# Patient Record
Sex: Female | Born: 1965 | Race: White | Hispanic: No | Marital: Married | Smoking: Never smoker
Health system: Southern US, Community
[De-identification: ages and names within clinical notes are randomized; demographics above are authoritative.]

## PROBLEM LIST (undated history)

## (undated) DIAGNOSIS — E039 Hypothyroidism, unspecified: Secondary | ICD-10-CM

## (undated) DIAGNOSIS — R112 Nausea with vomiting, unspecified: Secondary | ICD-10-CM

## (undated) DIAGNOSIS — G4733 Obstructive sleep apnea (adult) (pediatric): Secondary | ICD-10-CM

## (undated) DIAGNOSIS — E063 Autoimmune thyroiditis: Secondary | ICD-10-CM

## (undated) DIAGNOSIS — Z9889 Other specified postprocedural states: Secondary | ICD-10-CM

## (undated) DIAGNOSIS — R519 Headache, unspecified: Secondary | ICD-10-CM

## (undated) DIAGNOSIS — R51 Headache: Secondary | ICD-10-CM

## (undated) DIAGNOSIS — I10 Essential (primary) hypertension: Secondary | ICD-10-CM

## (undated) DIAGNOSIS — A4902 Methicillin resistant Staphylococcus aureus infection, unspecified site: Secondary | ICD-10-CM

## (undated) DIAGNOSIS — K219 Gastro-esophageal reflux disease without esophagitis: Secondary | ICD-10-CM

## (undated) HISTORY — DX: Hypothyroidism, unspecified: E03.9

## (undated) HISTORY — DX: Obstructive sleep apnea (adult) (pediatric): G47.33

## (undated) HISTORY — DX: Headache, unspecified: R51.9

## (undated) HISTORY — PX: TONSILLECTOMY: SUR1361

## (undated) HISTORY — DX: Headache: R51

## (undated) HISTORY — DX: Gastro-esophageal reflux disease without esophagitis: K21.9

## (undated) HISTORY — DX: Essential (primary) hypertension: I10

## (undated) HISTORY — DX: Methicillin resistant Staphylococcus aureus infection, unspecified site: A49.02

---

## 2003-07-08 HISTORY — PX: OTHER SURGICAL HISTORY: SHX169

## 2015-06-04 ENCOUNTER — Other Ambulatory Visit (HOSPITAL_BASED_OUTPATIENT_CLINIC_OR_DEPARTMENT_OTHER): Payer: Self-pay | Admitting: Physical Medicine and Rehabilitation

## 2015-06-04 DIAGNOSIS — M533 Sacrococcygeal disorders, not elsewhere classified: Secondary | ICD-10-CM

## 2015-06-20 ENCOUNTER — Ambulatory Visit (HOSPITAL_COMMUNITY)
Admission: RE | Admit: 2015-06-20 | Discharge: 2015-06-20 | Disposition: A | Discharge status: Home / Self Care | Payer: 59 | Source: Ambulatory Visit | Attending: Physical Medicine and Rehabilitation | Admitting: Physical Medicine and Rehabilitation

## 2015-06-20 DIAGNOSIS — M533 Sacrococcygeal disorders, not elsewhere classified: Secondary | ICD-10-CM | POA: Diagnosis not present

## 2015-06-20 DIAGNOSIS — M899 Disorder of bone, unspecified: Secondary | ICD-10-CM | POA: Diagnosis not present

## 2015-07-26 DIAGNOSIS — S8002XA Contusion of left knee, initial encounter: Secondary | ICD-10-CM | POA: Diagnosis not present

## 2015-08-01 ENCOUNTER — Other Ambulatory Visit (HOSPITAL_COMMUNITY): Payer: Self-pay | Admitting: Sports Medicine

## 2015-08-01 DIAGNOSIS — M25562 Pain in left knee: Secondary | ICD-10-CM

## 2015-08-02 ENCOUNTER — Ambulatory Visit (HOSPITAL_COMMUNITY)
Admission: RE | Admit: 2015-08-02 | Discharge: 2015-08-02 | Disposition: A | Discharge status: Home / Self Care | Payer: 59 | Source: Ambulatory Visit | Attending: Sports Medicine | Admitting: Sports Medicine

## 2015-08-03 DIAGNOSIS — W108XXA Fall (on) (from) other stairs and steps, initial encounter: Secondary | ICD-10-CM | POA: Diagnosis not present

## 2015-08-03 DIAGNOSIS — M79605 Pain in left leg: Secondary | ICD-10-CM | POA: Diagnosis not present

## 2015-08-03 DIAGNOSIS — M1612 Unilateral primary osteoarthritis, left hip: Secondary | ICD-10-CM | POA: Diagnosis not present

## 2015-08-03 DIAGNOSIS — M25562 Pain in left knee: Secondary | ICD-10-CM | POA: Diagnosis not present

## 2015-08-03 DIAGNOSIS — Z9889 Other specified postprocedural states: Secondary | ICD-10-CM | POA: Diagnosis not present

## 2015-08-03 DIAGNOSIS — W108XXD Fall (on) (from) other stairs and steps, subsequent encounter: Secondary | ICD-10-CM | POA: Diagnosis not present

## 2015-08-03 DIAGNOSIS — E079 Disorder of thyroid, unspecified: Secondary | ICD-10-CM | POA: Diagnosis not present

## 2015-08-03 DIAGNOSIS — M25561 Pain in right knee: Secondary | ICD-10-CM | POA: Diagnosis not present

## 2015-08-03 DIAGNOSIS — S8002XD Contusion of left knee, subsequent encounter: Secondary | ICD-10-CM | POA: Diagnosis not present

## 2015-08-03 DIAGNOSIS — S8002XA Contusion of left knee, initial encounter: Secondary | ICD-10-CM | POA: Diagnosis not present

## 2015-08-06 DIAGNOSIS — S83412A Sprain of medial collateral ligament of left knee, initial encounter: Secondary | ICD-10-CM | POA: Diagnosis not present

## 2015-08-06 DIAGNOSIS — M25561 Pain in right knee: Secondary | ICD-10-CM | POA: Diagnosis not present

## 2015-08-06 DIAGNOSIS — G8929 Other chronic pain: Secondary | ICD-10-CM | POA: Diagnosis not present

## 2015-08-06 DIAGNOSIS — M2392 Unspecified internal derangement of left knee: Secondary | ICD-10-CM | POA: Diagnosis not present

## 2015-08-06 DIAGNOSIS — M25562 Pain in left knee: Secondary | ICD-10-CM | POA: Diagnosis not present

## 2015-08-14 ENCOUNTER — Ambulatory Visit (HOSPITAL_COMMUNITY): Payer: 59

## 2015-09-07 DIAGNOSIS — M533 Sacrococcygeal disorders, not elsewhere classified: Secondary | ICD-10-CM | POA: Diagnosis not present

## 2015-10-04 MED FILL — SYNTHROID 175 MCG TABLET: 175 | 90 days supply | Qty: 45 | Fill #2

## 2015-10-04 MED FILL — SYNTHROID 200 MCG TABLET: 200 | 90 days supply | Qty: 45 | Fill #2

## 2015-10-08 ENCOUNTER — Other Ambulatory Visit (HOSPITAL_COMMUNITY): Payer: Self-pay | Admitting: Physical Medicine and Rehabilitation

## 2015-10-08 DIAGNOSIS — M533 Sacrococcygeal disorders, not elsewhere classified: Secondary | ICD-10-CM

## 2015-10-19 ENCOUNTER — Ambulatory Visit (HOSPITAL_COMMUNITY): Payer: 59

## 2015-10-26 ENCOUNTER — Ambulatory Visit (HOSPITAL_COMMUNITY)
Admission: RE | Admit: 2015-10-26 | Discharge: 2015-10-26 | Disposition: A | Discharge status: Home / Self Care | Payer: 59 | Source: Ambulatory Visit | Attending: Physical Medicine and Rehabilitation | Admitting: Physical Medicine and Rehabilitation

## 2015-10-26 DIAGNOSIS — M533 Sacrococcygeal disorders, not elsewhere classified: Secondary | ICD-10-CM | POA: Insufficient documentation

## 2015-10-26 MED ORDER — GADOBENATE DIMEGLUMINE 529 MG/ML IV SOLN
20.0000 mL | Freq: Once | INTRAVENOUS | Status: AC | PRN
  Administered 2015-10-26: 20 mL via INTRAVENOUS

## 2015-12-31 MED FILL — SYNTHROID 200 MCG TABLET: 200 | 90 days supply | Qty: 45 | Fill #3

## 2015-12-31 MED FILL — SYNTHROID 175 MCG TABLET: 175 | 90 days supply | Qty: 45 | Fill #3

## 2016-01-29 DIAGNOSIS — L089 Local infection of the skin and subcutaneous tissue, unspecified: Secondary | ICD-10-CM | POA: Diagnosis not present

## 2016-05-26 DIAGNOSIS — E039 Hypothyroidism, unspecified: Secondary | ICD-10-CM | POA: Diagnosis not present

## 2016-05-26 DIAGNOSIS — Z131 Encounter for screening for diabetes mellitus: Secondary | ICD-10-CM | POA: Diagnosis not present

## 2016-05-26 DIAGNOSIS — F458 Other somatoform disorders: Secondary | ICD-10-CM | POA: Diagnosis not present

## 2016-05-26 MED FILL — SYNTHROID 175 MCG TABLET: 175 | 30 days supply | Qty: 15 | Fill #0

## 2016-05-26 MED FILL — SYNTHROID 200 MCG TABLET: 200 | 30 days supply | Qty: 15 | Fill #0

## 2016-06-19 MED FILL — SYNTHROID 150 MCG TABLET: 150 | 30 days supply | Qty: 15 | Fill #0

## 2016-07-30 MED FILL — SYNTHROID 150 MCG TABLET: 150 | 30 days supply | Qty: 15 | Fill #1

## 2016-08-01 ENCOUNTER — Other Ambulatory Visit: Payer: Self-pay | Admitting: Otolaryngology

## 2016-08-01 DIAGNOSIS — R0683 Snoring: Secondary | ICD-10-CM | POA: Diagnosis not present

## 2016-08-01 DIAGNOSIS — R490 Dysphonia: Secondary | ICD-10-CM | POA: Diagnosis not present

## 2016-08-01 DIAGNOSIS — G4733 Obstructive sleep apnea (adult) (pediatric): Secondary | ICD-10-CM | POA: Diagnosis not present

## 2016-08-01 DIAGNOSIS — K210 Gastro-esophageal reflux disease with esophagitis: Secondary | ICD-10-CM | POA: Diagnosis not present

## 2016-08-01 DIAGNOSIS — E0789 Other specified disorders of thyroid: Secondary | ICD-10-CM

## 2016-08-01 DIAGNOSIS — J370 Chronic laryngitis: Secondary | ICD-10-CM | POA: Diagnosis not present

## 2016-08-11 ENCOUNTER — Ambulatory Visit
Admission: RE | Admit: 2016-08-11 | Discharge: 2016-08-11 | Disposition: A | Discharge status: Home / Self Care | Payer: 59 | Source: Ambulatory Visit | Attending: Otolaryngology | Admitting: Otolaryngology

## 2016-08-11 DIAGNOSIS — E0789 Other specified disorders of thyroid: Secondary | ICD-10-CM | POA: Diagnosis not present

## 2016-09-02 DIAGNOSIS — K210 Gastro-esophageal reflux disease with esophagitis: Secondary | ICD-10-CM | POA: Diagnosis not present

## 2016-09-02 DIAGNOSIS — J040 Acute laryngitis: Secondary | ICD-10-CM | POA: Diagnosis not present

## 2016-09-02 DIAGNOSIS — R490 Dysphonia: Secondary | ICD-10-CM | POA: Diagnosis not present

## 2016-09-12 MED FILL — SYNTHROID 150 MCG TABLET: 150 | 30 days supply | Qty: 15 | Fill #2

## 2016-09-26 ENCOUNTER — Encounter: Payer: Self-pay | Admitting: Endocrinology

## 2016-09-26 ENCOUNTER — Ambulatory Visit (INDEPENDENT_AMBULATORY_CARE_PROVIDER_SITE_OTHER): Payer: 59 | Admitting: Endocrinology

## 2016-09-26 DIAGNOSIS — E063 Autoimmune thyroiditis: Secondary | ICD-10-CM | POA: Diagnosis not present

## 2016-09-26 DIAGNOSIS — E038 Other specified hypothyroidism: Secondary | ICD-10-CM

## 2016-09-26 DIAGNOSIS — E039 Hypothyroidism, unspecified: Secondary | ICD-10-CM | POA: Insufficient documentation

## 2016-09-26 MED ORDER — LEVOTHYROXINE SODIUM 150 MCG PO TABS: 150.0000 ug | ORAL_TABLET | Freq: Every day | ORAL | 2 refills | Status: DC

## 2016-09-26 NOTE — Patient Instructions (Signed)
I have sent a prescription to your pharmacy, for the thyroid pill. In 1 month, please come in for the thyroid blood test.  I would be happy to see you back here as needed.

## 2016-09-26 NOTE — Progress Notes (Signed)
   Subjective:    Patient ID: Lindsay Wade, female    DOB: Dec 03, 1965, 51 y.o.   MRN: [id number]  HPI Pt is referred by Dr Ernesto Rutherford, for nodular thyroid.  Pt was first noted to have a goiter and hypothyroidism, both in 1999, when she lived in [place].  she has no h/o XRT or surgery to the neck.  She has slight fullness at the anterior neck, but no assoc pain.  She saw ENT 2 mos ago, who advised pt to come here to eval thyroid function.  She says she has been running low on synthroid, so she takes intermittently.   Past Medical History:  Diagnosis Date  . Hypothyroid     No past surgical history on file.  Social History   Social History  . Marital status: Married    Spouse name: N/A  . Number of children: N/A  . Years of education: N/A   Occupational History  . Not on file.   Social History Main Topics  . Smoking status: Not on file  . Smokeless tobacco: Not on file  . Alcohol use Not on file  . Drug use: Unknown  . Sexual activity: Not on file   Other Topics Concern  . Not on file   Social History Narrative  . No narrative on file    No current outpatient prescriptions on file prior to visit.   No current facility-administered medications on file prior to visit.     No Known Allergies  Family History  Problem Relation Age of Onset  . Thyroid disease Neg Hx     BP 136/84   Pulse 81   Ht 5' 6.5" (1.689 m)   Wt 234 lb (106.1 kg)   SpO2 98%   BMI 37.20 kg/m    Review of Systems Denies weight change, hoarseness, diplopia, chest pain, sob, cough, dysphagia, diarrhea, itching, easy bruising, depression, cold intolerance, headache, numbness, and rhinorrhea. She reports heartburn and flushing.    Objective:   Physical Exam VS: see vs page GEN: no distress HEAD: head: no deformity eyes: no periorbital swelling, no proptosis external nose and ears are normal mouth: no lesion seen NECK: supple, thyroid is not enlarged CHEST WALL: no deformity LUNGS: clear to  auscultation CV: reg rate and rhythm, no murmur ABD: abdomen is soft, nontender.  no hepatosplenomegaly.  not distended.  no hernia MUSCULOSKELETAL: muscle bulk and strength are grossly normal.  no obvious joint swelling.  gait is normal and steady EXTEMITIES: no deformity.  no edema.   PULSES: no carotid bruit NEURO:  cn 2-12 grossly intact.   readily moves all 4's.  sensation is intact to touch on all 4's SKIN:  Normal texture and temperature.  No rash or suspicious lesion is visible.   NODES:  None palpable at the neck PSYCH: alert, well-oriented.  Does not appear anxious nor depressed.    [place]: Thyroid tissue is diffusely heterogeneous.  No discrete nodules     Assessment & Plan:  Chronic thyroiditis, new to me Hypothyroidism, due to thyroiditis.  She needs a med refill, then f/u [facility].  Slight goiter, also due to thyroiditis.  Patient is advised the following: Patient Instructions  I have sent a prescription to your pharmacy, for the thyroid pill. In 1 month, please come in for the thyroid blood test.  I would be happy to see you back here as needed.

## 2016-10-01 MED FILL — SYNTHROID 150 MCG TABLET: 150 | 30 days supply | Qty: 30 | Fill #0

## 2016-10-29 ENCOUNTER — Other Ambulatory Visit: Payer: 59

## 2016-11-06 MED FILL — SYNTHROID 150 MCG TABLET: 150 | 30 days supply | Qty: 30 | Fill #1

## 2016-12-12 DIAGNOSIS — H524 Presbyopia: Secondary | ICD-10-CM | POA: Diagnosis not present

## 2016-12-16 MED FILL — SYNTHROID 150 MCG TABLET: 150 | 30 days supply | Qty: 30 | Fill #2

## 2017-01-14 ENCOUNTER — Telehealth: Payer: Self-pay | Admitting: Endocrinology

## 2017-01-14 DIAGNOSIS — E038 Other specified hypothyroidism: Secondary | ICD-10-CM

## 2017-01-14 DIAGNOSIS — E063 Autoimmune thyroiditis: Principal | ICD-10-CM

## 2017-01-14 MED ORDER — LEVOTHYROXINE SODIUM 150 MCG PO TABS: 150.0000 ug | ORAL_TABLET | Freq: Every day | ORAL | 0 refills | Status: DC

## 2017-01-14 MED FILL — SYNTHROID 150 MCG TABLET: 150 | 30 days supply | Qty: 30 | Fill #0

## 2017-01-14 NOTE — Telephone Encounter (Signed)
I contacted the patient and advised orders have been placed and refill has been submitted via voicemail.

## 2017-01-14 NOTE — Telephone Encounter (Signed)
See message and please advise, Thanks!  

## 2017-01-14 NOTE — Telephone Encounter (Signed)
Patient called to advise that she is two pills away from being out of levothyroxine (SYNTHROID, LEVOTHROID) 150 MCG tablet [[id number]] . She is asking if we can call a temporary script in to [facility].   She asks if we can give her a TSH order to get at [facility] due to her distance from the clinic.  Verified home # and sent a text for mychart. She stated that she would get mychart as well.

## 2017-01-14 NOTE — Telephone Encounter (Signed)
Ok, I ordered to be done at [facility]

## 2017-02-12 ENCOUNTER — Telehealth: Payer: Self-pay | Admitting: Endocrinology

## 2017-02-12 NOTE — Telephone Encounter (Signed)
Patient called in reference to levothyroxine (SYNTHROID, LEVOTHROID) 150 MCG tablet. Patient needs labs done before getting Rx filled again. Patient stated she is almost out of the Rx. Patient has orders in to have labs done at this location but stated it is too far for her to drive and she would like someone to contact her in reference to going elsewhere for labs. Please call patient and advise. OK to leave message.

## 2017-02-13 NOTE — Telephone Encounter (Signed)
Routing to you °

## 2017-02-13 NOTE — Telephone Encounter (Signed)
Called and left patient a VM stating that she could go to any [place] location that's more convenient for her to have her labs drawn.

## 2017-02-18 ENCOUNTER — Other Ambulatory Visit (INDEPENDENT_AMBULATORY_CARE_PROVIDER_SITE_OTHER): Payer: 59

## 2017-02-18 ENCOUNTER — Other Ambulatory Visit: Payer: Self-pay | Admitting: Endocrinology

## 2017-02-18 DIAGNOSIS — E063 Autoimmune thyroiditis: Secondary | ICD-10-CM | POA: Diagnosis not present

## 2017-02-18 DIAGNOSIS — E038 Other specified hypothyroidism: Secondary | ICD-10-CM

## 2017-02-18 LAB — TSH: TSH: 0.47 u[IU]/mL (ref 0.35–4.50)

## 2017-02-18 LAB — T3, FREE: T3, Free: 3 pg/mL (ref 2.3–4.2)

## 2017-02-18 LAB — T4, FREE: Free T4: 1.22 ng/dL (ref 0.60–1.60)

## 2017-02-20 ENCOUNTER — Telehealth: Payer: Self-pay | Admitting: Endocrinology

## 2017-02-20 ENCOUNTER — Other Ambulatory Visit: Payer: Self-pay

## 2017-02-20 MED ORDER — LEVOTHYROXINE SODIUM 150 MCG PO TABS: 150.0000 ug | ORAL_TABLET | Freq: Every day | ORAL | 4 refills | Status: DC

## 2017-02-20 MED FILL — SYNTHROID 150 MCG TABLET: 150 | 90 days supply | Qty: 90 | Fill #0

## 2017-02-20 NOTE — Telephone Encounter (Signed)
Called patient and sent in her prescription for 150 mcg daily of levothyroxine in 90 day supply to pharmacy.

## 2017-02-20 NOTE — Telephone Encounter (Signed)
Patient has questions about her levothyroxine (SYNTHROID, LEVOTHROID) 150 MCG tablet and the level that she is on. Patient used to take 175 MCG. Call patient to advise, she is completely out. Patient would like to use the pharmacy below:  [place], [place] - [address] [phone] (Phone) [phone] (Fax)

## 2017-02-20 NOTE — Telephone Encounter (Signed)
Routing to you °

## 2017-02-20 NOTE — Telephone Encounter (Signed)
We have pt's current dosage at 150 mcg/d.  Please verify with patient.  Then please refill prn

## 2017-03-13 ENCOUNTER — Encounter (HOSPITAL_COMMUNITY): Payer: Self-pay | Admitting: *Deleted

## 2017-03-13 ENCOUNTER — Emergency Department (HOSPITAL_COMMUNITY)
Admission: EM | Admit: 2017-03-13 | Discharge: 2017-03-13 | Disposition: A | Discharge status: Home / Self Care | Payer: 59 | Attending: Emergency Medicine | Admitting: Emergency Medicine

## 2017-03-13 ENCOUNTER — Emergency Department (HOSPITAL_COMMUNITY): Payer: 59

## 2017-03-13 DIAGNOSIS — R1310 Dysphagia, unspecified: Secondary | ICD-10-CM | POA: Diagnosis not present

## 2017-03-13 DIAGNOSIS — E039 Hypothyroidism, unspecified: Secondary | ICD-10-CM | POA: Insufficient documentation

## 2017-03-13 DIAGNOSIS — Z79899 Other long term (current) drug therapy: Secondary | ICD-10-CM | POA: Diagnosis not present

## 2017-03-13 DIAGNOSIS — R221 Localized swelling, mass and lump, neck: Secondary | ICD-10-CM | POA: Diagnosis not present

## 2017-03-13 MED ORDER — ESOMEPRAZOLE MAGNESIUM 40 MG PO CPDR: 40.0000 mg | DELAYED_RELEASE_CAPSULE | Freq: Every day | ORAL | 0 refills | Status: DC

## 2017-03-13 NOTE — ED Provider Notes (Signed)
4:53 PM Patient seen in conjunction with [facility]. Patient complains of fullness sensation in her neck that was worse last night and this morning causing her to leave work. It is worse with swallowing, however she denies any regurgitation. No lip or tongue swelling, skin rashes or hives, vomiting or diarrhea. She has had previous thyroid ultrasound and laryngoscopy which were largely unrevealing. She has tried PPIs without help, but is currently not on one. She does not report any facial swelling.   Reviewed with patient her negative x-ray findings today and what we would be looking for on them. Patient has not had any sort of swallow eval or esophageal evaluation. She may benefit from follow-up with the gastroenterologist. Encouraged patient to trial PPI until she is able to follow-up.  Do not feel further workup is indicated given the patient is handling her secretions. She has no fever or risk factors for retropharyngeal abscess. No signs of epiglottitis on exam or by x-ray nd she does not really report any pain. Good range of motion of neck without deficit. Patient does seem very anxious about this.  Encouraged return if she has worsening symptoms, regurgitation or inability to swallow.   BP (!) 145/106 (BP Location: Right Arm)   Pulse 80   Temp 98.5 F (36.9 C)   Resp 16   Ht 5\' 6"  (1.676 m)   Wt 108.9 kg (240 lb)   SpO2 99%   BMI 38.74 kg/m     Carlisle Cater, PA-C 03/13/17 1657    Sherwood Gambler, MD 03/19/17 [phone]

## 2017-03-13 NOTE — ED Provider Notes (Signed)
[facility] DEPT Provider Note   CSN: [id number] Arrival date & time: 03/13/17  1056     History   Chief Complaint Chief Complaint  Lindsay Wade presents with  . Dysphagia    HPI Lindsay Wade is a 51 y.o. female.  HPI  Lindsay Wade is a 51 year old female with a history of hypothyroidism and GERD presenting for progressive feeling of globus and fullness in the neck since January 2018. Lindsay Wade reports that she reached a point today where she was concerned about her breathing because of the progressive sensation of globus. Lindsay Wade reports she feels a tightness and a smothering feeling particularly if she were to lie down with the sensation in her throat. Lindsay Wade has tried multiple remedies for GERD to relieve her symptoms, including PPI, dietary changes, lifestyle changes and continues to have this feeling. Lindsay Wade denies pain with swallowing, choking, airway obstruction, regurgitation, abdominal pain, nausea, or vomiting. Lindsay Wade reports that she has been evaluated throughout January to March 2018 by ear nose and throat. Lindsay Wade has undergone thyroid ultrasound which showed no nodules or abnormalities of the thyroid. Lindsay Wade reports that the nose and throat physician perform direct laryngoscopy, which showed no abnormalities. Lindsay Wade is particular concerned about possible tumors in her head and neck that could be causing her symptoms.   Past Medical History:  Diagnosis Date  . Hypothyroid     Lindsay Wade Active Problem List   Diagnosis Date Noted  . Hypothyroid     Past Surgical History:  Procedure Laterality Date  . CESAREAN SECTION    . TONSILLECTOMY      OB History    No data available       Home Medications    Prior to Admission medications   Medication Sig Start Date End Date Taking? Authorizing Provider  levothyroxine (SYNTHROID, LEVOTHROID) 150 MCG tablet Take 1 tablet (150 mcg total) by mouth daily before breakfast. 02/20/17  Yes Renato Shin, MD  esomeprazole (NEXIUM)  40 MG capsule Take 1 capsule (40 mg total) by mouth daily. 03/13/17   Albesa Seen, PA-C    Family History Family History  Problem Relation Age of Onset  . Thyroid disease Neg Hx     Social History Social History  Substance Use Topics  . Smoking status: Never Smoker  . Smokeless tobacco: Never Used  . Alcohol use No     Allergies   Lindsay Wade has no known allergies.   Review of Systems Review of Systems  Constitutional: Negative for chills and fever.  HENT: Positive for trouble swallowing. Negative for congestion, rhinorrhea, sinus pain, sore throat and voice change.   Eyes: Negative for visual disturbance.  Respiratory: Negative for cough, chest tightness and shortness of breath.   Cardiovascular: Negative for chest pain, palpitations and leg swelling.  Gastrointestinal: Negative for abdominal pain, diarrhea, nausea and vomiting.  Genitourinary: Negative for dysuria and flank pain.  Musculoskeletal: Negative for back pain and myalgias.  Skin: Negative for rash.  Neurological: Positive for headaches. Negative for light-headedness.     Physical Exam Updated Vital Signs BP (!) 145/106 (BP Location: Right Arm)   Pulse 80   Temp 98.5 F (36.9 C)   Resp 16   Ht 5\' 6"  (1.676 m)   Wt 108.9 kg (240 lb)   SpO2 99%   BMI 38.74 kg/m   Physical Exam  Constitutional: She appears well-developed and well-nourished. No distress.  HENT:  Head: Normocephalic and atraumatic.  Mouth/Throat: Oropharynx is clear and moist.  No lesions, masses, or erythema noted  in the posterior oropharynx.  Eyes: Pupils are equal, round, and reactive to light. Conjunctivae and EOM are normal.  Neck: Normal range of motion. Neck supple.  No neck masses appreciated.  Cardiovascular: Normal rate, regular rhythm, S1 normal and S2 normal.   No murmur heard. Pulmonary/Chest: Effort normal and breath sounds normal. She has no wheezes. She has no rales.  Abdominal: Soft. She exhibits no distension.  There is no tenderness. There is no guarding.  Musculoskeletal: Normal range of motion. She exhibits no edema or deformity.  Lymphadenopathy:    She has no cervical adenopathy.  Neurological: She is alert.  Cranial nerves grossly intact. Coordination of limbs and movement without difficulty.  Skin: Skin is warm and dry. No rash noted. No erythema.  Psychiatric: She has a normal mood and affect. Her behavior is normal. Judgment and thought content normal.  Nursing note and vitals reviewed.    ED Treatments / Results  Labs (all labs ordered are listed, but only abnormal results are displayed) Labs Reviewed - No data to display  EKG  EKG Interpretation None       Radiology Dg Neck Soft Tissue  Result Date: 03/13/2017 CLINICAL DATA:  Sensation of a lump in the throat for the past 2 years, worsened today after eating a banana. She also has pain in her throat which worsens when she is lying back or lying down on her side. EXAM: NECK SOFT TISSUES - 1+ VIEW COMPARISON:  None. FINDINGS: There is no evidence of retropharyngeal soft tissue swelling or epiglottic enlargement. The cervical airway is unremarkable and no radio-opaque foreign body identified. IMPRESSION: Normal examination. Electronically Signed   By: Claudie Revering M.D.   On: 03/13/2017 15:57   Dg Chest 2 View  Result Date: 03/13/2017 CLINICAL DATA:  Dysphagia.  Lump in the throat for the past 2 years. EXAM: CHEST  2 VIEW COMPARISON:  None. FINDINGS: The heart size and mediastinal contours are within normal limits. Both lungs are clear. The visualized skeletal structures are unremarkable. IMPRESSION: Normal examination. Electronically Signed   By: Claudie Revering M.D.   On: 03/13/2017 15:56    Procedures Procedures (including critical care time)  Medications Ordered in ED Medications - No data to display   Initial Impression / Assessment and Plan / ED Course  I have reviewed the triage vital signs and the nursing  notes.  Pertinent labs & imaging results that were available during my care of the Lindsay Wade were reviewed by me and considered in my medical decision making (see chart for details).     Final Clinical Impressions(s) / ED Diagnoses   Final diagnoses:  Dysphagia  Dysphagia, unspecified type   MDM  Lindsay Wade is a 51 year old female with a history of hypothyroidism and GERD presenting for progressive feeling of globus and fullness in the neck since January 2018. Lateral neck x-ray and chest x-ray performed in emergency department today and showed no soft tissue abnormalities or compromise of the airway. Vital signs stable and continuous pulse oximetry demonstrates no saturations. Lindsay Wade is well appearing, breathing comfortably, handling secretions, and demonstrates no signs of respiratory distress such as stridor or tachypnea. Lindsay Wade has been repeatedly evaluated by otolaryngology for complaints. Lindsay Wade reassured and referred to gastroenterology out of the department today for potential GERD management and endoscopy if indicated. Lindsay Wade given prescription for Nexium 40 mg daily to take in the meantime prior to her gastroenterology appointment.  Lindsay Wade encouraged to follow up again with otolaryngology. Lindsay Wade family are understanding and  agree with plan of care.  Nursing notes reviewed. Vital signs reviewed. All questions answered by Lindsay Wade and family.  New Prescriptions Discharge Medication List as of 03/13/2017  5:04 PM       Tamala Julian 03/13/17 Monte Fantasia, MD 03/19/17 [phone]

## 2017-03-13 NOTE — ED Notes (Signed)
Pt verbalized understanding discharge instructions and denies any further needs or questions at this time. VS stable, ambulatory and steady gait.   

## 2017-03-13 NOTE — Discharge Instructions (Signed)
Please see the information and instructions below regarding your visit.  Your diagnoses today include:  1. Dysphagia, unspecified type   2. Dysphagia    Your exam and x-rays performed today were reassuring that there is no soft tissue swelling compromising your airway. We would like this to continue to be followed by ENT and a gastroenterologist. He may use the information you already have to contact an ENT of you choosing and we provided a referral for gastroenterology at this time.  Tests performed today include: 1. Soft tissue neck x-ray 2. Chest x-ray  Please see the paperwork in this packet for reads of your imaging.  See side panel of your discharge paperwork for testing performed today. Vital signs are listed at the bottom of these instructions.   Medications prescribed:    Please try Nexium 40 mg daily for at least 2 weeks prior to visiting a gastroenterologist. Take any prescribed medications only as prescribed, and any over the counter medications only as directed on the packaging.  Home care instructions:  Please follow any educational materials contained in this packet.   Follow-up instructions: Please follow up with ENT and gastroenterology as soon as possible.  Return instructions:  Please return to the Emergency Department if you experience worsening symptoms.  Please return for any difficulty breathing, or inability to keep down any food or drink.  Please return if you have any other emergent concerns.   Your vital signs today were: BP (!) 116/100    Pulse 81    Temp 98.5 F (36.9 C)    Resp 16    Ht 5\' 6"  (1.676 m)    Wt 108.9 kg (240 lb)    SpO2 96%    BMI 38.74 kg/m  If your blood pressure (BP) was elevated on multiple readings during this visit above 130 for the top number or above 80 for the bottom number, please have this repeated by your primary care provider within one month. --------------  Thank you for allowing [place] to participate in your care today.

## 2017-03-13 NOTE — ED Triage Notes (Signed)
Pt c/o sensation of fullness in neck ongoing x 3 yrs., pt had [place] & endoscopy this March 2018, pt reports feeling unable to swallow, pt has control of secretions, airway intact, pt reports hoarse voice, pt states, "I had trouble swallowing my food this morning." A&O x4, airway intact

## 2017-03-16 MED FILL — ESOMEPRAZOLE MAG DR 40 MG C: 40 | 30 days supply | Qty: 30 | Fill #0

## 2017-03-17 ENCOUNTER — Other Ambulatory Visit: Payer: Self-pay | Admitting: Gastroenterology

## 2017-03-17 DIAGNOSIS — K219 Gastro-esophageal reflux disease without esophagitis: Secondary | ICD-10-CM | POA: Diagnosis not present

## 2017-03-17 DIAGNOSIS — R1310 Dysphagia, unspecified: Secondary | ICD-10-CM

## 2017-03-17 NOTE — Progress Notes (Signed)
Garvey Westcott MD 

## 2017-03-23 ENCOUNTER — Ambulatory Visit
Admission: RE | Admit: 2017-03-23 | Discharge: 2017-03-23 | Disposition: A | Discharge status: Home / Self Care | Payer: 59 | Source: Ambulatory Visit | Attending: Gastroenterology | Admitting: Gastroenterology

## 2017-03-23 DIAGNOSIS — R1310 Dysphagia, unspecified: Secondary | ICD-10-CM | POA: Diagnosis not present

## 2017-04-06 ENCOUNTER — Ambulatory Visit (INDEPENDENT_AMBULATORY_CARE_PROVIDER_SITE_OTHER): Payer: 59 | Admitting: Family Medicine

## 2017-04-06 ENCOUNTER — Ambulatory Visit: Payer: 59 | Admitting: Family Medicine

## 2017-04-06 ENCOUNTER — Encounter: Payer: Self-pay | Admitting: Family Medicine

## 2017-04-06 VITALS — BP 172/112 | HR 76 | Temp 98.3°F | Ht 66.0 in | Wt 243.4 lb

## 2017-04-06 DIAGNOSIS — I10 Essential (primary) hypertension: Secondary | ICD-10-CM | POA: Diagnosis not present

## 2017-04-06 DIAGNOSIS — Z23 Encounter for immunization: Secondary | ICD-10-CM

## 2017-04-06 MED ORDER — AMLODIPINE BESYLATE 5 MG PO TABS: 5.0000 mg | ORAL_TABLET | Freq: Every day | ORAL | 2 refills | Status: DC

## 2017-04-06 MED FILL — AMLODIPINE BESYLATE 5 MG TA: 5 | 90 days supply | Qty: 90 | Fill #0

## 2017-04-06 NOTE — Progress Notes (Signed)
Chief Complaint  Patient presents with  . Establish Care       New Patient Visit SUBJECTIVE: HPI: Lindsay Wade is an 51 y.o.female who is being seen for establishing care.  HTN Has checked her BP away from office and it was elevated. It was high when she was at ED visit and GI's office. Sleep study 2016 and was normal. Wt [employer]. Physically active at home and walks. Both of her parents have high blood pressure. She denies any chest pain or shortness of breath. She has never been on high blood pressure medicine before.  No Known Allergies  Past Medical History:  Diagnosis Date  . Hypothyroid   . MRSA infection    Past Surgical History:  Procedure Laterality Date  . CESAREAN SECTION    . TONSILLECTOMY     Social History   Social History  . Marital status: Married   Social History Main Topics  . Smoking status: Never Smoker  . Smokeless tobacco: Never Used  . Alcohol use No  . Drug use: No   Family History  Problem Relation Age of Onset  . Hypertension Mother   . Hypertension Father   . Hyperlipidemia Sister   . Thyroid disease Neg Hx     Current Outpatient Prescriptions:  .  levothyroxine (SYNTHROID, LEVOTHROID) 150 MCG tablet, Take 1 tablet (150 mcg total) by mouth daily before breakfast., Disp: 90 tablet, Rfl: 4 .  amLODipine (NORVASC) 5 MG tablet, Take 1 tablet (5 mg total) by mouth daily., Disp: 30 tablet, Rfl: 2 .  esomeprazole (NEXIUM) 40 MG capsule, Take 1 capsule (40 mg total) by mouth daily. (Patient not taking: Reported on 04/06/2017), Disp: 30 capsule, Rfl: 0  No LMP recorded. Patient has had an ablation.  ROS Cardiovascular: Denies chest pain  Respiratory: Denies dyspnea   OBJECTIVE: BP (!) 172/112 (BP Location: Right Arm, Patient Position: Sitting, Cuff Size: Large)   Pulse 76   Temp 98.3 F (36.8 C) (Oral)   Ht 5\' 6"  (1.676 m)   Wt 243 lb 6 oz (110.4 kg)   SpO2 98%   BMI 39.28 kg/m   Constitutional: -  VS reviewed -  Well developed,  well nourished, appears stated age -  No apparent distress  Psychiatric: -  Oriented to person, place, and time -  Memory intact -  Affect and mood normal -  Fluent conversation, good eye contact -  Judgment and insight age appropriate  Eye: -  Conjunctivae clear, no discharge -  Pupils symmetric, round, reactive to light  ENMT: -  MMM    Pharynx moist, no exudate, no erythema  Neck: -  No gross swelling, no palpable masses -  Thyroid midline, not enlarged, mobile, no palpable masses  Cardiovascular: -  RRR -  No bruits -  No LE edema  Respiratory: -  Normal respiratory effort, no accessory muscle use, no retraction -  Breath sounds equal, no wheezes, no ronchi, no crackles  Musculoskeletal: -  No clubbing, no cyanosis -  Gait normal  Skin: -  No significant lesion on inspection -  Warm and dry to palpation   ASSESSMENT/PLAN: Essential hypertension - Plan: amLODipine (NORVASC) 5 MG tablet  Need for influenza vaccination - Plan: Flu Vaccine QUAD 6+ mos PF IM (Fluarix Quad PF)  Patient instructed to sign release of records form from her previous PCP. Counseled on diet and exercise. DASH handout given. She is likely fighting against genetics as well. Start amlodipine. If no improvement, will  consider going up on dose vs adding ACEi/ARB. She has a significant specialty co-pay, I offered to manage her hypothyroidism. Patient should return in 2 weeks to recheck BP. The patient voiced understanding and agreement to the plan.   [place], DO 04/06/17  2:53 PM

## 2017-04-06 NOTE — Patient Instructions (Addendum)
Around 3 times per week, check your blood pressure 4 times per day. Twice in the morning and twice in the evening. The readings should be at least one minute apart. Write down these values and bring them to your next nurse visit/appointment.  When you check your BP, make sure you have been doing something calm/relaxing 5 minutes prior to checking. Both feet should be flat on the floor and you should be sitting. Use your left arm and make sure it is in a relaxed position (on a table), and that the cuff is at the approximate level/height of your heart.  Try to get an upper arm cuff if possible. Omron is a brand that has a good idea.   Chest pain or shortness of breath are things you should seek immediate care for.   DASH Eating Plan DASH stands for "Dietary Approaches to Stop Hypertension." The DASH eating plan is a healthy eating plan that has been shown to reduce high blood pressure (hypertension). It may also reduce your risk for type 2 diabetes, heart disease, and stroke. The DASH eating plan may also help with weight loss. What are tips for following this plan? General guidelines  Avoid eating more than 2,300 mg (milligrams) of salt (sodium) a day. If you have hypertension, you may need to reduce your sodium intake to 1,500 mg a day.  Limit alcohol intake to no more than 1 drink a day for nonpregnant women and 2 drinks a day for men. One drink equals 12 oz of beer, 5 oz of wine, or 1 oz of hard liquor.  Work with your health care provider to maintain a healthy body weight or to lose weight. Ask what an ideal weight is for you.  Get at least 30 minutes of exercise that causes your heart to beat faster (aerobic exercise) most days of the week. Activities may include walking, swimming, or biking.  Work with your health care provider or diet and nutrition specialist (dietitian) to adjust your eating plan to your individual calorie needs. Reading food labels  Check food labels for the amount  of sodium per serving. Choose foods with less than 5 percent of the Daily Value of sodium. Generally, foods with less than 300 mg of sodium per serving fit into this eating plan.  To find whole grains, look for the word "whole" as the first word in the ingredient list. Shopping  Buy products labeled as "low-sodium" or "no salt added."  Buy fresh foods. Avoid canned foods and premade or frozen meals. Cooking  Avoid adding salt when cooking. Use salt-free seasonings or herbs instead of table salt or sea salt. Check with your health care provider or pharmacist before using salt substitutes.  Do not fry foods. Cook foods using healthy methods such as baking, boiling, grilling, and broiling instead.  Cook with heart-healthy oils, such as olive, canola, soybean, or sunflower oil. Meal planning   Eat a balanced diet that includes: ? 5 or more servings of fruits and vegetables each day. At each meal, try to fill half of your plate with fruits and vegetables. ? Up to 6-8 servings of whole grains each day. ? Less than 6 oz of lean meat, poultry, or fish each day. A 3-oz serving of meat is about the same size as a deck of cards. One egg equals 1 oz. ? 2 servings of low-fat dairy each day. ? A serving of nuts, seeds, or beans 5 times each week. ? Heart-healthy fats. Healthy fats called  Omega-3 fatty acids are found in foods such as flaxseeds and coldwater fish, like sardines, salmon, and mackerel.  Limit how much you eat of the following: ? Canned or prepackaged foods. ? Food that is high in trans fat, such as fried foods. ? Food that is high in saturated fat, such as fatty meat. ? Sweets, desserts, sugary drinks, and other foods with added sugar. ? Full-fat dairy products.  Do not salt foods before eating.  Try to eat at least 2 vegetarian meals each week.  Eat more home-cooked food and less restaurant, buffet, and fast food.  When eating at a restaurant, ask that your food be prepared  with less salt or no salt, if possible. What foods are recommended? The items listed may not be a complete list. Talk with your dietitian about what dietary choices are best for you. Grains Whole-grain or whole-wheat bread. Whole-grain or whole-wheat pasta. Brown rice. Modena Morrow. Bulgur. Whole-grain and low-sodium cereals. Pita bread. Low-fat, low-sodium crackers. Whole-wheat flour tortillas. Vegetables Fresh or frozen vegetables (raw, steamed, roasted, or grilled). Low-sodium or reduced-sodium tomato and vegetable juice. Low-sodium or reduced-sodium tomato sauce and tomato paste. Low-sodium or reduced-sodium canned vegetables. Fruits All fresh, dried, or frozen fruit. Canned fruit in natural juice (without added sugar). Meat and other protein foods Skinless chicken or [place]. Ground chicken or [place]. Pork with fat trimmed off. Fish and seafood. Egg whites. Dried beans, peas, or lentils. Unsalted nuts, nut butters, and seeds. Unsalted canned beans. Lean cuts of beef with fat trimmed off. Low-sodium, lean deli meat. Dairy Low-fat (1%) or fat-free (skim) milk. Fat-free, low-fat, or reduced-fat cheeses. Nonfat, low-sodium ricotta or cottage cheese. Low-fat or nonfat yogurt. Low-fat, low-sodium cheese. Fats and oils Soft margarine without trans fats. Vegetable oil. Low-fat, reduced-fat, or light mayonnaise and salad dressings (reduced-sodium). Canola, safflower, olive, soybean, and sunflower oils. Avocado. Seasoning and other foods Herbs. Spices. Seasoning mixes without salt. Unsalted popcorn and pretzels. Fat-free sweets. What foods are not recommended? The items listed may not be a complete list. Talk with your dietitian about what dietary choices are best for you. Grains Baked goods made with fat, such as croissants, muffins, or some breads. Dry pasta or rice meal packs. Vegetables Creamed or fried vegetables. Vegetables in a cheese sauce. Regular canned vegetables (not low-sodium or  reduced-sodium). Regular canned tomato sauce and paste (not low-sodium or reduced-sodium). Regular tomato and vegetable juice (not low-sodium or reduced-sodium). Angie Fava. Olives. Fruits Canned fruit in a light or heavy syrup. Fried fruit. Fruit in cream or butter sauce. Meat and other protein foods Fatty cuts of meat. Ribs. Fried meat. Berniece Salines. Sausage. Bologna and other processed lunch meats. Salami. Fatback. Hotdogs. Bratwurst. Salted nuts and seeds. Canned beans with added salt. Canned or smoked fish. Whole eggs or egg yolks. Chicken or [place] with skin. Dairy Whole or 2% milk, cream, and half-and-half. Whole or full-fat cream cheese. Whole-fat or sweetened yogurt. Full-fat cheese. Nondairy creamers. Whipped toppings. Processed cheese and cheese spreads. Fats and oils Butter. Stick margarine. Lard. Shortening. Ghee. Bacon fat. Tropical oils, such as coconut, palm kernel, or palm oil. Seasoning and other foods Salted popcorn and pretzels. Onion salt, garlic salt, seasoned salt, table salt, and sea salt. Worcestershire sauce. Tartar sauce. Barbecue sauce. Teriyaki sauce. Soy sauce, including reduced-sodium. Steak sauce. Canned and packaged gravies. Fish sauce. Oyster sauce. Cocktail sauce. Horseradish that you find on the shelf. Ketchup. Mustard. Meat flavorings and tenderizers. Bouillon cubes. Hot sauce and Tabasco sauce. Premade or packaged marinades. Premade  or packaged taco seasonings. Relishes. Regular salad dressings. Where to find more information:  National Heart, Lung, and [place]: https://wilson-eaton.com/  American Heart Association: www.heart.org Summary  The DASH eating plan is a healthy eating plan that has been shown to reduce high blood pressure (hypertension). It may also reduce your risk for type 2 diabetes, heart disease, and stroke.  With the DASH eating plan, you should limit salt (sodium) intake to 2,300 mg a day. If you have hypertension, you may need to reduce your sodium  intake to 1,500 mg a day.  When on the DASH eating plan, aim to eat more fresh fruits and vegetables, whole grains, lean proteins, low-fat dairy, and heart-healthy fats.  Work with your health care provider or diet and nutrition specialist (dietitian) to adjust your eating plan to your individual calorie needs. This information is not intended to replace advice given to you by your health care provider. Make sure you discuss any questions you have with your health care provider. Document Released: 06/12/2011 Document Revised: 06/16/2016 Document Reviewed: 06/16/2016 Elsevier Interactive Patient Education  2017 [employer].

## 2017-04-06 NOTE — Progress Notes (Signed)
Pre visit review using our clinic review tool, if applicable. No additional management support is needed unless otherwise documented below in the visit note. 

## 2017-04-07 ENCOUNTER — Telehealth: Payer: Self-pay | Admitting: Family Medicine

## 2017-04-07 NOTE — Telephone Encounter (Signed)
Follow up call made to patient. States she received a call from triage nurse earlier. States she was told to give medication a few more days. Advised to call or go to ED if snot feeling any better. Patient agreed.

## 2017-04-07 NOTE — Telephone Encounter (Signed)
Patient Name: Lindsay Wade  DOB: Mar 08, 1966    Initial Comment Caller states she was DX with high blood pressure yesterday. Headache- 155/112   Nurse Assessment  Nurse: Raphael Gibney, RN, Vera Date/Time (Eastern Time): 04/07/2017 1:35:10 PM  Confirm and document reason for call. If symptomatic, describe symptoms. ---Caller states she saw Dr. [name] yesterday. BP 172/112 in the office yesterday. She was prescribed Norvasc. BP 147/100 when she woke up. then it was 172/116 and 160/86. Went to work. Has a severe headache. BP 150/109 now.  Does the patient have any new or worsening symptoms? ---Yes  Will a triage be completed? ---Yes  Related visit to physician within the last 2 weeks? ---Yes  Does the PT have any chronic conditions? (i.e. diabetes, asthma, etc.) ---Yes  List chronic conditions. ---HTN  Is this a behavioral health or substance abuse call? ---No     Guidelines    Guideline Title Affirmed Question Affirmed Notes  Headache [1] SEVERE headache (e.g., excruciating) AND [2] not improved after 2 hours of pain medicine    Final Disposition User   See Physician within 4 Hours (or PCP triage) Raphael Gibney, RN, Vera    Comments  pt states she has to go back to work and she has had a severe headache since last week.   Referrals  GO TO FACILITY REFUSED   Caller Disagree/Comply Disagree  Caller Understands Yes  PreDisposition Call Doctor

## 2017-04-09 NOTE — Telephone Encounter (Signed)
Heat. Tylenol. Ibuprofen. Try to stretch neck muscles. We can see her tomorrow (10.5) if still having issues. TY.

## 2017-04-09 NOTE — Telephone Encounter (Addendum)
Patient is still having a headache, is there anything she can do? Call back on home [phone]

## 2017-04-10 ENCOUNTER — Telehealth: Payer: Self-pay | Admitting: *Deleted

## 2017-04-10 DIAGNOSIS — R1310 Dysphagia, unspecified: Secondary | ICD-10-CM | POA: Diagnosis not present

## 2017-04-10 DIAGNOSIS — K219 Gastro-esophageal reflux disease without esophagitis: Secondary | ICD-10-CM | POA: Diagnosis not present

## 2017-04-10 NOTE — Telephone Encounter (Signed)
Received Medical records from [facility], Dr. Ernesto Rutherford; forwarded to provider/SLS 10/05

## 2017-04-10 NOTE — Telephone Encounter (Signed)
Informed the patient of PCP instructions. She verbalized agreement/understanding. She states she would come in today but is on her way to get an endoscopy. But will call early next week if still having problems

## 2017-04-15 ENCOUNTER — Encounter: Payer: Self-pay | Admitting: Family Medicine

## 2017-04-15 ENCOUNTER — Ambulatory Visit (INDEPENDENT_AMBULATORY_CARE_PROVIDER_SITE_OTHER): Payer: 59 | Admitting: Family Medicine

## 2017-04-15 VITALS — BP 142/98 | HR 71 | Temp 98.5°F | Ht 66.0 in | Wt 241.1 lb

## 2017-04-15 DIAGNOSIS — R51 Headache: Secondary | ICD-10-CM

## 2017-04-15 DIAGNOSIS — I10 Essential (primary) hypertension: Secondary | ICD-10-CM | POA: Diagnosis not present

## 2017-04-15 DIAGNOSIS — R519 Headache, unspecified: Secondary | ICD-10-CM

## 2017-04-15 HISTORY — DX: Essential (primary) hypertension: I10

## 2017-04-15 LAB — BASIC METABOLIC PANEL
BUN: 13 mg/dL (ref 6–23)
CO2: 28 mEq/L (ref 19–32)
Calcium: 9.3 mg/dL (ref 8.4–10.5)
Chloride: 103 mEq/L (ref 96–112)
Creatinine, Ser: 0.69 mg/dL (ref 0.40–1.20)
GFR: 96.14 mL/min (ref >60.00)
Glucose, Bld: 97 mg/dL (ref 70–99)
Potassium: 3.9 mEq/L (ref 3.5–5.1)
Sodium: 139 mEq/L (ref 135–145)

## 2017-04-15 MED ORDER — LISINOPRIL 20 MG PO TABS: 20.0000 mg | ORAL_TABLET | Freq: Every day | ORAL | 2 refills | Status: DC

## 2017-04-15 MED FILL — LISINOPRIL 20 MG TABS: 20 | 30 days supply | Qty: 30 | Fill #0

## 2017-04-15 NOTE — Progress Notes (Addendum)
Chief Complaint  Patient presents with  . Headache  . Hypertension    Lindsay Wade is a 51 y.o. female here for evaluation of b/l frontal headache.  Duration: 3 weeks Palliation: none Provocation: movement Severity: 10 Quality: dull, achy, throbbing Associated symptoms: none Denies: nausea, vomiting, sonophobia, photophobia, vertigo, neck stiffness, jaw pain, gum chewing, numbness, tingling, weakness Currently with headache? Yes Failed therapies: Advil She quit her job because she doesn't feel like working and did not feel fellow workers were supportive.  Requesting referral to neurology.   Hypertension Patient presents for hypertension follow up. She does monitor home blood pressures. Blood pressures ranging on average from 130-150's/80-91's. She is compliant with medication- Norvasc 5 mg daily. Patient has these side effects of medication: none She is better adhering to a healthy diet overall. Exercise: none   Past Medical History:  Diagnosis Date  . Essential hypertension 04/15/2017  . Hypothyroid   . MRSA infection    Family History  Problem Relation Age of Onset  . Hypertension Mother   . Hypertension Father   . Hyperlipidemia Sister   . Thyroid disease Neg Hx    Current Meds  Medication Sig  . amLODipine (NORVASC) 5 MG tablet Take 1 tablet (5 mg total) by mouth daily.  Marland Kitchen esomeprazole (NEXIUM) 40 MG capsule Take 1 capsule (40 mg total) by mouth daily.  Marland Kitchen levothyroxine (SYNTHROID, LEVOTHROID) 150 MCG tablet Take 1 tablet (150 mcg total) by mouth daily before breakfast.    BP (!) 142/98 (BP Location: Left Arm, Patient Position: Sitting, Cuff Size: Large)   Pulse 71   Temp 98.5 F (36.9 C) (Oral)   Ht 5\' 6"  (1.676 m)   Wt 241 lb 2 oz (109.4 kg)   SpO2 98%   BMI 38.92 kg/m  General: awake, alert, appearing stated age Eyes: PERRLA, EOMi Heart: RRR, no murmurs, no bruits, no LE edema Lungs: CTAB, no accessory muscle use Neuro: CN 2-12 intact, no  cerebellar signs, DTR's equal and symmetry, no clonus MSK: 5/5 strength throughout, normal gait, mild TTP over posterior cervical triangle and paraspinal cervical musculature, no TTP with light hair pulling, TTP over TMJ or temporalis b/l Psych: Age appropriate judgment and insight, anxious- became tearful during exam  Acute nonintractable headache, unspecified headache type - Plan: Ambulatory referral to Neurology  Essential hypertension - Plan: Basic metabolic panel  Referral per request, offered toradol injection and/or triptan, but she declined both. Gave stretches/exercises for neck. Heat. No red flag signs/symptoms on today's exam.   Too bad that she quit job, but she stated she did it more for something to do, not for financial reasons.  Follow up for BP check in 7-10 days, will recheck BMP at that visit also. May combine to Exforge and add low dose chlorthalidone if no well controlled. The patient voiced understanding and agreement to the plan.  Crosby Oyster Tayten Heber, DO 12:00 PM 04/15/17

## 2017-04-15 NOTE — Patient Instructions (Signed)
If you do not hear anything about your referral in the next 1-2 weeks, call our office and ask for an update.  EXERCISES RANGE OF MOTION (ROM) AND STRETCHING EXERCISES  These exercises may help you when beginning to rehabilitate your issue. In order to successfully resolve your symptoms, you must improve your posture. These exercises are designed to help reduce the forward-head and rounded-shoulder posture which contributes to this condition. Your symptoms may resolve with or without further involvement from your physician, physical therapist or athletic trainer. While completing these exercises, remember:   Restoring tissue flexibility helps normal motion to return to the joints. This allows healthier, less painful movement and activity.  An effective stretch should be held for at least 20 seconds, although you may need to begin with shorter hold times for comfort.  A stretch should never be painful. You should only feel a gentle lengthening or release in the stretched tissue.  Do not do any stretch or exercise that you cannot tolerate.  STRETCH- Axial Extensors  Lie on your back on the floor. You may bend your knees for comfort. Place a rolled-up hand towel or dish towel, about 2 inches in diameter, under the part of your head that makes contact with the floor.  Gently tuck your chin, as if trying to make a "double chin," until you feel a gentle stretch at the base of your head.  Hold 15-20 seconds. Repeat 2-3 times. Complete this exercise 1 time per day.   STRETCH - Axial Extension   Stand or sit on a firm surface. Assume a good posture: chest up, shoulders drawn back, abdominal muscles slightly tense, knees unlocked (if standing) and feet hip width apart.  Slowly retract your chin so your head slides back and your chin slightly lowers. Continue to look straight ahead.  You should feel a gentle stretch in the back of your head. Be certain not to feel an aggressive stretch since this  can cause headaches later.  Hold for 15-20 seconds. Repeat 2-3 times. Complete this exercise 1 time per day.  STRETCH - Cervical Side Bend   Stand or sit on a firm surface. Assume a good posture: chest up, shoulders drawn back, abdominal muscles slightly tense, knees unlocked (if standing) and feet hip width apart.  Without letting your nose or shoulders move, slowly tip your right / left ear to your shoulder until your feel a gentle stretch in the muscles on the opposite side of your neck.  Hold 15-20 seconds. Repeat 2-3 times. Complete this exercise 1-2 times per day.  STRETCH - Cervical Rotators   Stand or sit on a firm surface. Assume a good posture: chest up, shoulders drawn back, abdominal muscles slightly tense, knees unlocked (if standing) and feet hip width apart.  Keeping your eyes level with the ground, slowly turn your head until you feel a gentle stretch along the back and opposite side of your neck.  Hold 15-20 seconds. Repeat 2-3 times. Complete this exercise 1-2 times per day.  RANGE OF MOTION - Neck Circles   Stand or sit on a firm surface. Assume a good posture: chest up, shoulders drawn back, abdominal muscles slightly tense, knees unlocked (if standing) and feet hip width apart.  Gently roll your head down and around from the back of one shoulder to the back of the other. The motion should never be forced or painful.  Repeat the motion 10-20 times, or until you feel the neck muscles relax and loosen. Repeat 2-3  times. Complete the exercise 1-2 times per day. STRENGTHENING EXERCISES - Cervical Strain and Sprain These exercises may help you when beginning to rehabilitate your injury. They may resolve your symptoms with or without further involvement from your physician, physical therapist, or athletic trainer. While completing these exercises, remember:   Muscles can gain both the endurance and the strength needed for everyday activities through controlled  exercises.  Complete these exercises as instructed by your physician, physical therapist, or athletic trainer. Progress the resistance and repetitions only as guided.  You may experience muscle soreness or fatigue, but the pain or discomfort you are trying to eliminate should never worsen during these exercises. If this pain does worsen, stop and make certain you are following the directions exactly. If the pain is still present after adjustments, discontinue the exercise until you can discuss the trouble with your clinician.  STRENGTH - Cervical Flexors, Isometric  Face a wall, standing about 6 inches away. Place a small pillow, a ball about 6-8 inches in diameter, or a folded towel between your forehead and the wall.  Slightly tuck your chin and gently push your forehead into the soft object. Push only with mild to moderate intensity, building up tension gradually. Keep your jaw and forehead relaxed.  Hold 10 to 20 seconds. Keep your breathing relaxed.  Release the tension slowly. Relax your neck muscles completely before you start the next repetition. Repeat 2-3 times. Complete this exercise 1 time per day.  STRENGTH- Cervical Lateral Flexors, Isometric   Stand about 6 inches away from a wall. Place a small pillow, a ball about 6-8 inches in diameter, or a folded towel between the side of your head and the wall.  Slightly tuck your chin and gently tilt your head into the soft object. Push only with mild to moderate intensity, building up tension gradually. Keep your jaw and forehead relaxed.  Hold 10 to 20 seconds. Keep your breathing relaxed.  Release the tension slowly. Relax your neck muscles completely before you start the next repetition. Repeat 2-3 times. Complete this exercise 1 time per day.  STRENGTH - Cervical Extensors, Isometric   Stand about 6 inches away from a wall. Place a small pillow, a ball about 6-8 inches in diameter, or a folded towel between the back of your  head and the wall.  Slightly tuck your chin and gently tilt your head back into the soft object. Push only with mild to moderate intensity, building up tension gradually. Keep your jaw and forehead relaxed.  Hold 10 to 20 seconds. Keep your breathing relaxed.  Release the tension slowly. Relax your neck muscles completely before you start the next repetition. Repeat 2-3 times. Complete this exercise 1 time per day.  POSTURE AND BODY MECHANICS CONSIDERATIONS Keeping correct posture when sitting, standing or completing your activities will reduce the stress put on different body tissues, allowing injured tissues a chance to heal and limiting painful experiences. The following are general guidelines for improved posture. Your physician or physical therapist will provide you with any instructions specific to your needs. While reading these guidelines, remember:  The exercises prescribed by your provider will help you have the flexibility and strength to maintain correct postures.  The correct posture provides the optimal environment for your joints to work. All of your joints have less wear and tear when properly supported by a spine with good posture. This means you will experience a healthier, less painful body.  Correct posture must be practiced with all  of your activities, especially prolonged sitting and standing. Correct posture is as important when doing repetitive low-stress activities (typing) as it is when doing a single heavy-load activity (lifting).  PROLONGED STANDING WHILE SLIGHTLY LEANING FORWARD When completing a task that requires you to lean forward while standing in one place for a long time, place either foot up on a stationary 2- to 4-inch high object to help maintain the best posture. When both feet are on the ground, the low back tends to lose its slight inward curve. If this curve flattens (or becomes too large), then the back and your other joints will experience too much  stress, fatigue more quickly, and can cause pain.   RESTING POSITIONS Consider which positions are most painful for you when choosing a resting position. If you have pain with flexion-based activities (sitting, bending, stooping, squatting), choose a position that allows you to rest in a less flexed posture. You would want to avoid curling into a fetal position on your side. If your pain worsens with extension-based activities (prolonged standing, working overhead), avoid resting in an extended position such as sleeping on your stomach. Most people will find more comfort when they rest with their spine in a more neutral position, neither too rounded nor too arched. Lying on a non-sagging bed on your side with a pillow between your knees, or on your back with a pillow under your knees will often provide some relief. Keep in mind, being in any one position for a prolonged period of time, no matter how correct your posture, can still lead to stiffness.  WALKING Walk with an upright posture. Your ears, shoulders, and hips should all line up. OFFICE WORK When working at a desk, create an environment that supports good, upright posture. Without extra support, muscles fatigue and lead to excessive strain on joints and other tissues.  CHAIR:  A chair should be able to slide under your desk when your back makes contact with the back of the chair. This allows you to work closely.  The chair's height should allow your eyes to be level with the upper part of your monitor and your hands to be slightly lower than your elbows.  Body position: ? Your feet should make contact with the floor. If this is not possible, use a foot rest. ? Keep your ears over your shoulders. This will reduce stress on your neck and low back.

## 2017-04-15 NOTE — Progress Notes (Signed)
Pre visit review using our clinic review tool, if applicable. No additional management support is needed unless otherwise documented below in the visit note. 

## 2017-04-20 ENCOUNTER — Ambulatory Visit: Payer: 59 | Admitting: Family Medicine

## 2017-04-23 ENCOUNTER — Encounter: Payer: Self-pay | Admitting: Neurology

## 2017-04-23 ENCOUNTER — Ambulatory Visit (INDEPENDENT_AMBULATORY_CARE_PROVIDER_SITE_OTHER): Payer: 59 | Admitting: Neurology

## 2017-04-23 ENCOUNTER — Telehealth: Payer: Self-pay | Admitting: Endocrinology

## 2017-04-23 ENCOUNTER — Other Ambulatory Visit: Payer: Self-pay | Admitting: *Deleted

## 2017-04-23 VITALS — BP 140/88 | HR 57 | Ht 67.0 in | Wt 241.8 lb

## 2017-04-23 DIAGNOSIS — R51 Headache with orthostatic component, not elsewhere classified: Secondary | ICD-10-CM

## 2017-04-23 DIAGNOSIS — R519 Headache, unspecified: Secondary | ICD-10-CM

## 2017-04-23 NOTE — Telephone Encounter (Signed)
I called Sheena & stated that anything we get from other offices regarding patients records is reviewed then sent to scan. I advised to get patient to sign another release of info. & fax it for them to get a copy. If it wasn't scanned into [facility] we didn't have it here in the office. She stated that patient had already signed release.

## 2017-04-23 NOTE — Patient Instructions (Signed)
Remember to drink plenty of fluid, eat healthy meals and do not skip any meals. Try to eat protein with a every meal and eat a healthy snack such as fruit or nuts in between meals. Try to keep a regular sleep-wake schedule and try to exercise daily, particularly in the form of walking, 20-30 minutes a day, if you can.   As far as diagnostic testing: MRI of the brain w/wo contrast  I would like to see you back in 4 weeks, sooner if we need to. Please call us with any interim questions, concerns, problems, updates or refill requests.   Our phone number is [phone]. We also have an after hours call service for urgent matters and there is a physician on-call for urgent questions. For any emergencies you know to call 911 or go to the nearest emergency room  Occipital Neuralgia Occipital neuralgia is a type of headache that causes episodes of very bad pain in the back of your head. Pain from occipital neuralgia may spread (radiate) to other parts of your head. The pain is usually brief and often goes away after you rest and relax. These headaches may be caused by irritation of the nerves that leave your spinal cord high up in your neck, just below the base of your skull (occipital nerves). Your occipital nerves transmit sensations from the back of your head, the top of your head, and the areas behind your ears. What are the causes? Occipital neuralgia can occur without any known cause (primary headache syndrome). In other cases, occipital neuralgia is caused by pressure on or irritation of one of the two occipital nerves. Causes of occipital nerve compression or irritation include:  Wear and tear of the vertebrae in the neck (osteoarthritis).  Neck injury.  Disease of the disks that separate the vertebrae.  Tumors.  Gout.  Infections.  Diabetes.  Swollen blood vessels that put pressure on the occipital nerves.  Muscle spasm in the neck.  What are the signs or symptoms? Pain is the main  symptom of occipital neuralgia. It usually starts in the back of the head but may also be felt in other areas supplied by the occipital nerves. Pain is usually on one side but may be on both sides. You may have:  Brief episodes of very bad pain that is burning, stabbing, shocking, or shooting.  Pain behind the eye.  Pain triggered by neck movement or hair brushing.  Scalp tenderness.  Aching in the back of the head between episodes of very bad pain.  How is this diagnosed? Your health care provider may diagnose occipital neuralgia based on your symptoms and a physical exam. During the exam, the health care provider may push on areas supplied by the occipital nerves to see if they are painful. Some tests may also be done to help in making the diagnosis. These may include:  Imaging studies of the upper spinal cord, such as an MRI or CT scan. These may show compression or spinal cord abnormalities.  Nerve block. You will get an injection of numbing medicine (local anesthetic) near the occipital nerve to see if this relieves pain.  How is this treated? Treatment may begin with simple measures, such as:  Rest.  Massage.  Heat.  Over-the-counter pain relievers.  If these measures do not work, you may need other treatments, including:  Medicines such as: ? Prescription-strength anti-inflammatory medicines. ? Muscle relaxants. ? Antiseizure medicines. ? Antidepressants.  Steroid injection. This involves injections of local anesthetic and  strong anti-inflammatory drugs (steroids).  Pulsed radiofrequency. Wires are implanted to deliver electrical impulses that block pain signals from the occipital nerve.  Physical therapy.  Surgery to relieve nerve pressure.  Follow these instructions at home:  Take all medicines as directed by your health care provider.  Avoid activities that cause pain.  Rest when you have an attack of pain.  Try gentle massage or a heating pad to  relieve pain.  Work with a physical therapist to learn stretching exercises you can do at home.  Try a different pillow or sleeping position.  Practice good posture.  Try to stay active. Get regular exercise that does not cause pain. Ask your health care provider to suggest safe exercises for you.  Keep all follow-up visits as directed by your health care provider. This is important. Contact a health care provider if:  Your medicine is not working.  You have new or worsening symptoms. Get help right away if:  You have very bad head pain that is not going away.  You have a sudden change in vision, balance, or speech. This information is not intended to replace advice given to you by your health care provider. Make sure you discuss any questions you have with your health care provider. Document Released: 06/17/2001 Document Revised: 11/29/2015 Document Reviewed: 06/15/2013 Elsevier Interactive Patient Education  2017 [employer].

## 2017-04-23 NOTE — Telephone Encounter (Signed)
Lindsay Wade from [facility] called stating that the pt had a Sleep Study done at the [facility] in [place]. She said that Dr Loanne Drilling had requested them but they could not see them in Epic. They would like a copy of these if possible. Please advise.

## 2017-04-23 NOTE — Progress Notes (Signed)
[name]    Provider:  Dr Jaynee Eagles Referring Provider: [name]* Primary Care Physician:  [name], DO  CC:  headaches  HPI:  Lindsay Wade is a 51 y.o. female here as a referral from Dr. [name] for headaches. PMHx HTN, No history headaches. She had acute onset headache and has been continuous since then. The pain is in the occipital area, It can be sharp, severe, painful in between. She feels it is controlling her life. She wakes up with headaches. She has pounding in the front of the head. She resigned from her job. She went to the emergnecy room. She has a constant headache that is sometimes pounding but just sore with repeated shooting sharp pain that are severe. But she always feels liek she has a fever. Advil doesn;t help. MOvement doesn;t make it worse, no light or sound sensitivity, no nausea or vomiting she has been lightheaded. She has gained weight. No other focal neurologic deficits, associated symptoms, inciting events or modifiable factors.  Reviewed notes, labs and imaging from outside physicians, which showed:  Reviewed primary care notes. Patient presented with 3 weeks of headache, provoked by movement, severity 10, dull aching and throbbing,associated symptoms without nausea, vomiting, photophobia, phonophobia, vertigo, neck stiffness weakness, failed therapies Advil, she quit her job because she didn't feel like working and did not feel workers were supported. She declined any intervention in her primary care office.  BMP normal  Review of Systems: Patient complains of symptoms per HPI as well as the following symptoms: no CP, no SOB. Pertinent negatives and positives per HPI. All others negative.   Social History   Social History  . Marital status: Married    Spouse name: N/A  . Number of children: N/A  . Years of education: N/A   Occupational History  . Not on file.   Social History Main Topics  . Smoking  status: Never Smoker  . Smokeless tobacco: Never Used  . Alcohol use Yes     Comment: social drinker  . Drug use: No  . Sexual activity: Not on file   Other Topics Concern  . Not on file   Social History Narrative  . No narrative on file    Family History  Problem Relation Age of Onset  . Hypertension Mother   . Hypertension Father   . Parkinsonism Father   . Hyperlipidemia Sister   . Thyroid disease Neg Hx     Past Medical History:  Diagnosis Date  . Essential hypertension 04/15/2017  . Headache   . Hypothyroid   . Hypothyroidism   . MRSA infection     Past Surgical History:  Procedure Laterality Date  . CESAREAN SECTION    . TONSILLECTOMY      Current Outpatient Prescriptions  Medication Sig Dispense Refill  . amLODipine (NORVASC) 5 MG tablet Take 1 tablet (5 mg total) by mouth daily. 30 tablet 2  . esomeprazole (NEXIUM) 40 MG capsule Take 1 capsule (40 mg total) by mouth daily. 30 capsule 0  . levothyroxine (SYNTHROID, LEVOTHROID) 150 MCG tablet Take 1 tablet (150 mcg total) by mouth daily before breakfast. 90 tablet 4  . lisinopril (PRINIVIL,ZESTRIL) 20 MG tablet Take 1 tablet (20 mg total) by mouth daily. 30 tablet 2   No current facility-administered medications for this visit.     Allergies as of 04/23/2017  . (No Known Allergies)    Vitals: BP 140/88 (BP Location: Right Arm, Patient Position: Sitting, Cuff Size: Normal)  Pulse (!) 57   Ht 5\' 7"  (1.702 m)   Wt 241 lb 12.8 oz (109.7 kg)   BMI 37.87 kg/m  Last Weight:  Wt Readings from Last 1 Encounters:  04/23/17 241 lb 12.8 oz (109.7 kg)   Last Height:   Ht Readings from Last 1 Encounters:  04/23/17 5\' 7"  (1.702 m)   Physical exam: Exam: Gen: NAD, conversant, well nourised, obese, well groomed                     CV: RRR, no MRG. No Carotid Bruits. No peripheral edema, warm, nontender Eyes: Conjunctivae clear without exudates or hemorrhage  Neuro: Detailed Neurologic Exam  Speech:     Speech is normal; fluent and spontaneous with normal comprehension.  Cognition:    The patient is oriented to person, place, and time;     recent and remote memory intact;     language fluent;     normal attention, concentration,     fund of knowledge Cranial Nerves:    The pupils are equal, round, and reactive to light. The fundi are normal and spontaneous venous pulsations are present. Visual fields are full to finger confrontation. Extraocular movements are intact. Trigeminal sensation is intact and the muscles of mastication are normal. The face is symmetric. The palate elevates in the midline. Hearing intact. Voice is normal. Shoulder shrug is normal. The tongue has normal motion without fasciculations.   Coordination:    Normal finger to nose and heel to shin. Normal rapid alternating movements.   Gait:    Heel-toe and tandem gait are normal.   Motor Observation:    No asymmetry, no atrophy, and no involuntary movements noted. Tone:    Normal muscle tone.    Posture:    Posture is normal. normal erect    Strength:    Strength is V/V in the upper and lower limbs.      Sensation: intact to LT     Reflex Exam:  DTR's:    Deep tendon reflexes in the upper and lower extremities are normal bilaterally.   Toes:    The toes are downgoing bilaterally.   Clonus:    Clonus is absent.       Assessment/Plan:  51 year old female with new onset occipital neuralgia and neck pain with movement. Neuro exam in non focal. Pain is on right and is located in the distribution of the greater, lesser and/or third occipital nerves, paroxysmal and brief, painful, sharp, with tenderness and trigger points at the emergence of the greater occipital nerve with continuous pain between intervals. Differential includes pain in the upper cervical joints or disks, suboccipital or upper posterior neck muscles including the traps/scm, spinal and posterior cranial fossa dura mater, vertebral arteries,  structural and infiltrative lesions such as meningioma, schwannoma, myelitis, compressive disk disease and others. Will need MRI of the brain and cervical spine. Will order MRI brain and consider MRI cervical spine if unremarkable. Will refer for obesity to healthy weight and wellness and sleep evaluation for OSA  Orders Placed This Encounter  Procedures  . MR BRAIN W WO CONTRAST  . Ambulatory referral to [facility]  . Ambulatory referral to Sleep Studies    Discussed: To prevent or relieve headaches, try the following: Cool Compress. Lie down and place a cool compress on your head.  Avoid headache triggers. If certain foods or odors seem to have triggered your migraines in the past, avoid them. A headache diary  might help you identify triggers.  Include physical activity in your daily routine. Try a daily walk or other moderate aerobic exercise.  Manage stress. Find healthy ways to cope with the stressors, such as delegating tasks on your to-do list.  Practice relaxation techniques. Try deep breathing, yoga, massage and visualization.  Eat regularly. Eating regularly scheduled meals and maintaining a healthy diet might help prevent headaches. Also, drink plenty of fluids.  Follow a regular sleep schedule. Sleep deprivation might contribute to headaches Consider biofeedback. With this mind-body technique, you learn to control certain bodily functions - such as muscle tension, heart rate and blood pressure - to prevent headaches or reduce headache pain.    Proceed to emergency room if you experience new or worsening symptoms or symptoms do not resolve, if you have new neurologic symptoms or if headache is severe, or for any concerning symptom.   Provided education and documentation from American headache Society toolbox including articles on: chronic migraine medication overuse headache, chronic migraines, prevention of migraines, behavioral and other nonpharmacologic treatments for  headache.  Performed by Dr. Jaynee Eagles M.D. 66ml Lidocaine 1%,9ml Marcaine 0.5% in the 30-gauge needle was used. All procedures a documented blood were medically necessary, reasonable and appropriate based on the patient's history, medical diagnosis and physician opinion. Verbal informed consent was obtained from the patient, patient was informed of potential risk of procedure, including bruising, bleeding, hematoma formation, infection, muscle weakness, muscle pain, numbness, transient hypertension, transient hyperglycemia and transient insomnia among others. All areas injected were topically clean with isopropyl rubbing alcohol. Nonsterile nonlatex gloves were worn during the procedure.  1. Greater occipital nerve block [phone]). The greater occipital nerve site was identified at the nuchal line medial to the occipital artery. Medication was injected into the right occipital nerve areas and suboccipital areas. Patient's condition is associated with inflammation of the greater occipital nerve and associated multiple groups. Injection was deemed medically necessary, reasonable and appropriate. Injection represents a separate and unique surgical service.  2. Lesser occipital nerve block [phone]). The lesser occipital nerve site was identified approximately 2 cm lateral to the greater occipital nerve. Occasion was injected into the right occipital nerve areas. Patient's condition is associated with inflammation of the lesser occipital nerve and associated muscle groups. Injection was deemed medically necessary, reasonable and appropriate. Injection represents a separate and unique surgical service.  Cc:  [name], DO   Sarina Ill, MD  [facility] [address] [place], [place] [address]  Phone [phone] Fax [phone]

## 2017-04-24 ENCOUNTER — Encounter: Payer: Self-pay | Admitting: Neurology

## 2017-04-24 DIAGNOSIS — R519 Headache, unspecified: Secondary | ICD-10-CM | POA: Insufficient documentation

## 2017-04-24 DIAGNOSIS — R51 Headache: Secondary | ICD-10-CM

## 2017-04-27 ENCOUNTER — Ambulatory Visit (INDEPENDENT_AMBULATORY_CARE_PROVIDER_SITE_OTHER): Payer: 59 | Admitting: Family Medicine

## 2017-04-27 ENCOUNTER — Encounter: Payer: Self-pay | Admitting: Family Medicine

## 2017-04-27 VITALS — BP 112/88 | HR 65 | Temp 98.3°F | Ht 67.0 in | Wt 243.1 lb

## 2017-04-27 DIAGNOSIS — I10 Essential (primary) hypertension: Secondary | ICD-10-CM | POA: Diagnosis not present

## 2017-04-27 LAB — BASIC METABOLIC PANEL
BUN: 15 mg/dL (ref 6–23)
CO2: 26 mEq/L (ref 19–32)
Calcium: 9.3 mg/dL (ref 8.4–10.5)
Chloride: 104 mEq/L (ref 96–112)
Creatinine, Ser: 0.7 mg/dL (ref 0.40–1.20)
GFR: 94.54 mL/min (ref >60.00)
Glucose, Bld: 90 mg/dL (ref 70–99)
Potassium: 4.2 mEq/L (ref 3.5–5.1)
Sodium: 138 mEq/L (ref 135–145)

## 2017-04-27 NOTE — Patient Instructions (Signed)
Take the lisinopril in the morning and the amlodipine in the evening.  Aim to do some physical exertion for 150 minutes per week. This is typically divided into 5 days per week, 30 minutes per day. The activity should be enough to get your heart rate up. Anything is better than nothing if you have time constraints.  Good luck with weight watchers!  Check BP once per week, do not wait 1 min between readings anymore.   Let us know if you need anything.

## 2017-04-27 NOTE — Progress Notes (Signed)
Chief Complaint  Patient presents with  . Hypertension    Subjective Lindsay Wade is a 51 y.o. female who presents for hypertension follow up. She does monitor home blood pressures. Blood pressures ranging from 120-130's/70-80's on average. She is having some elevated readings in the morning before she takes her blood pressure medication intermittently. She is compliant with medications. Patient has these side effects of medication: none She is not adhering to a healthy diet overall. Current exercise: none   Past Medical History:  Diagnosis Date  . Essential hypertension 04/15/2017  . Headache   . Hypothyroid   . Hypothyroidism   . MRSA infection    Family History  Problem Relation Age of Onset  . Hypertension Mother   . Hypertension Father   . Parkinsonism Father   . Hyperlipidemia Sister   . Thyroid disease Neg Hx      Medications Current Outpatient Prescriptions on File Prior to Visit  Medication Sig Dispense Refill  . amLODipine (NORVASC) 5 MG tablet Take 1 tablet (5 mg total) by mouth daily. 30 tablet 2  . esomeprazole (NEXIUM) 40 MG capsule Take 1 capsule (40 mg total) by mouth daily. 30 capsule 0  . levothyroxine (SYNTHROID, LEVOTHROID) 150 MCG tablet Take 1 tablet (150 mcg total) by mouth daily before breakfast. 90 tablet 4  . lisinopril (PRINIVIL,ZESTRIL) 20 MG tablet Take 1 tablet (20 mg total) by mouth daily. 30 tablet 2   Allergies No Known Allergies  Review of Systems Cardiovascular: no chest pain Respiratory:  no shortness of breath  Exam BP 112/88 (BP Location: Left Arm, Patient Position: Sitting, Cuff Size: Large)   Pulse 65   Temp 98.3 F (36.8 C) (Oral)   Ht 5\' 7"  (1.702 m)   Wt 243 lb 2 oz (110.3 kg)   SpO2 98%   BMI 38.08 kg/m  General:  well developed, well nourished, in no apparent distress Skin:  warm, no pallor or diaphoresis Eyes:  pupils equal and round, sclera anicteric without injection Heart :RRR, no murmurs, no bruits, no  LE edema Lungs:  clear to auscultation, no accessory muscle use Psych: well oriented with normal range of affect and appropriate judgment/insight  Essential hypertension - Plan: Basic Metabolic Panel (BMET)  Orders as above. Counseled on diet and exercise. Starting [employer]. Take lisinopril in AM, amlodipine in PM. F/u in 3 mo. The patient voiced understanding and agreement to the plan.  Greater than 15 minutes were spent face to face with the patient with greater than 50% of this time spent counseling on diet, exercise, treatment plan, prognosis/coming off of medication.    [place], DO 04/27/17  10:27 AM

## 2017-04-27 NOTE — Progress Notes (Signed)
Pre visit review using our clinic review tool, if applicable. No additional management support is needed unless otherwise documented below in the visit note. 

## 2017-05-05 ENCOUNTER — Ambulatory Visit (HOSPITAL_COMMUNITY)
Admission: RE | Admit: 2017-05-05 | Discharge: 2017-05-05 | Disposition: A | Discharge status: Home / Self Care | Payer: 59 | Source: Ambulatory Visit | Attending: Neurology | Admitting: Neurology

## 2017-05-05 DIAGNOSIS — R519 Headache, unspecified: Secondary | ICD-10-CM

## 2017-05-05 DIAGNOSIS — R51 Headache: Secondary | ICD-10-CM | POA: Diagnosis not present

## 2017-05-05 MED ORDER — GADOBENATE DIMEGLUMINE 529 MG/ML IV SOLN
20.0000 mL | Freq: Once | INTRAVENOUS | Status: AC
  Administered 2017-05-05: 20 mL via INTRAVENOUS

## 2017-05-06 ENCOUNTER — Telehealth: Payer: Self-pay | Admitting: Neurology

## 2017-05-06 ENCOUNTER — Other Ambulatory Visit: Payer: Self-pay | Admitting: Neurology

## 2017-05-06 DIAGNOSIS — M542 Cervicalgia: Secondary | ICD-10-CM

## 2017-05-06 DIAGNOSIS — G8929 Other chronic pain: Secondary | ICD-10-CM

## 2017-05-06 DIAGNOSIS — R51 Headache: Secondary | ICD-10-CM

## 2017-05-06 DIAGNOSIS — R519 Headache, unspecified: Secondary | ICD-10-CM

## 2017-05-06 DIAGNOSIS — D492 Neoplasm of unspecified behavior of bone, soft tissue, and skin: Secondary | ICD-10-CM

## 2017-05-06 DIAGNOSIS — M5412 Radiculopathy, cervical region: Secondary | ICD-10-CM

## 2017-05-06 DIAGNOSIS — N888 Other specified noninflammatory disorders of cervix uteri: Secondary | ICD-10-CM

## 2017-05-06 NOTE — Telephone Encounter (Signed)
I left a message for patient this evening, left a voicemail with the details of her imaging below on the phone number stated in her DPR, I have ordered MRI of the cervical spine with and without contrast as suggested by her imaging (see below)   1. Normal MRI appearance of the brain. No explanation for headache. 2. Partially visible clustered cystic appearing changes along the left aspect of the C2 and C3 vertebral junction. This is incompletely evaluated but might be degenerative. Recommend a dedicated Cervical spine MRI (without and with contrast preferred) to further characterize.   Caroline Sauger

## 2017-05-07 ENCOUNTER — Ambulatory Visit (HOSPITAL_COMMUNITY): Payer: 59

## 2017-05-07 NOTE — Telephone Encounter (Signed)
She did not listen to her voicemail I left which was detailed I left 2 voicemails yesterday), as per her DPR. She is aware and willing to go into the MRI. Thanks

## 2017-05-07 NOTE — Telephone Encounter (Addendum)
Pt called she is wanting to discuss the MRI results and why she needs to have cervical. Please call, she can be reached at [phone]. Pt is teary-eyed, she is concerned mass on lumbar could have something to do with HA's. She is panicking.

## 2017-05-07 NOTE — Telephone Encounter (Signed)
Noted, thank you. I left a voicemail for patient to be aware of this MRI cervical spine order and I know she likes to have it at [facility] and left their phone number of [phone] to schedule.

## 2017-05-11 NOTE — Telephone Encounter (Signed)
Left patient a voicemail for her to call me back about scheduling her MRI.

## 2017-05-12 MED FILL — LISINOPRIL 20 MG TABS: 20 | 30 days supply | Qty: 30 | Fill #1

## 2017-05-16 ENCOUNTER — Ambulatory Visit (HOSPITAL_COMMUNITY)
Admission: RE | Admit: 2017-05-16 | Discharge: 2017-05-16 | Disposition: A | Discharge status: Home / Self Care | Payer: 59 | Source: Ambulatory Visit | Attending: Neurology | Admitting: Neurology

## 2017-05-16 DIAGNOSIS — R51 Headache: Secondary | ICD-10-CM | POA: Insufficient documentation

## 2017-05-16 DIAGNOSIS — M542 Cervicalgia: Secondary | ICD-10-CM

## 2017-05-16 DIAGNOSIS — R519 Headache, unspecified: Secondary | ICD-10-CM

## 2017-05-16 DIAGNOSIS — G8929 Other chronic pain: Secondary | ICD-10-CM | POA: Diagnosis not present

## 2017-05-16 DIAGNOSIS — M50222 Other cervical disc displacement at C5-C6 level: Secondary | ICD-10-CM | POA: Insufficient documentation

## 2017-05-16 DIAGNOSIS — D492 Neoplasm of unspecified behavior of bone, soft tissue, and skin: Secondary | ICD-10-CM | POA: Diagnosis not present

## 2017-05-16 DIAGNOSIS — M5412 Radiculopathy, cervical region: Secondary | ICD-10-CM | POA: Diagnosis not present

## 2017-05-16 DIAGNOSIS — N888 Other specified noninflammatory disorders of cervix uteri: Secondary | ICD-10-CM | POA: Diagnosis not present

## 2017-05-16 MED ORDER — GADOBENATE DIMEGLUMINE 529 MG/ML IV SOLN
20.0000 mL | Freq: Once | INTRAVENOUS | Status: AC | PRN
  Administered 2017-05-16: 20 mL via INTRAVENOUS

## 2017-05-20 ENCOUNTER — Telehealth: Payer: Self-pay | Admitting: *Deleted

## 2017-05-20 NOTE — Telephone Encounter (Signed)
-----   Message from Melvenia Beam, MD sent at 05/17/2017  6:26 PM EST ----- Essentially normal, there was nothing seen that correlated with the previous finding on MRI brain sometimes this happens as MRI of the brain are not good to look at necks. Her mri c-spine is essentially normal, great news.

## 2017-05-20 NOTE — Telephone Encounter (Signed)
Called and spoke with pt. She verbalized understanding of essential normal MRI c-spine, nothing seen that correlated with the previous finding on the MRI brain and that sometimes this happens as MRI of the brain is not good to look at necks. She asked if she could be seen sooner for a f/u as she still having daily h/a and is not taking medications for this. I advised her of other good habits such as exercise, limit caffeine intake, stay hydrated, good sleep habits. She verbalized understanding and is aware of her upcoming appt for sleep consult. I advised her that she needs to be evaluated and treated if necessary. I was able to schedule her for November 27th @ 3:30 arrival time 3:00. She verbalized understanding and appreciation.

## 2017-05-26 ENCOUNTER — Encounter: Payer: Self-pay | Admitting: Neurology

## 2017-05-26 ENCOUNTER — Ambulatory Visit (INDEPENDENT_AMBULATORY_CARE_PROVIDER_SITE_OTHER): Payer: 59 | Admitting: Neurology

## 2017-05-26 VITALS — BP 115/80 | HR 74 | Ht 66.0 in | Wt 244.0 lb

## 2017-05-26 DIAGNOSIS — I10 Essential (primary) hypertension: Secondary | ICD-10-CM | POA: Diagnosis not present

## 2017-05-26 DIAGNOSIS — R0683 Snoring: Secondary | ICD-10-CM | POA: Diagnosis not present

## 2017-05-26 DIAGNOSIS — R51 Headache: Secondary | ICD-10-CM

## 2017-05-26 DIAGNOSIS — R519 Headache, unspecified: Secondary | ICD-10-CM | POA: Insufficient documentation

## 2017-05-26 NOTE — Progress Notes (Addendum)
SLEEP MEDICINE CLINIC   Provider:  Larey Wade, M D  Primary Care Physician:  Lindsay Pal, DO   Lindsay Wade M.D.   Chief Complaint  Patient presents with  . New Patient (Initial Visit)    pt alone, rm 11. pt is waking up with horrible headaches, pt has been told she snores loud., recently dg with High BP. prior sleep study 02/04/2013.    HPI:  Lindsay Wade is a married, right handed 51 y.o. female of [place] descent , seen here as  a referral  from Dr. Jaynee Wade for a sleep consultation.  Chief complaint according to patient : " I have bad, bad headaches in the morning and high blood pressure. "  Lindsay Wade presents today as a new patient to the sleep clinic but an established headache patient of Dr. Jaynee Wade.  She reports that over the last 3 months she had escalating headaches which bothered her mostly in the morning.  For a while it was thought that if her blood pressure is better controlled her headaches would go away but this was not the case.  Dr. Jaynee Wade obtained an MRI of the brain and also cervical spine images which returned normal.  The patient had mentioned that her mother suffered from meningioma.  The Patient also describes that she feels hot as if having a fever or viral infection, she feels weak and tired and would like to rest with her headaches.  The headaches are present in the morning but they do not wake her from sleep.  Sometimes a Wade more tension-like other times pounding and pulsatile, she is not bothered by movement light or loud sounds.  She is not squeezy.  There has been no dizziness, no shortness of breath and no chest pain. Her husband has told her that she is a family with snoring and 1 of our avenues to evaluate her headache will be to rule out sleep apnea.  Lindsay Wade informed me that she had previously been diagnosed with sleep apnea in August 2014 in [place], [place].  [facility] for sleep studies.   I have only a copy for a CPAP titration which does not "the results of the baseline sleep study.  "It was a baseline RDI and not AHI.  13.2/h with an Epworth sleepiness score at the time endorsed at 13 points.  CPAP titration explored pressures beginning at 7 cm and changed finally to BiPAP at 10/6 centimeter water.  The patient was deemed intolerant despite use of various mask styles, types and sizes.  The patient requested to end the study prematurely and left the lab.  Sleep habits are as follows: Her husband has moved on to another bedroom as he is bothered by her snoring.  The usual bedtime for the patient is 10:30 PM and she does not have trouble initiating sleep.  She had a fullness feeling in her throat, felt that her airway was partially blocked and began sleeping more reclined rather than flat.  This has helped.  She sleeps on her bed- but now in supine on one pillow.  She describes her bedroom is cool, quiet and dark - conducive to sleep. She gets up twice to go to the bathroom, around 4 and 6 AM. She rises at 6.30 AM spontaneously, on weekends until 8 AM. Daytime naps are unscheduled- and she falls asleep after lunch.  Sleep medical history and family sleep history: To the patient's knowledge there is no family member that  was diagnosed with apnea, but there may be family members that have it.  Her mother snores very loudly.  The patient had no childhood history of sleepwalking, night terrors or enuresis. She had a tonsillectomy at age 30.  Social history:  Married, mother of 2 sons, 49 and 82 years old. Never been a Smoker, ETOH rarely. Caffeine: 3 cups of 8 ounces of coffee in AM, no iced tea, no energy drinks, once a week a coke. No history of night shifts. 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 She moved here from [facility] December 2016 .   Provider:  Dr Lindsay Wade   Primary Care Physician:  Lindsay Pal, DO  CC:  headaches Dr.  Cathren Laine note : "   [name] is a 51 y.o. female here as a referral from Dr. [name] for headaches. PMHx HTN, No history headaches. She had acute onset headache and has been continuous since then. The pain is in the occipital area, It can be sharp, severe, painful in between. She feels it is controlling her life. She wakes up with headaches. She has pounding in the front of the head. She resigned from her job. She went to the emergnecy room. She has a constant headache that is sometimes pounding but just sore with repeated shooting sharp pain that are severe. But she always feels liek she has a fever. Advil doesn;t help. MOvement doesn;t make it worse, no light or sound sensitivity, no nausea or vomiting she has been lightheaded. She has gained weight. No other focal neurologic deficits, associated symptoms, inciting events or modifiable factors.Reviewed notes, labs and imaging from outside physicians, which showed: Reviewed primary care notes. Patient presented with 3 weeks of headache, provoked by movement, severity 10, dull aching and throbbing,associated symptoms without nausea, vomiting, photophobia, phonophobia, vertigo, neck stiffness weakness, failed therapies Advil, she quit her job because she didn't feel like working and did not feel workers were supported. She declined any intervention in her primary care office." Dr. Jaynee Eagles, MD     Review of Systems: Out of a complete 14 system review, the patient complains of only the following symptoms, and all other reviewed systems are negative. Snoring,apnea, weight gain to BMI of 38, hoarseness,acid reflux, bolus feeling,   Epworth score 10 , Fatigue severity score 17, depression score 1/ 15    Social History   Socioeconomic History  . Marital status: Married    Spouse name: Not on file  . Number of children: Not on file  . Years of education: Not on file  . Highest education level: Not on file  Social Needs  . Financial resource  strain: Not on file  . Food insecurity - worry: Not on file  . Food insecurity - inability: Not on file  . Transportation needs - medical: Not on file  . Transportation needs - non-medical: Not on file  Occupational History  . Not on file  Tobacco Use  . Smoking status: Never Smoker  . Smokeless tobacco: Never Used  Substance and Sexual Activity  . Alcohol use: Yes    Comment: social drinker  . Drug use: No  . Sexual activity: Not on file  Other Topics Concern  . Not on file  Social History Narrative  . Not on file    Family History  Problem Relation Age of Onset  . Hypertension Mother   . Hypertension Father   . Parkinsonism Father   .  Hyperlipidemia Sister   . Thyroid disease Neg Hx     Past Medical History:  Diagnosis Date  . Essential hypertension 04/15/2017  . Headache   . Hypothyroid   . Hypothyroidism   . MRSA infection     Past Surgical History:  Procedure Laterality Date  . CESAREAN SECTION    . TONSILLECTOMY      Current Outpatient Medications  Medication Sig Dispense Refill  . amLODipine (NORVASC) 5 MG tablet Take 1 tablet (5 mg total) by mouth daily. 30 tablet 2  . levothyroxine (SYNTHROID, LEVOTHROID) 150 MCG tablet Take 1 tablet (150 mcg total) by mouth daily before breakfast. 90 tablet 4  . lisinopril (PRINIVIL,ZESTRIL) 20 MG tablet Take 1 tablet (20 mg total) by mouth daily. 30 tablet 2  . esomeprazole (NEXIUM) 40 MG capsule Take 1 capsule (40 mg total) by mouth daily. (Patient not taking: Reported on 05/26/2017) 30 capsule 0   No current facility-administered medications for this visit.     Allergies as of 05/26/2017  . (No Known Allergies)    Vitals: BP 115/80   Pulse 74   Ht 5\' 6"  (1.676 m)   Wt 244 lb (110.7 kg)   BMI 39.38 kg/m  Last Weight:  Wt Readings from Last 1 Encounters:  05/26/17 244 lb (110.7 kg)   RKY:HCWC mass index is 39.38 kg/m.     Last Height:   Ht Readings from Last 1 Encounters:  05/26/17 5\' 6"  (1.676 m)     Physical exam:  General: The patient is awake, alert and appears not in acute distress. The patient is well groomed. Head: Normocephalic, atraumatic. Neck is supple. Mallampati 3-4 - swollen uvula .,  neck circumference:17. 25 - with goiter - Nasal airflow patent , TMJ is evident . Retrognathia is not seen.  Cardiovascular:  Regular rate and rhythm, without  murmurs or carotid bruit, and without distended neck veins. Respiratory: Lungs are clear to auscultation. Skin:  Without evidence of edema, or rash Trunk: BMI is  38. The patient's posture is erect  Neurologic exam : The patient is awake and alert, oriented to place and time.   Memory subjective described as intact. Attention span & concentration ability appears normal.  Speech is fluent,  without dysarthria, dysphonia or aphasia.  Mood and affect are appropriate.  Cranial nerves: Pupils are equal and briskly reactive to light. Funduscopic exam without evidence of pallor or edema.  Extraocular movements  in vertical and horizontal planes intact and without nystagmus. Visual fields by finger perimetry are intact. Hearing to finger rub intact. Facial sensation intact to fine touch.Facial motor strength is symmetric and tongue and uvula move midline. Shoulder shrug was symmetrical.  Motor exam:  Normal tone, muscle bulk and symmetric strength in all extremities. Sensory:  Fine touch, pinprick and vibration. Proprioception tested in the upper extremities was normal. Coordination: Finger-to-nose maneuver  normal without evidence of ataxia, dysmetria or tremor. Gait and station: Patient walks without assistive device .Tandem gait is unfragmented. Turns with 3 Steps.  Deep tendon reflexes: in the  upper and lower extremities are symmetric and intact.   Assessment:  After physical and neurologic examination, review of laboratory studies,  Personal review of imaging studies, reports of other /same  Imaging studies, results of polysomnography  and / or neurophysiology testing and pre-existing records as far as provided in visit., my assessment is   1) Lindsay Wade does have several risk factors for the presence of obstructive sleep apnea aside from her husband's  observation.  Her neck circumference is large and there seems to be a small goiter, her upper airway is restricted by a swollen soft palate and uvula, Mallampati grade 4.  She also exceeds by body mass index on the risk factor scale.  I would like very much for her to undergo a sleep study, this could be an attended in- lab study.  The patient just informed me that she does have glucose intolerance-borderline diabetes and Hypertension with 585 mmHg systolic.    The patient was advised of the nature of the diagnosed disorder , the treatment options and the  risks for general health and wellness arising from not treating the condition.   I spent more than 45 minutes of face to face time with the patient.  Greater than 50% of time was spent in counseling and coordination of care. We have discussed the diagnosis and differential and I answered the patient's questions.    Plan:  Treatment plan and additional workup :  SPLIT night PSG , with Co2 and oximetry.    Lindsay Seat, MD 27/78/2423, 5:36 AM  Certified in Neurology by ABPN Certified in [place] by [facility] [address], [place], [place] [address]

## 2017-05-26 NOTE — Patient Instructions (Signed)

## 2017-06-02 ENCOUNTER — Encounter: Payer: Self-pay | Admitting: Neurology

## 2017-06-02 ENCOUNTER — Ambulatory Visit (INDEPENDENT_AMBULATORY_CARE_PROVIDER_SITE_OTHER): Payer: 59 | Admitting: Neurology

## 2017-06-02 VITALS — BP 130/91 | HR 79 | Ht 66.0 in | Wt 244.6 lb

## 2017-06-02 DIAGNOSIS — R519 Headache, unspecified: Secondary | ICD-10-CM

## 2017-06-02 DIAGNOSIS — M5481 Occipital neuralgia: Secondary | ICD-10-CM

## 2017-06-02 DIAGNOSIS — R51 Headache: Secondary | ICD-10-CM

## 2017-06-02 MED ORDER — GABAPENTIN 300 MG PO CAPS: 300.0000 mg | ORAL_CAPSULE | Freq: Three times a day (TID) | ORAL | 11 refills | Status: DC | PRN

## 2017-06-02 MED FILL — GABAPENTIN 300 MG CAPSULE: 300 | 30 days supply | Qty: 90 | Fill #0

## 2017-06-02 NOTE — Progress Notes (Signed)
[name]    Provider:  Dr Jaynee Eagles Referring Provider: [name]* Primary Care Physician:  [name], DO   CC:  headaches  Interval history 06/02/2017: Patient returns today for follow-up.  She still having pain on the right side of her head in the occipital area.  MRI of the brain and MRI of the cervical spine were unremarkable.  She has seen her sleep doctor and is pending sleep evaluation for obstructive sleep apnea as headaches are mostly in the morning.  She has a history of shingles in the occipital area in the 12th grade, the back right side of her head has always been sensitive since then, but these sharp pains, sensitivity, very different.  Discussed unlikely reactivation in a same nerve however the shingles may have left the nerve impaired making it easier for an occipital neuralgia.  Discussed possible treatments, will try gabapentin as needed.  HPI:  Lindsay Wade is a 51 y.o. female here as a referral from Dr. [name] for headaches. PMHx HTN, No history headaches. She had acute onset headache and has been continuous since then. The pain is in the occipital area, It can be sharp, severe, painful in between. She feels it is controlling her life. She wakes up with headaches. She has pounding in the front of the head. She resigned from her job. She went to the emergnecy room. She has a constant headache that is sometimes pounding but just sore with repeated shooting sharp pain that are severe. But she always feels liek she has a fever. Advil doesn;t help. MOvement doesn;t make it worse, no light or sound sensitivity, no nausea or vomiting she has been lightheaded. She has gained weight. No other focal neurologic deficits, associated symptoms, inciting events or modifiable factors.   Review of Systems: Patient complains of symptoms per HPI as well as the following symptoms: headache, snoring. Pertinent negatives and positives per HPI. All  others negative.   Social History   Socioeconomic History  . Marital status: Married    Spouse name: Not on file  . Number of children: 2  . Years of education: 39  . Highest education level: High school graduate  Social Needs  . Financial resource strain: Not on file  . Food insecurity - worry: Not on file  . Food insecurity - inability: Not on file  . Transportation needs - medical: Not on file  . Transportation needs - non-medical: Not on file  Occupational History  . Occupation: stay at home mom   Tobacco Use  . Smoking status: Never Smoker  . Smokeless tobacco: Never Used  Substance and Sexual Activity  . Alcohol use: Yes    Comment: social drinker; very rare   . Drug use: No  . Sexual activity: Not on file  Other Topics Concern  . Not on file  Social History Narrative   Lives at home with her husband and 2 sons   Right handed   3-4 cups of caffeine daily    Family History  Problem Relation Age of Onset  . Hypertension Mother   . Hypertension Father   . Parkinsonism Father   . Hyperlipidemia Sister   . Thyroid disease Neg Hx     Past Medical History:  Diagnosis Date  . Essential hypertension 04/15/2017  . Headache   . Hypothyroid   . Hypothyroidism   . MRSA infection     Past Surgical History:  Procedure Laterality Date  . CESAREAN SECTION    .  TONSILLECTOMY      Current Outpatient Medications  Medication Sig Dispense Refill  . amLODipine (NORVASC) 5 MG tablet Take 1 tablet (5 mg total) by mouth daily. 30 tablet 2  . levothyroxine (SYNTHROID, LEVOTHROID) 150 MCG tablet Take 1 tablet (150 mcg total) by mouth daily before breakfast. 90 tablet 4  . lisinopril (PRINIVIL,ZESTRIL) 20 MG tablet Take 1 tablet (20 mg total) by mouth daily. 30 tablet 2  . gabapentin (NEURONTIN) 300 MG capsule Take 1 capsule (300 mg total) by mouth 3 (three) times daily as needed. 90 capsule 11   No current facility-administered medications for this visit.     Allergies  as of 06/02/2017  . (No Known Allergies)    Vitals: BP (!) 130/91 (BP Location: Right Arm, Patient Position: Sitting)   Pulse 79   Ht 5\' 6"  (1.676 m)   Wt 244 lb 9.6 oz (110.9 kg)   BMI 39.48 kg/m  Last Weight:  Wt Readings from Last 1 Encounters:  06/02/17 244 lb 9.6 oz (110.9 kg)   Last Height:   Ht Readings from Last 1 Encounters:  06/02/17 5\' 6"  (1.676 m)   Physical exam: Exam: Gen: NAD, conversant, well nourised, obese, well groomed                     CV: RRR, no MRG. No Carotid Bruits. No peripheral edema, warm, nontender Eyes: Conjunctivae clear without exudates or hemorrhage  Neuro: Detailed Neurologic Exam  Speech:    Speech is normal; fluent and spontaneous with normal comprehension.  Cognition:    The patient is oriented to person, place, and time;     recent and remote memory intact;     language fluent;     normal attention, concentration,     fund of knowledge Cranial Nerves:    The pupils are equal, round, and reactive to light. The fundi are normal and spontaneous venous pulsations are present. Visual fields are full to finger confrontation. Extraocular movements are intact. Trigeminal sensation is intact and the muscles of mastication are normal. The face is symmetric. The palate elevates in the midline. Hearing intact. Voice is normal. Shoulder shrug is normal. The tongue has normal motion without fasciculations.   Coordination:    Normal finger to nose and heel to shin. Normal rapid alternating movements.   Gait:    Heel-toe and tandem gait are normal.   Motor Observation:    No asymmetry, no atrophy, and no involuntary movements noted. Tone:    Normal muscle tone.    Posture:    Posture is normal. normal erect    Strength:    Strength is V/V in the upper and lower limbs.      Sensation: intact to LT     Reflex Exam:  DTR's:    Deep tendon reflexes in the upper and lower extremities are normal bilaterally.   Toes:    The toes are  downgoing bilaterally.   Clonus:    Clonus is absent.     Assessment/Plan:   51 year old female with new onset occipital headache on right and neck pain with movement. Neuro exam in non focal.    - Differential includes migraine, pain in the upper cervical joints or disks, suboccipital or upper posterior neck muscles including the traps/scm, spinal and posterior cranial fossa dura mater, vertebral arteries,  - MRI of the brain and MRI of the cervical spine were both unremarkable for etiologies.   - She snores heavily,  morning headaches, this could also be a component of obstructive sleep apnea and she has had an evaluation with her sleep doctor and is pending overnight sleep test.   - Will refer for obesity to healthy weight and wellness and sleep evaluation for OSA - Occipital nerve blocks did not help - Gabapentin prn.  - physical therapy  To prevent or relieve headaches, try the following: Cool Compress. Lie down and place a cool compress on your head.  Avoid headache triggers. If certain foods or odors seem to have triggered your migraines in the past, avoid them. A headache diary might help you identify triggers.  Include physical activity in your daily routine. Try a daily walk or other moderate aerobic exercise.  Manage stress. Find healthy ways to cope with the stressors, such as delegating tasks on your to-do list.  Practice relaxation techniques. Try deep breathing, yoga, massage and visualization.  Eat regularly. Eating regularly scheduled meals and maintaining a healthy diet might help prevent headaches. Also, drink plenty of fluids.  Follow a regular sleep schedule. Sleep deprivation might contribute to headaches Consider biofeedback. With this mind-body technique, you learn to control certain bodily functions - such as muscle tension, heart rate and blood pressure - to prevent headaches or reduce headache pain.    Proceed to emergency room if you experience new or worsening  symptoms or symptoms do not resolve, if you have new neurologic symptoms or if headache is severe, or for any concerning symptom.   Orders Placed This Encounter  Procedures  . Ambulatory referral to Physical Therapy    Sarina Ill, MD  [facility] [address] [place], [place] [address]  Phone [phone] Fax [phone]  A total of 25 minutes was spent face-to-face with this patient. Over half this time was spent on counseling patient on the occipital headache diagnosis and different diagnostic and therapeutic options available.

## 2017-06-02 NOTE — Patient Instructions (Signed)
Gabapentin capsules or tablets What is this medicine?  GABAPENTIN (GA ba pen tin) is used to control partial seizures in adults with epilepsy. It is also used to treat certain types of nerve pain and headaches.  This medicine may be used for other purposes; ask your health care provider or pharmacist if you have questions. COMMON BRAND NAME(S): Active-PAC with Gabapentin, Gabarone, Neurontin What should I tell my health care provider before I take this medicine? They need to know if you have any of these conditions: -kidney disease -suicidal thoughts, plans, or attempt; a previous suicide attempt by you or a family member -an unusual or allergic reaction to gabapentin, other medicines, foods, dyes, or preservatives -pregnant or trying to get pregnant -breast-feeding How should I use this medicine? Take this medicine by mouth with a glass of water. Follow the directions on the prescription label. You can take it with or without food. If it upsets your stomach, take it with food.Take your medicine at regular intervals. Do not take it more often than directed. Do not stop taking except on your doctor's advice. If you are directed to break the 600 or 800 mg tablets in half as part of your dose, the extra half tablet should be used for the next dose. If you have not used the extra half tablet within 28 days, it should be thrown away. A special MedGuide will be given to you by the pharmacist with each prescription and refill. Be sure to read this information carefully each time. Talk to your pediatrician regarding the use of this medicine in children. Special care may be needed. Overdosage: If you think you have taken too much of this medicine contact a poison control center or emergency room at once. NOTE: This medicine is only for you. Do not share this medicine with others. What if I miss a dose? If you miss a dose, take it as soon as you can. If it is almost time for your next dose, take only  that dose. Do not take double or extra doses. What may interact with this medicine? Do not take this medicine with any of the following medications: -other gabapentin products This medicine may also interact with the following medications: -alcohol -antacids -antihistamines for allergy, cough and cold -certain medicines for anxiety or sleep -certain medicines for depression or psychotic disturbances -homatropine; hydrocodone -naproxen -narcotic medicines (opiates) for pain -phenothiazines like chlorpromazine, mesoridazine, prochlorperazine, thioridazine This list may not describe all possible interactions. Give your health care provider a list of all the medicines, herbs, non-prescription drugs, or dietary supplements you use. Also tell them if you smoke, drink alcohol, or use illegal drugs. Some items may interact with your medicine. What should I watch for while using this medicine? Visit your doctor or health care professional for regular checks on your progress. You may want to keep a record at home of how you feel your condition is responding to treatment. You may want to share this information with your doctor or health care professional at each visit. You should contact your doctor or health care professional if your seizures get worse or if you have any new types of seizures. Do not stop taking this medicine or any of your seizure medicines unless instructed by your doctor or health care professional. Stopping your medicine suddenly can increase your seizures or their severity. Wear a medical identification bracelet or chain if you are taking this medicine for seizures, and carry a card that lists all your medications. You  may get drowsy, dizzy, or have blurred vision. Do not drive, use machinery, or do anything that needs mental alertness until you know how this medicine affects you. To reduce dizzy or fainting spells, do not sit or stand up quickly, especially if you are an older patient.  Alcohol can increase drowsiness and dizziness. Avoid alcoholic drinks. Your mouth may get dry. Chewing sugarless gum or sucking hard candy, and drinking plenty of water will help. The use of this medicine may increase the chance of suicidal thoughts or actions. Pay special attention to how you are responding while on this medicine. Any worsening of mood, or thoughts of suicide or dying should be reported to your health care professional right away. Women who become pregnant while using this medicine may enroll in the [place] Pregnancy Registry by calling [phone]. This registry collects information about the safety of antiepileptic drug use during pregnancy. What side effects may I notice from receiving this medicine? Side effects that you should report to your doctor or health care professional as soon as possible: -allergic reactions like skin rash, itching or hives, swelling of the face, lips, or tongue -worsening of mood, thoughts or actions of suicide or dying Side effects that usually do not require medical attention (report to your doctor or health care professional if they continue or are bothersome): -constipation -difficulty walking or controlling muscle movements -dizziness -nausea -slurred speech -tiredness -tremors -weight gain This list may not describe all possible side effects. Call your doctor for medical advice about side effects. You may report side effects to FDA at 1-800-FDA-1088. Where should I keep my medicine? Keep out of reach of children. This medicine may cause accidental overdose and death if it taken by other adults, children, or pets. Mix any unused medicine with a substance like cat litter or coffee grounds. Then throw the medicine away in a sealed container like a sealed bag or a coffee can with a lid. Do not use the medicine after the expiration date. Store at room temperature between 15 and 30 degrees C (59 and 86 degrees  F). NOTE: This sheet is a summary. It may not cover all possible information. If you have questions about this medicine, talk to your doctor, pharmacist, or health care provider.  2018 Elsevier/Gold Standard (2013-08-19 15:26:50)

## 2017-06-03 DIAGNOSIS — M5481 Occipital neuralgia: Secondary | ICD-10-CM | POA: Insufficient documentation

## 2017-06-08 ENCOUNTER — Telehealth: Payer: Self-pay | Admitting: Family Medicine

## 2017-06-08 NOTE — Telephone Encounter (Signed)
Copied from [place] [id number]. 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 Pt wants to know that if she need continuous follow up, what does she do if she can not get an appt with pcp? Does she just continue seeing other providers? Does she transfer care to someone else? Please call pt back at [phone]  >> Jun 08, 2017  4:38 PM Lindsay Domino, LPN wrote: Provider does not have a late night that he works anmd is off on Tuesdays at this time. Patient will have to see other providers until she is off or gets off early on Tuesday.    Provider does work at [employer] on some Saturdays. Called patient to inform. States she wors on Saturdays as well. States she will change providers since she cannot see Dr. Nani Wade because of their schedules.

## 2017-06-09 NOTE — Telephone Encounter (Signed)
If my current available hours are not feasible with her job, it is reasonable to change.

## 2017-06-17 ENCOUNTER — Encounter: Payer: Self-pay | Admitting: Family Medicine

## 2017-06-17 ENCOUNTER — Ambulatory Visit (INDEPENDENT_AMBULATORY_CARE_PROVIDER_SITE_OTHER): Payer: 59 | Admitting: Family Medicine

## 2017-06-17 VITALS — BP 122/80 | HR 69 | Temp 98.4°F | Ht 66.0 in | Wt 244.0 lb

## 2017-06-17 DIAGNOSIS — Z23 Encounter for immunization: Secondary | ICD-10-CM | POA: Diagnosis not present

## 2017-06-17 DIAGNOSIS — Z0184 Encounter for antibody response examination: Secondary | ICD-10-CM

## 2017-06-17 MED FILL — SYNTHROID 150 MCG TABLET: 150 | 90 days supply | Qty: 90 | Fill #1

## 2017-06-17 MED FILL — LISINOPRIL 20 MG TABLET: 20 | 30 days supply | Qty: 30 | Fill #2

## 2017-06-17 NOTE — Progress Notes (Signed)
Pre visit review using our clinic review tool, if applicable. No additional management support is needed unless otherwise documented below in the visit note. 

## 2017-06-17 NOTE — Addendum Note (Signed)
Addended by: Harl Bowie on: 06/17/2017 11:47 AM   Modules accepted: Orders

## 2017-06-17 NOTE — Progress Notes (Signed)
Chief Complaint  Patient presents with  . Follow-up    discuss immunizations    Subjective:  Patient is a 51 y.o. female here for discussion of immunizations.  She is starting a new job soon and needs records of immunizations. Not sure which ones, was just told "childhood ones". She believes she needs a tetanus. She tried to ask her mother about it, but mom has no idea where it may be. 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 Pt has no issue with getting re-immunized or having labs drawn.      Objective: BP 122/80 (BP Location: Left Arm, Patient Position: Sitting, Cuff Size: Large)   Pulse 69   Temp 98.4 F (36.9 C) (Oral)   Ht 5\' 6"  (1.676 m)   Wt 244 lb (110.7 kg)   SpO2 98%   BMI 39.38 kg/m  General: Awake, appears stated age Lungs: No accessory muscle use Psych: Age appropriate judgment and insight, normal affect and mood  Assessment and Plan: Immunity status testing - Plan: Varicella zoster antibody, IgG, Measles/Mumps/Rubella Immunity, Hepatitis B Surface AntiBODY, Poliovirus antibodies, types 1, 2, and 3, Pneumococcal Antibodies, IgG  Orders as above. It does not sound like she is able to get a hold of the original records, will test for immunity to show work as alternative. She will let us know if she needs more and we can schedule lab visit with the orders.  F/u prn.  The patient voiced understanding and agreement to the plan.  Greater than 15 minutes were spent face to face with the patient with greater than 50% of this time spent counseling on testing for immunity, coordinating care for follow up if she needs more testing.    Regina, DO 06/17/17  11:13 AM

## 2017-06-17 NOTE — Addendum Note (Signed)
Addended by: Sharon Seller B on: 06/17/2017 11:21 AM   Modules accepted: Orders

## 2017-06-17 NOTE — Addendum Note (Signed)
Addended by: Harl Bowie on: 06/17/2017 11:31 AM   Modules accepted: Orders

## 2017-06-17 NOTE — Patient Instructions (Addendum)
Let me know if you need any other proof of immunity and we can order other testing if need be.   Let us know if you need anything.

## 2017-06-18 ENCOUNTER — Other Ambulatory Visit: Payer: Self-pay | Admitting: Family Medicine

## 2017-06-18 DIAGNOSIS — Z0184 Encounter for antibody response examination: Secondary | ICD-10-CM

## 2017-06-18 LAB — MEASLES/MUMPS/RUBELLA IMMUNITY
Mumps IgG: >300 AU/mL
Rubella: 1.56 index
Rubeola IgG: 56.5 AU/mL

## 2017-06-18 LAB — HEPATITIS B SURFACE ANTIBODY,QUALITATIVE: Hep B S Ab: NONREACTIVE

## 2017-06-18 LAB — VARICELLA ZOSTER ANTIBODY, IGG: Varicella IgG: 717.9 index

## 2017-06-19 ENCOUNTER — Encounter: Payer: Self-pay | Admitting: Obstetrics & Gynecology

## 2017-06-19 ENCOUNTER — Ambulatory Visit (INDEPENDENT_AMBULATORY_CARE_PROVIDER_SITE_OTHER): Payer: 59

## 2017-06-19 ENCOUNTER — Ambulatory Visit (INDEPENDENT_AMBULATORY_CARE_PROVIDER_SITE_OTHER): Payer: 59 | Admitting: Obstetrics & Gynecology

## 2017-06-19 VITALS — Resp 16 | Ht 67.0 in

## 2017-06-19 DIAGNOSIS — Z01419 Encounter for gynecological examination (general) (routine) without abnormal findings: Secondary | ICD-10-CM

## 2017-06-19 DIAGNOSIS — Z1151 Encounter for screening for human papillomavirus (HPV): Secondary | ICD-10-CM | POA: Diagnosis not present

## 2017-06-19 DIAGNOSIS — Z1231 Encounter for screening mammogram for malignant neoplasm of breast: Secondary | ICD-10-CM | POA: Diagnosis not present

## 2017-06-19 DIAGNOSIS — Z124 Encounter for screening for malignant neoplasm of cervix: Secondary | ICD-10-CM

## 2017-06-19 NOTE — Progress Notes (Signed)
Subjective:     Lindsay Wade is a 51 y.o. female here for a routine exam.  Current complaints: none.  Husband works in Engineer, technical sales.  [place].  Has 2 teenage sons.  Has some hot flashes but does not want treatment.  History of ablation.  No bleeding.     Gynecologic History No LMP recorded. Patient has had an ablation. Contraception: tubal ligation Last Pap: nml in [place] several years ago.  Last mammogram: Nml per patient  Obstetric History OB History  Gravida Para Term Preterm AB Living  2 2 2         SAB TAB Ectopic Multiple Live Births          2    # Outcome Date GA Lbr Len/2nd Weight Sex Delivery Anes PTL Lv  2 Term      CS-LTranv     1 Term      CS-LTranv        The following portions of the patient's history were reviewed and updated as appropriate: allergies, current medications, past family history, past medical history, past social history, past surgical history and problem list.  Review of Systems Pertinent items noted in HPI and remainder of comprehensive ROS otherwise negative.    Objective:      Vitals:   06/19/17 0815  Resp: 16  Height: 5\' 7"  (1.702 m)   Vitals:  WNL General appearance: alert, cooperative and no distress  HEENT: Normocephalic, without obvious abnormality, atraumatic Eyes: negative Throat: lips, mucosa, and tongue normal; teeth and gums normal  Respiratory: Clear to auscultation bilaterally  CV: Regular rate and rhythm  Breasts:  Normal appearance, no masses or tenderness, nipples are inverted nad have been since she was a child.  GI: Soft, non-tender; bowel sounds normal; no masses,  no organomegaly  GU: External Genitalia:  Tanner V, no lesion Urethra:  No prolapse   Vagina: Pale pink, normal rugae, no blood or discharge  Cervix: No CMT, no lesion, pin point os  Uterus:  Normal size and contour, non tender, limited exam due to habitus  Adnexa: Normal, no masses, non tender.  Exam limited by habitus  Musculoskeletal: No edema,  redness or tenderness in the calves or thighs  Skin: No lesions or rash  Lymphatic: Axillary adenopathy: none     Psychiatric: Normal mood and behavior   Assessment:    Healthy female exam.    Plan:   Pap smear with cotesting Mammogram Follow up with PCP for routine care Colonoscopy at 76

## 2017-06-22 LAB — CYTOLOGY - PAP
Diagnosis: NEGATIVE
HPV: NOT DETECTED

## 2017-06-26 ENCOUNTER — Telehealth: Payer: Self-pay | Admitting: *Deleted

## 2017-06-26 LAB — POLIOVIRUS ANTIBODIES, TYPES 1, 2, AND 3
Poliovirus Ab Type 1: >=1:8 {titer}
Poliovirus Ab Type 3: >=1:8 {titer}

## 2017-06-26 NOTE — Telephone Encounter (Signed)
Received results from [facility] for Poliovirus titers; forwarded to provider/SLS 12/21

## 2017-06-28 ENCOUNTER — Encounter: Payer: Self-pay | Admitting: Obstetrics & Gynecology

## 2017-06-28 NOTE — Progress Notes (Signed)
Sent note to patient about neding mammogram.

## 2017-07-02 ENCOUNTER — Ambulatory Visit (INDEPENDENT_AMBULATORY_CARE_PROVIDER_SITE_OTHER): Payer: 59 | Admitting: Neurology

## 2017-07-02 DIAGNOSIS — R51 Headache: Secondary | ICD-10-CM

## 2017-07-02 DIAGNOSIS — R519 Headache, unspecified: Secondary | ICD-10-CM

## 2017-07-02 DIAGNOSIS — G4733 Obstructive sleep apnea (adult) (pediatric): Secondary | ICD-10-CM

## 2017-07-02 DIAGNOSIS — I10 Essential (primary) hypertension: Secondary | ICD-10-CM

## 2017-07-02 DIAGNOSIS — R0683 Snoring: Secondary | ICD-10-CM

## 2017-07-03 ENCOUNTER — Encounter: Payer: Self-pay | Admitting: Obstetrics & Gynecology

## 2017-07-03 ENCOUNTER — Other Ambulatory Visit: Payer: Self-pay | Admitting: Neurology

## 2017-07-03 DIAGNOSIS — Z789 Other specified health status: Secondary | ICD-10-CM

## 2017-07-03 DIAGNOSIS — G4733 Obstructive sleep apnea (adult) (pediatric): Secondary | ICD-10-CM

## 2017-07-03 DIAGNOSIS — R51 Headache: Secondary | ICD-10-CM

## 2017-07-03 DIAGNOSIS — R519 Headache, unspecified: Secondary | ICD-10-CM

## 2017-07-03 NOTE — Procedures (Signed)
PATIENT'S NAME:  Lindsay Wade, Lindsay Wade DOB:      10-26-1965      MR#:    [id number]     DATE OF RECORDING: 07/02/2017 REFERRING M.D.:  [name], M.D. Study Performed:  Split-Night Titration Study HISTORY:  Mrs. Mayor presents as a new patient to the sleep clinic but an established headache patient of Dr. Jaynee Eagles.  She reports that over the last 3 months she had escalating headaches which bothered her mostly in the morning.  For a while it was thought that if her blood pressure is better controlled her headaches would go away but this was not the case.  Dr. Jaynee Eagles would like to investigate if sleep apnea could be a cause.  The patient endorsed the Epworth Sleepiness Scale at 10 points   The patient's weight 245 pounds with a height of 66 (inches), resulting in a BMI of 39.3 kg/m2. The patient's neck circumference measured 17.25 inches.  CURRENT MEDICATIONS: Norvasc, Synthroid, Lisinopril, Nexium  PROCEDURE:  This is a multichannel digital polysomnogram utilizing the Somnostar 11.2 system.  Electrodes and sensors were applied and monitored per AASM Specifications.   EEG, EOG, Chin and Limb EMG, were sampled at 200 Hz.  ECG, Snore and Nasal Pressure, Thermal Airflow, Respiratory Effort, CPAP Flow and Pressure, Oximetry was sampled at 50 Hz. Digital video and audio were recorded.      BASELINE STUDY WITHOUT CPAP RESULTS: Lights Out was at 22:40 and Lights On at 04:39.  Total recording time (TRT) was 136.5, with a total sleep time (TST) of 113.5 minutes.   The patient's sleep latency was 9 minutes.  REM latency was 0 minutes.  The sleep efficiency was 83.2 %.    SLEEP ARCHITECTURE: WASO (Wake after sleep onset) was 7 minutes, Stage N1 was 39 minutes, Stage N2 was 74.5 minutes, Stage N3 was 0 minutes and Stage R (REM sleep) was 0 minutes. The percentages were Stage N1 34.4%, Stage N2 65.6%, Stage N3 0% and Stage R (REM sleep) 0%.   RESPIRATORY ANALYSIS:  There were a total of 117 respiratory  events:  1 obstructive apnea, 3 central apneas and 0 mixed apneas with 113 hypopneas. The patient also had 0 respiratory event related arousals (RERAs).     The total APNEA/HYPOPNEA INDEX (AHI) was 61.9 /hour and the total RESPIRATORY DISTURBANCE INDEX was 61.9 /hour.  0 events occurred in REM sleep and 229 events in NREM. The non-REM AHI of 61.9 /hour. The patient spent 253.5 minutes sleep time in the supine position 19 minutes in non-supine. The supine AHI was 56.8 /hour versus a non-supine AHI of 87.5 /hour.  OXYGEN SATURATION & C02:  The wake baseline 02 saturation was 98%, with the lowest being 83%. Time spent below 89% saturation equaled 24 minutes. Overall, the average End Tidal CO2 during sleep 0.  Prior to CPAP, the average End Tidal CO2 during sleep 0 torr. During the therapeutic portion of the study, the average End Tidal CO2 during sleep 0 torr.  PERIODIC LIMB MOVEMENTS:    The patient had a total of 33 Periodic Limb Movements.  The Periodic Limb Movement (PLM) index was 17.4 /hour and the PLM Arousal index was 3.7 /hour.   The arousals were noted as: 23 were spontaneous, 7 were associated with PLMs, and 53 were associated with respiratory events. Audio and video analysis did not show any abnormal or unusual movements, behaviors, phonations or vocalizations, no nocturia noted. EKG was in keeping with normal sinus rhythm (NSR)  TITRATION STUDY WITH CPAP RESULTS:   CPAP was initiated at 5 cmH20 with heated humidity per AASM split night standards and pressure was advanced to 11 cm water- patient was switched form nasal interface to FFM as nasal airflow patency was poor. AHI varied between 34 and 4.4 on CPAP- and PAP was only poorly tolerated. The technologist switched to BiPAP at 13/9 and advanced pressure to 16/12 cm water, with best result at the last setting, 16/12 cm , Spo2 nadir 91% , sleep efficiency was 91.7%.    Total recording time (TRT) was 223.5 minutes, with a total sleep time  (TST) of 158.5 minutes. The patient's sleep latency was 49.5 minutes. REM latency was 58.5 minutes.  The sleep efficiency was 70.9 %.    SLEEP ARCHITECTURE: Wake after sleep was 35 minutes, Stage N1 39.5 minutes, Stage N2 47.5 minutes, Stage N3 44.5 minutes and Stage R (REM sleep) 27 minutes. The percentages were: Stage N1 24.9%, Stage N2 30.%, Stage N3 28.1% and Stage R (REM sleep) 17.0%. The sleep architecture was notable for REM rebound. The arousals were noted as: 21 were spontaneous, 0 were associated with PLMs, and 21 were still associated with respiratory events.  RESPIRATORY ANALYSIS:  There were a total of 20 respiratory events: 0 obstructive apneas, 0 central apneas and 0 mixed apneas with a total of 0 apneas and an apnea index (AI) of 0. There were 20 hypopneas with a hypopnea index of 7.6 /hour. The patient also had 6 respiratory event related arousals (RERAs).     The total APNEA/HYPOPNEA INDEX (AHI) was 7.6 /hour and the total RESPIRATORY DISTURBANCE INDEX was 9.8 /hour.  0 events occurred in REM sleep and 20 events in NREM. The REM AHI was 0 /hour versus a non-REM AHI of 9.1 /hour. The patient spent 100% of total sleep time in the supine position. The supine AHI was 7.6 /hour, versus a non-supine AHI of 0.0/hour.  OXYGEN SATURATION & C02:  The wake baseline 02 saturation was 98%, with the lowest being 87%. Time spent below 89% saturation equaled 0 minutes. CO2 could not be measured while on PAP therapy.  PERIODIC LIMB MOVEMENTS:    The patient had a total of 0 Periodic Limb Movements.  The patient took no further bathroom breaks Snoring was no longer noted. EKG was in keeping with normal sinus rhythm (NSR) Post-study, the patient indicated that sleep was less sound and more fragmented.       POLYSOMNOGRAPHY IMPRESSION :   1. Severe Obstructive Sleep Apnea (OSA) at AHI of 61.9 /hr. and loud snoring were found to cause multiple sleep arousals.   2. CPAP was poorly tolerated as  pressures were raised, but BiPAP did resolve OSA and snoring. 3. BiPAP was effective at a pressure of 16/12 cm water, heated humidity and with the use of a FFM, AirFit F 20, in small size.     RECOMMENDATIONS:  4. An AirFit F 20 FFM in medium size was used with heated humidity during this study. BiPAP was effective at a pressure of 16/12 cm water, heated humidity and adjust interface and heated humidity as needed.     1. Compliance to BiPAP therapy should be emphasized as 4 hours of nightly use.  Compliance, AHI and air leak information to be downloaded for objective assessment at 30 days, 180 days and annually thereafter.   2. Further information regarding OSA may be obtained from [employer] (www.sleepfoundation.org) or American Sleep Apnea Association (www.sleepapnea.org). 3. A follow  up appointment will be scheduled in the Sleep Clinic at [facility].      I certify that I have reviewed the entire raw data recording prior to the issuance of this report in accordance with the Standards of Accreditation of the American Academy of Sleep Medicine (AASM)      Larey Seat, M.D.  07-03-2017  Diplomat, American Board of Psychiatry and Neurology  Diplomat, [place] of Sleep Medicine Medical Director, [place] [facility]

## 2017-07-08 ENCOUNTER — Telehealth: Payer: Self-pay | Admitting: Neurology

## 2017-07-08 NOTE — Telephone Encounter (Signed)
-----   Message from Larey Seat, MD sent at 07/03/2017 12:27 PM EST -----       POLYSOMNOGRAPHY IMPRESSION :   1. Severe Obstructive Sleep Apnea (OSA) at AHI of 61.9 /hr. and loud snoring were found to cause multiple sleep arousals.   2. CPAP was poorly tolerated as pressures were raised, but BiPAP did resolve OSA and snoring. 3. BiPAP was effective at a pressure of 16/12 cm water, heated humidity and with the use of a FFM, AirFit F 20, in small size.     RECOMMENDATIONS:  4. An AirFit F 20 FFM in medium size was used with heated humidity during this study. BiPAP was effective at a pressure of 16/12 cm water, heated humidity and adjust interface and heated humidity as needed.     1. Compliance to BiPAP therapy should be emphasized as 4 hours of nightly use.  Compliance, AHI and air leak information to be downloaded for objective assessment at 30 days, 180 days and annually thereafter.

## 2017-07-08 NOTE — Telephone Encounter (Signed)
Called patient to discuss sleep study results. No answer at this time. LVM for the patient to call back.   

## 2017-07-09 NOTE — Telephone Encounter (Signed)
I called pt. I advised pt that Dr. Brett Fairy reviewed their sleep study results and found that pt has severe sleep apnea. Dr. Brett Fairy recommends that pt starts Bipap. I reviewed PAP compliance expectations with the pt. Pt is agreeable to starting a BiPAP. I advised pt that an order will be sent to a DME, [facility], and [facility] will call the pt within about one week after they file with the pt's insurance. [facility] will show the pt how to use the machine, fit for masks, and troubleshoot the BiPAP if needed. A follow up appt was made for insurance purposes with Cecille Rubin, NP on October 06, 2017 at 3:15 pm. Pt verbalized understanding to arrive 15 minutes early and bring their BiPAP. A letter with all of this information in it will be mailed to the pt as a reminder. I verified with the pt that the address we have on file is correct. Pt verbalized understanding of results. Pt had no questions at this time but was encouraged to call back if questions arise.

## 2017-07-10 NOTE — Telephone Encounter (Signed)
Pt has called back and has located her new insurance card and confirmed there are changes to the insurance. It is [employer],  Rx [employer]#[id number]  RxPCN#[id number] Rx [id number] Pt states everything else is the same as what was on previous years insurance card.  Pt states she will be very busy on Monday but she is asking that [facility] calls on mobile and leaves message re: what if anything else is needed

## 2017-07-13 NOTE — Telephone Encounter (Signed)
I have forwarded this new information to [facility] for the patient. At this time the patient will wait to hear from [place].

## 2017-07-27 ENCOUNTER — Ambulatory Visit: Payer: 59 | Admitting: Family Medicine

## 2017-07-27 DIAGNOSIS — G4733 Obstructive sleep apnea (adult) (pediatric): Secondary | ICD-10-CM | POA: Diagnosis not present

## 2017-07-29 ENCOUNTER — Ambulatory Visit: Payer: 59 | Admitting: Family Medicine

## 2017-07-31 ENCOUNTER — Other Ambulatory Visit: Payer: Self-pay | Admitting: Family Medicine

## 2017-07-31 DIAGNOSIS — I10 Essential (primary) hypertension: Secondary | ICD-10-CM

## 2017-07-31 MED ORDER — LISINOPRIL 20 MG PO TABS: 20.0000 mg | ORAL_TABLET | Freq: Every day | ORAL | 2 refills | Status: DC

## 2017-07-31 MED ORDER — AMLODIPINE BESYLATE 5 MG PO TABS: 5.0000 mg | ORAL_TABLET | Freq: Every day | ORAL | 2 refills | Status: DC

## 2017-07-31 MED FILL — LISINOPRIL 20 MG TABLET: 20 | 30 days supply | Qty: 30 | Fill #0

## 2017-07-31 MED FILL — AMLODIPINE BESYLATE 5 MG TA: 5 | 30 days supply | Qty: 30 | Fill #0

## 2017-07-31 NOTE — Telephone Encounter (Signed)
Copied from [place] [id number]. Topic: General - Other >> Jul 31, 2017  9:40 AM Carolyn Stare wrote:  Pt is out of he BP med and has an appt scheduled for Tuesday and is asking for few pills until she see the doctor   Pharmacy  [facility]   PT PHONE NUMBER [phone]   CELL [phone]

## 2017-08-04 ENCOUNTER — Encounter: Payer: Self-pay | Admitting: *Deleted

## 2017-08-04 ENCOUNTER — Ambulatory Visit: Payer: 59 | Admitting: Family Medicine

## 2017-08-04 ENCOUNTER — Encounter: Payer: Self-pay | Admitting: Family Medicine

## 2017-08-04 VITALS — BP 124/88 | HR 69 | Temp 98.0°F | Resp 16 | Ht 67.0 in | Wt 246.6 lb

## 2017-08-04 DIAGNOSIS — Z23 Encounter for immunization: Secondary | ICD-10-CM

## 2017-08-04 DIAGNOSIS — I10 Essential (primary) hypertension: Secondary | ICD-10-CM | POA: Diagnosis not present

## 2017-08-04 MED ORDER — TUBERCULIN PPD 5 UNIT/0.1ML ID SOLN
5.0000 [IU] | Freq: Once | INTRADERMAL | Status: AC
  Administered 2017-08-04: 5 [IU] via INTRADERMAL

## 2017-08-04 NOTE — Assessment & Plan Note (Signed)
Well controlled, no changes to meds. Encouraged heart healthy diet such as the DASH diet and exercise as tolerated.  °

## 2017-08-04 NOTE — Progress Notes (Signed)
Patient ID: Lindsay Wade, female   DOB: 02/09/1966, 52 y.o.   MRN: [id number]    Subjective:  I acted as a Education administrator for Dr. Carollee Herter.  Lindsay Wade, [place]   Patient ID: Lindsay Wade, female    DOB: 1966-01-10, 52 y.o.   MRN: [id number]  Chief Complaint  Patient presents with  . Hypertension    HPI  Patient is in today for follow up blood pressure.-- pt needed paper work filled out for work before Monday -- she needs hep b booster and she need TB  Patient Care Team: [name], Crosby Oyster, DO as PCP - General (Family Medicine)   Past Medical History:  Diagnosis Date  . Essential hypertension 04/15/2017  . Headache   . Hypothyroid   . Hypothyroidism   . MRSA infection     Past Surgical History:  Procedure Laterality Date  . BTS    . CESAREAN SECTION    . TONSILLECTOMY    . uterine ablation      Family History  Problem Relation Age of Onset  . Hypertension Mother   . Hypertension Father   . Parkinsonism Father   . Hyperlipidemia Sister   . Thyroid disease Neg Hx     Social History   Socioeconomic History  . Marital status: Married    Spouse name: Not on file  . Number of children: 2  . Years of education: 64  . Highest education level: High school graduate  Social Needs  . Financial resource strain: Not on file  . Food insecurity - worry: Not on file  . Food insecurity - inability: Not on file  . Transportation needs - medical: Not on file  . Transportation needs - non-medical: Not on file  Occupational History  . Occupation: stay at home mom   Tobacco Use  . Smoking status: Never Smoker  . Smokeless tobacco: Never Used  Substance and Sexual Activity  . Alcohol use: Yes    Comment: social drinker; very rare   . Drug use: No  . Sexual activity: Yes    Partners: Male    Birth control/protection: Surgical  Other Topics Concern  . Not on file  Social History Narrative   Lives at home with her husband and 2 sons   Right handed   3-4 cups of caffeine  daily    Outpatient Medications Prior to Visit  Medication Sig Dispense Refill  . amLODipine (NORVASC) 5 MG tablet Take 1 tablet (5 mg total) by mouth daily. 30 tablet 2  . gabapentin (NEURONTIN) 300 MG capsule Take 1 capsule (300 mg total) by mouth 3 (three) times daily as needed. 90 capsule 11  . levothyroxine (SYNTHROID, LEVOTHROID) 150 MCG tablet Take 1 tablet (150 mcg total) by mouth daily before breakfast. 90 tablet 4  . lisinopril (PRINIVIL,ZESTRIL) 20 MG tablet Take 1 tablet (20 mg total) by mouth daily. 30 tablet 2   No facility-administered medications prior to visit.     No Known Allergies  Review of Systems  Constitutional: Negative for fever and malaise/fatigue.  HENT: Negative for congestion.   Eyes: Negative for blurred vision.  Respiratory: Negative for cough and shortness of breath.   Cardiovascular: Negative for chest pain, palpitations and leg swelling.  Gastrointestinal: Negative for vomiting.  Musculoskeletal: Negative for back pain.  Skin: Negative for rash.  Neurological: Negative for loss of consciousness and headaches.       Objective:    Physical Exam  Constitutional: She is oriented to person, place, and time.  She appears well-developed and well-nourished.  HENT:  Head: Normocephalic and atraumatic.  Eyes: Conjunctivae and EOM are normal.  Neck: Normal range of motion. Neck supple. No JVD present. Carotid bruit is not present. No thyromegaly present.  Cardiovascular: Normal rate, regular rhythm and normal heart sounds.  No murmur heard. Pulmonary/Chest: Effort normal and breath sounds normal. No respiratory distress. She has no wheezes. She has no rales. She exhibits no tenderness.  Musculoskeletal: She exhibits no edema.  Neurological: She is alert and oriented to person, place, and time.  Psychiatric: She has a normal mood and affect.  Nursing note and vitals reviewed.   BP 124/88 (BP Location: Left Arm, Cuff Size: Large)   Pulse 69   Temp  98 F (36.7 C) (Oral)   Resp 16   Ht 5\' 7"  (1.702 m)   Wt 246 lb 9.6 oz (111.9 kg)   SpO2 98%   BMI 38.62 kg/m  Wt Readings from Last 3 Encounters:  08/04/17 246 lb 9.6 oz (111.9 kg)  06/17/17 244 lb (110.7 kg)  06/02/17 244 lb 9.6 oz (110.9 kg)   BP Readings from Last 3 Encounters:  08/04/17 124/88  06/17/17 122/80  06/02/17 (!) 130/91     Immunization History  Administered Date(s) Administered  . Hepatitis B, adult 08/04/2017  . Influenza,inj,Quad PF,6+ Mos 04/06/2017  . PPD Test 08/04/2017  . Tdap 06/17/2017    Health Maintenance  Topic Date Due  . HIV Screening  05/10/1983  . PAP SMEAR  06/19/2020  . TETANUS/TDAP  06/18/2027  . INFLUENZA VACCINE  Completed    Lab Results  Component Value Date   GLUCOSE 90 04/27/2017   NA 138 04/27/2017   K 4.2 04/27/2017   CL 104 04/27/2017   CREATININE 0.70 04/27/2017   BUN 15 04/27/2017   CO2 26 04/27/2017   TSH 0.47 02/18/2017    Lab Results  Component Value Date   TSH 0.47 02/18/2017   No results found for: WBC, HGB, HCT, MCV, PLT Lab Results  Component Value Date   NA 138 04/27/2017   K 4.2 04/27/2017   CO2 26 04/27/2017   GLUCOSE 90 04/27/2017   BUN 15 04/27/2017   CREATININE 0.70 04/27/2017   CALCIUM 9.3 04/27/2017   GFR 94.54 04/27/2017   No results found for: CHOL No results found for: HDL No results found for: LDLCALC No results found for: TRIG No results found for: CHOLHDL No results found for: HGBA1C       Assessment & Plan:   Problem List Items Addressed This Visit      Unprioritized   Essential hypertension - Primary    Well controlled, no changes to meds. Encouraged heart healthy diet such as the DASH diet and exercise as tolerated.        Other Visit Diagnoses    Need for hepatitis B booster vaccination       Relevant Orders   Hepatitis B vaccine adult IM (Completed)   Need for tuberculosis vaccination       Relevant Medications   tuberculin injection 5 Units    form filled  out--- rto 48 hours to read TB F/u PCP   I am having Teodora Medici maintain her levothyroxine, gabapentin, lisinopril, and amLODipine. We administered tuberculin. We will continue to administer tuberculin.  Meds ordered this encounter  Medications  . tuberculin injection 5 Units   CMA served as scribe during this visit. History, Physical and Plan performed by medical provider. Documentation and orders reviewed  and attested to.  Ann Held, DO

## 2017-08-04 NOTE — Patient Instructions (Signed)

## 2017-08-06 ENCOUNTER — Ambulatory Visit (INDEPENDENT_AMBULATORY_CARE_PROVIDER_SITE_OTHER): Payer: 59

## 2017-08-06 DIAGNOSIS — Z111 Encounter for screening for respiratory tuberculosis: Secondary | ICD-10-CM

## 2017-08-06 LAB — TB SKIN TEST
Induration: 0 mm
TB Skin Test: NEGATIVE

## 2017-08-06 NOTE — Progress Notes (Signed)
Pt came in on 08/06/17 at 1445 for PPD reading. Results negative. Pt's paperwork for school signed off by RN. Gary Fleet

## 2017-08-14 NOTE — Telephone Encounter (Signed)
Copied from [place]. Topic: Appointment Scheduling - Scheduling Inquiry for Clinic >> Aug 13, 2017 12:47 PM Bea Graff, NT wrote: Reason for CRM: Pt calling needing to schedule an appt for a Tdap vaccine with pertussis in the vaccine as well and requesting a her 2nd TB test. Please call pt to schedule an appt.  Patient informed tdap done already 06/2017 Scheduled second tb test

## 2017-08-21 DIAGNOSIS — G4733 Obstructive sleep apnea (adult) (pediatric): Secondary | ICD-10-CM | POA: Diagnosis not present

## 2017-08-25 ENCOUNTER — Ambulatory Visit (INDEPENDENT_AMBULATORY_CARE_PROVIDER_SITE_OTHER): Payer: 59 | Admitting: *Deleted

## 2017-08-25 DIAGNOSIS — Z111 Encounter for screening for respiratory tuberculosis: Secondary | ICD-10-CM | POA: Diagnosis not present

## 2017-08-25 NOTE — Progress Notes (Signed)
Pre visit review using our clinic review tool, if applicable. No additional management support is needed unless otherwise documented below in the visit note.  Pt here for 2nd TB skin test per authorization of 08/11/17 pt email.   PPD 0.68mL given ID to right forearm. Pt will return Thursday around 2:30pm for skin test reading.   Encounter routed to doc of the day since PCP is out of the office today.

## 2017-08-27 ENCOUNTER — Ambulatory Visit: Payer: 59

## 2017-08-27 DIAGNOSIS — G4733 Obstructive sleep apnea (adult) (pediatric): Secondary | ICD-10-CM | POA: Diagnosis not present

## 2017-08-27 DIAGNOSIS — Z111 Encounter for screening for respiratory tuberculosis: Secondary | ICD-10-CM

## 2017-08-27 LAB — TB SKIN TEST
Induration: 0 mm
TB Skin Test: NEGATIVE

## 2017-08-27 NOTE — Progress Notes (Signed)
Pt returned to office today to have TB skin test read.   PPD: 34mm induration on R forearm. No chest x-ray needed. Documentation provided to Pt.

## 2017-09-22 MED FILL — SYNTHROID 150 MCG TABLET: 150 | 90 days supply | Qty: 90 | Fill #2

## 2017-09-22 MED FILL — LISINOPRIL 20 MG TABLET: 20 | 30 days supply | Qty: 30 | Fill #1

## 2017-09-22 MED FILL — AMLODIPINE BESYLATE 5 MG TA: 5 | 30 days supply | Qty: 30 | Fill #1

## 2017-09-24 DIAGNOSIS — R140 Abdominal distension (gaseous): Secondary | ICD-10-CM | POA: Diagnosis not present

## 2017-09-24 DIAGNOSIS — G4733 Obstructive sleep apnea (adult) (pediatric): Secondary | ICD-10-CM | POA: Diagnosis not present

## 2017-09-24 DIAGNOSIS — K219 Gastro-esophageal reflux disease without esophagitis: Secondary | ICD-10-CM | POA: Diagnosis not present

## 2017-10-04 ENCOUNTER — Encounter: Payer: Self-pay | Admitting: Nurse Practitioner

## 2017-10-05 NOTE — Progress Notes (Signed)
[name]  PATIENT: Lindsay Wade DOB: June 30, 1966   REASON FOR VISIT: Follow-up for severe obstructive sleep apnea with initial BiPAP compliance  HISTORY FROM: Patient    HISTORY OF PRESENT ILLNESS:UPDATE 4/2/2019CM  Ms.13, 52 year old female returns for follow-up for initial BiPAP compliance after being diagnosed with severe obstructive sleep apnea.  She says I do not like using the machine however it makes me feel much better.  CPAP compliance dated 09/05/2017 -10/04/2017 shows compliance greater than 4 hours at 100%.  Average usage 7 hours 26 minutes.  Set pressure of 12-16 cm AHI 2.4 ESS 1 fatigue severity score 15.  She returns for reevaluation 05/26/17 CDMichele Wade is a married, right handed 52 y.o. female of [place] descent , seen here as  a referral  from Dr. Jaynee Wade for a sleep consultation.  Chief complaint according to patient : " I have bad, bad headaches in the morning and high blood pressure. "  Lindsay Wade presents today as a new patient to the sleep clinic but an established headache patient of Dr. Jaynee Wade.  She reports that over the last 3 months she had escalating headaches which bothered her mostly in the morning.  For a while it was thought that if her blood pressure is better controlled her headaches would go away but this was not the case.  Dr. Jaynee Wade obtained an MRI of the brain and also cervical spine images which returned normal.  The patient had mentioned that her mother suffered from meningioma.  The Patient also describes that she feels hot as if having a fever or viral infection, she feels weak and tired and would like to rest with her headaches.  The headaches are present in the morning but they do not wake her from sleep.  Sometimes a Wade more tension-like other times pounding and pulsatile, she is not bothered by movement light or loud sounds.  She is not squeezy.  There has been no dizziness, no shortness of breath  and no chest pain. Her husband has told her that she is a family with snoring and 1 of our avenues to evaluate her headache will be to rule out sleep apnea.  Lindsay Wade informed me that she had previously been diagnosed with sleep apnea in August 2014 in [place], [place].  [facility] for sleep studies.  I have only a copy for a CPAP titration which does not "the results of the baseline sleep study.  "It was a baseline RDI and not AHI.  13.2/h with an Epworth sleepiness score at the time endorsed at 13 points.  CPAP titration explored pressures beginning at 7 cm and changed finally to BiPAP at 10/6 centimeter water.  The patient was deemed intolerant despite use of various mask styles, types and sizes.  The patient requested to end the study prematurely and left the lab.  Sleep habits are as follows: Her husband has moved on to another bedroom as he is bothered by her snoring.  The usual bedtime for the patient is 10:30 PM and she does not have trouble initiating sleep.  She had a fullness feeling in her throat, felt that her airway was partially blocked and began sleeping more reclined rather than flat.  This has helped.  She sleeps on her bed- but now in supine on one pillow.  She describes her bedroom is cool, quiet and dark - conducive to sleep. She gets up twice to go to the bathroom, around 4 and 6 AM.  She rises at 6.30 AM spontaneously, on weekends until 8 AM. Daytime naps are unscheduled- and she falls asleep after lunch.   REVIEW OF SYSTEMS: Full 14 system review of systems performed and notable only for those listed, all others are neg:  Constitutional: neg  Cardiovascular: neg Ear/Nose/Throat: neg  Skin: neg Eyes: neg Respiratory: neg Gastroitestinal: neg  Hematology/Lymphatic: neg  Endocrine: neg Musculoskeletal:neg Allergy/Immunology: neg Neurological: neg Psychiatric: neg Sleep : Severe obstructive sleep apnea with BiPAP   ALLERGIES: No Known  Allergies  HOME MEDICATIONS: Outpatient Medications Prior to Visit  Medication Sig Dispense Refill  . amLODipine (NORVASC) 5 MG tablet Take 1 tablet (5 mg total) by mouth daily. 30 tablet 2  . levothyroxine (SYNTHROID, LEVOTHROID) 150 MCG tablet Take 1 tablet (150 mcg total) by mouth daily before breakfast. 90 tablet 4  . lisinopril (PRINIVIL,ZESTRIL) 20 MG tablet Take 1 tablet (20 mg total) by mouth daily. 30 tablet 2  . SIMETHICONE EXTRA STRENGTH PO Take by mouth. Takes BID prn    . gabapentin (NEURONTIN) 300 MG capsule Take 1 capsule (300 mg total) by mouth 3 (three) times daily as needed. (Patient not taking: Reported on 10/06/2017) 90 capsule 11   No facility-administered medications prior to visit.     PAST MEDICAL HISTORY: Past Medical History:  Diagnosis Date  . Essential hypertension 04/15/2017  . Headache   . Hypothyroid   . Hypothyroidism   . MRSA infection     PAST SURGICAL HISTORY: Past Surgical History:  Procedure Laterality Date  . BTS    . CESAREAN SECTION    . TONSILLECTOMY    . uterine ablation      FAMILY HISTORY: Family History  Problem Relation Age of Onset  . Hypertension Mother   . Hypertension Father   . Parkinsonism Father   . Hyperlipidemia Sister   . Thyroid disease Neg Hx     SOCIAL HISTORY: Social History   Socioeconomic History  . Marital status: Married    Spouse name: Not on file  . Number of children: 2  . Years of education: 37  . Highest education level: High school graduate  Occupational History  . Occupation: stay at home mom   Social Needs  . Financial resource strain: Not on file  . Food insecurity:    Worry: Not on file    Inability: Not on file  . Transportation needs:    Medical: Not on file    Non-medical: Not on file  Tobacco Use  . Smoking status: Never Smoker  . Smokeless tobacco: Never Used  Substance and Sexual Activity  . Alcohol use: Yes    Comment: social drinker; very rare   . Drug use: No  . Sexual  activity: Yes    Partners: Male    Birth control/protection: Surgical  Lifestyle  . Physical activity:    Days per week: Not on file    Minutes per session: Not on file  . Stress: Not on file  Relationships  . Social connections:    Talks on phone: Not on file    Gets together: Not on file    Attends religious service: Not on file    Active member of club or organization: Not on file    Attends meetings of clubs or organizations: Not on file    Relationship status: Not on file  . Intimate partner violence:    Fear of current or ex partner: Not on file    Emotionally abused: Not on file  Physically abused: Not on file    Forced sexual activity: Not on file  Other Topics Concern  . Not on file  Social History Narrative   Lives at home with her husband and 2 sons   Right handed   3-4 cups of caffeine daily     PHYSICAL EXAM  Vitals:   10/06/17 1512  BP: 115/75  Pulse: 70  Weight: 252 lb 9.6 oz (114.6 kg)  Height: 5\' 7"  (1.702 m)   Body mass index is 39.56 kg/m.  Generalized: Well developed, morbidly obese female in no acute distress  Head: normocephalic and atraumatic,. Oropharynx benign mallampati 4 Neck: Supple, circumference 17  Musculoskeletal: No deformity   Neurological examination   Mentation: Alert oriented to time, place, history taking. Attention span and concentration appropriate. Recent and remote memory intact.  Follows all commands speech and language fluent.   Cranial nerve II-XII: .Pupils were equal round reactive to light extraocular movements were full, visual field were full on confrontational test. Facial sensation and strength were normal. hearing was intact to finger rubbing bilaterally. Uvula tongue midline. head turning and shoulder shrug were normal and symmetric.Tongue protrusion into cheek strength was normal. Motor: normal bulk and tone, full strength in the BUE, BLE, fine finger movements normal, no pronator drift. No focal  weakness Sensory: normal and symmetric to light touch,  Coordination: finger-nose-finger, heel-to-shin bilaterally, no dysmetria Gait and Station: Rising up from seated position without assistance, normal stance,  moderate stride, good arm swing, smooth turning, able to perform tiptoe, and heel walking without difficulty. Tandem gait is steady  DIAGNOSTIC DATA (LABS, IMAGING, TESTING) - I reviewed patient records, labs, notes, testing and imaging myself where available.      Component Value Date/Time   NA 138 04/27/2017 1025   K 4.2 04/27/2017 1025   CL 104 04/27/2017 1025   CO2 26 04/27/2017 1025   GLUCOSE 90 04/27/2017 1025   BUN 15 04/27/2017 1025   CREATININE 0.70 04/27/2017 1025   CALCIUM 9.3 04/27/2017 1025    Lab Results  Component Value Date   TSH 0.47 02/18/2017      ASSESSMENT AND PLAN  52 y.o. year old female  has a past medical history of Essential hypertension (04/15/2017), Headache, Hypothyroid, Hypothyroidism, and severe obstructive sleep apnea here for initial BiPAP compliance.Data dated 09/05/2017 -10/04/2017 shows compliance greater than 4 hours at 100%.  Average usage 7 hours 26 minutes.  Set pressure of 12-16 cm AHI 2.4 ESS 1 fatigue severity score 15   PLAN: BiPAP compliance greater than 4 hours 100% reviewed data with patient Continue same settings Slow steady weight loss Follow-up in 6 months for repeat compliance Dennie Bible, [facility], [facility], APRN  [facility] [address], [place], [place] [address] [phone]

## 2017-10-06 ENCOUNTER — Ambulatory Visit: Payer: 59 | Admitting: Nurse Practitioner

## 2017-10-06 ENCOUNTER — Encounter: Payer: Self-pay | Admitting: Nurse Practitioner

## 2017-10-06 DIAGNOSIS — G4733 Obstructive sleep apnea (adult) (pediatric): Secondary | ICD-10-CM | POA: Diagnosis not present

## 2017-10-06 NOTE — Patient Instructions (Addendum)
BiPAP compliance greater than 4 hours 100% Continue same settings Follow-up in 6 months

## 2017-10-07 NOTE — Progress Notes (Signed)
I agree with the assessment and plan as directed by NP .The patient is known to me .   Calia Napp, MD  

## 2017-10-14 ENCOUNTER — Encounter: Payer: Self-pay | Admitting: Family Medicine

## 2017-10-14 ENCOUNTER — Ambulatory Visit: Payer: 59 | Admitting: Family Medicine

## 2017-10-14 VITALS — BP 108/70 | HR 77 | Temp 97.9°F | Ht 67.0 in | Wt 246.2 lb

## 2017-10-14 DIAGNOSIS — J0100 Acute maxillary sinusitis, unspecified: Secondary | ICD-10-CM

## 2017-10-14 MED ORDER — AMOXICILLIN-POT CLAVULANATE 875-125 MG PO TABS
1.0000 | ORAL_TABLET | Freq: Two times a day (BID) | ORAL | 0 refills | Status: DC
Start: 2017-10-14 — End: 2017-12-17

## 2017-10-14 MED FILL — AMOX-CLAV 875-125 MG TABLET: 875-125 | 10 days supply | Qty: 20 | Fill #0

## 2017-10-14 NOTE — Patient Instructions (Addendum)
Continue to push fluids, practice good hand hygiene, and cover your mouth if you cough.  If you start having fevers, shaking or shortness of breath, seek immediate care.  OK to take Tylenol 1000 mg (2 extra strength tabs) or 975 mg (3 regular strength tabs) every 6 hours as needed.  Ibuprofen 400-600 mg (2-3 over the counter strength tabs) every 6 hours as needed for pain.  Let us know if you need anything.

## 2017-10-14 NOTE — Progress Notes (Signed)
Pre visit review using our clinic review tool, if applicable. No additional management support is needed unless otherwise documented below in the visit note. 

## 2017-10-14 NOTE — Progress Notes (Signed)
Chief Complaint  Patient presents with  . Sinusitis    Lindsay Wade here for URI complaints.  Duration: 2 weeks  Associated symptoms: Fever (100.4 F), sinus congestion, sinus pain and rhinorrhea Denies: itchy watery eyes, ear fullness, ear pain, ear drainage, sore throat, wheezing, shortness of breath, myalgia and cough Treatment to date: Pseudoephedrine Sick contacts: No  ROS:  Const: Denies fevers HEENT: As noted in HPI Lungs: No SOB  Past Medical History:  Diagnosis Date  . Essential hypertension 04/15/2017  . Headache   . Hypothyroid   . Hypothyroidism   . MRSA infection    Family History  Problem Relation Age of Onset  . Hypertension Mother   . Hypertension Father   . Parkinsonism Father   . Hyperlipidemia Sister   . Thyroid disease Neg Hx     BP 108/70 (BP Location: Left Arm, Patient Position: Sitting, Cuff Size: Large)   Pulse 77   Temp 97.9 F (36.6 C) (Oral)   Ht 5\' 7"  (1.702 m)   Wt 246 lb 4 oz (111.7 kg)   SpO2 97%   BMI 38.57 kg/m  General: Awake, alert, appears stated age HEENT: AT, [place], ears patent b/l and TM's neg, nares patent w/o discharge,+ttp over max sinuses b/l, pharynx pink and without exudates, MMM Neck: No masses or asymmetry Heart: RRR Lungs: CTAB, no accessory muscle use Psych: Age appropriate judgment and insight, normal mood and affect  Acute non-recurrent maxillary sinusitis - Plan: amoxicillin-clavulanate (AUGMENTIN) 875-125 MG tablet  Will tx given duration and continuation of s/s's.  Ibuprofen/Tylenol for pain, push fluids.  F/u prn. If starting to experience fevers, shaking, or shortness of breath, seek immediate care. Pt voiced understanding and agreement to the plan.  [place], DO 10/14/17 2:32 PM

## 2017-10-25 DIAGNOSIS — G4733 Obstructive sleep apnea (adult) (pediatric): Secondary | ICD-10-CM | POA: Diagnosis not present

## 2017-11-10 ENCOUNTER — Other Ambulatory Visit: Payer: 59

## 2017-11-10 VITALS — Temp 98.8°F

## 2017-11-10 DIAGNOSIS — R309 Painful micturition, unspecified: Secondary | ICD-10-CM

## 2017-11-10 MED ORDER — PHENAZOPYRIDINE HCL 200 MG PO TABS: 200.0000 mg | ORAL_TABLET | Freq: Three times a day (TID) | ORAL | 0 refills | Status: DC | PRN

## 2017-11-10 MED FILL — PHENAZOPYRIDINE 200 MG TAB: 200 | 3 days supply | Qty: 10 | Fill #0

## 2017-11-10 NOTE — Progress Notes (Signed)
Pt here for lab only with c/o's painful urination, urgency and feeling of pressure even after she voids.  Urine dip does show small leuks .  Pt stated that she did take 1 Bactrim last night and has helped with her symptoms.  (Med was not prescribed for her)  Urine culture sent and a RX for Pyridium sent to [facility] outpatient pharmacy.  Will notify pt with results.

## 2017-11-12 MED FILL — LISINOPRIL 20 MG TABLET: 20 | 30 days supply | Qty: 30 | Fill #2

## 2017-11-12 MED FILL — AMLODIPINE BESYLATE 5 MG TA: 5 | 30 days supply | Qty: 30 | Fill #2

## 2017-11-13 ENCOUNTER — Telehealth: Payer: Self-pay | Admitting: *Deleted

## 2017-11-13 LAB — URINE CULTURE, OB REFLEX: Organism ID, Bacteria: NO GROWTH

## 2017-11-13 LAB — CULTURE, OB URINE

## 2017-11-13 MED ORDER — SULFAMETHOXAZOLE-TRIMETHOPRIM 800-160 MG PO TABS: 1.0000 | ORAL_TABLET | Freq: Two times a day (BID) | ORAL | 0 refills | Status: DC

## 2017-11-13 MED FILL — SULFAMETHOXAZOLE-TMP DS TAB: 800-160 | 5 days supply | Qty: 10 | Fill #0

## 2017-11-13 NOTE — Telephone Encounter (Signed)
Pt called about her urine culture which is still pending.  She states that the discomfort is getting worse and does not want to go thru the weekend without something.  Per protocol she was given Bactrim DS which was sent to [facility].  Will notify her when culture results are available.

## 2017-11-18 DIAGNOSIS — G4733 Obstructive sleep apnea (adult) (pediatric): Secondary | ICD-10-CM | POA: Diagnosis not present

## 2017-12-01 MED FILL — DEXILANT DR 60 MG CAPSULE: 60 | 30 days supply | Qty: 30 | Fill #0 | Status: TO

## 2017-12-09 ENCOUNTER — Telehealth: Payer: Self-pay | Admitting: Family Medicine

## 2017-12-09 ENCOUNTER — Encounter: Payer: Self-pay | Admitting: Family Medicine

## 2017-12-09 NOTE — Telephone Encounter (Signed)
Copied from [place] [id number]. Topic: Appointment Scheduling - Scheduling Inquiry for Clinic >> Dec 08, 2017 11:35 AM Rutherford Nail, NT wrote: Reason for CRM: Patient is requesting to get her CPE by 01/04/18. She has one scheduled for 12/11/17, but is not feeling well. Wanted to reschedule but there was not an appointment available until after 01/04/18. Patient would like to be fit in if possible, if not she will keep her 12/11/17 appointment. Please advise and let patient know. CB#: [phone] >> Dec 08, 2017 12:06 PM Vernona Rieger wrote: Patient called and said she needs to move her physical for Friday 6/7 due to her son having awards at school. Called and spoke with Rod Holler at the office and she said that Dr [name] would need to approve to work her in for the physical. She needs it by July 1st due to her insurance with [facility]. Please call her at @ [phone]

## 2017-12-09 NOTE — Telephone Encounter (Signed)
That's fine. Find a spot for her please. TY.

## 2017-12-09 NOTE — Telephone Encounter (Signed)
Patient already has appointment for 12-11-2017 @ 2pm.

## 2017-12-10 NOTE — Telephone Encounter (Addendum)
Relation to pt: self  Call back number:[phone]   Reason for call:  Patient checking on the status of message below, regarding getting worked in for a physical appointment prior to July. Patient states she's unable to keep 12/11/17 appointment therefore appointment has been cancelled so patient will not receive a  no show fee.   In addition patient would like a print out of her immunization records with office/MD stamp and left at the front desk for her spouse Raby,Joseph to pick up today, please advise

## 2017-12-10 NOTE — Telephone Encounter (Signed)
Lindsay Wade can you take care of this

## 2017-12-11 ENCOUNTER — Encounter: Payer: 59 | Admitting: Family Medicine

## 2017-12-15 NOTE — Telephone Encounter (Signed)
Called patient and resech for patient to come 12-17-2017 @7am 

## 2017-12-17 ENCOUNTER — Encounter: Payer: Self-pay | Admitting: Family Medicine

## 2017-12-17 ENCOUNTER — Ambulatory Visit (INDEPENDENT_AMBULATORY_CARE_PROVIDER_SITE_OTHER): Payer: 59 | Admitting: Family Medicine

## 2017-12-17 VITALS — BP 128/84 | HR 66 | Temp 98.6°F | Ht 66.0 in | Wt 255.4 lb

## 2017-12-17 DIAGNOSIS — E038 Other specified hypothyroidism: Secondary | ICD-10-CM | POA: Diagnosis not present

## 2017-12-17 DIAGNOSIS — Z114 Encounter for screening for human immunodeficiency virus [HIV]: Secondary | ICD-10-CM | POA: Diagnosis not present

## 2017-12-17 DIAGNOSIS — E063 Autoimmune thyroiditis: Secondary | ICD-10-CM | POA: Diagnosis not present

## 2017-12-17 DIAGNOSIS — Z0000 Encounter for general adult medical examination without abnormal findings: Secondary | ICD-10-CM | POA: Diagnosis not present

## 2017-12-17 LAB — LIPID PANEL
Cholesterol: 246 mg/dL — ABNORMAL HIGH (ref 0–200)
HDL: 59.9 mg/dL (ref >39.00)
LDL Cholesterol: 149 mg/dL — ABNORMAL HIGH (ref 0–99)
NonHDL: 186.06
Total CHOL/HDL Ratio: 4
Triglycerides: 184 mg/dL — ABNORMAL HIGH (ref 0.0–149.0)
VLDL: 36.8 mg/dL (ref 0.0–40.0)

## 2017-12-17 LAB — CBC
HCT: 37.9 % (ref 36.0–46.0)
Hemoglobin: 12.6 g/dL (ref 12.0–15.0)
MCHC: 33.3 g/dL (ref 30.0–36.0)
MCV: 89.2 fl (ref 78.0–100.0)
Platelets: 265 10*3/uL (ref 150.0–400.0)
RBC: 4.24 Mil/uL (ref 3.87–5.11)
RDW: 13.9 % (ref 11.5–15.5)
WBC: 6.1 10*3/uL (ref 4.0–10.5)

## 2017-12-17 LAB — COMPREHENSIVE METABOLIC PANEL
ALT: 28 U/L (ref 0–35)
AST: 24 U/L (ref 0–37)
Albumin: 4.3 g/dL (ref 3.5–5.2)
Alkaline Phosphatase: 73 U/L (ref 39–117)
BUN: 17 mg/dL (ref 6–23)
CO2: 26 mEq/L (ref 19–32)
Calcium: 9.2 mg/dL (ref 8.4–10.5)
Chloride: 104 mEq/L (ref 96–112)
Creatinine, Ser: 0.82 mg/dL (ref 0.40–1.20)
GFR: 78.56 mL/min (ref >60.00)
Glucose, Bld: 96 mg/dL (ref 70–99)
Potassium: 4.4 mEq/L (ref 3.5–5.1)
Sodium: 140 mEq/L (ref 135–145)
Total Bilirubin: 0.4 mg/dL (ref 0.2–1.2)
Total Protein: 6.7 g/dL (ref 6.0–8.3)

## 2017-12-17 LAB — TSH: TSH: 5.35 u[IU]/mL — ABNORMAL HIGH (ref 0.35–4.50)

## 2017-12-17 LAB — T4, FREE: Free T4: 0.79 ng/dL (ref 0.60–1.60)

## 2017-12-17 NOTE — Progress Notes (Signed)
Chief Complaint  Patient presents with  . Annual Exam     Well Woman Lindsay Wade is here for a complete physical.   Her last physical was >1 year ago.   Current diet: in general, a "healthy" diet. Goes to [employer] Current exercise: walking. Weight is increasing a bit; denies daytime fatigue. On BPAP now and feels much. No LMP recorded. Patient has had an ablation. LMP 2004.  Seatbelt? Yes  Health Maintenance Pap/HPV- Yes Mammogram- Yes Tetanus- Yes HIV screening- No   Past Medical History:  Diagnosis Date  . Essential hypertension 04/15/2017  . Headache   . Hypothyroid   . Hypothyroidism   . MRSA infection      Past Surgical History:  Procedure Laterality Date  . BTS    . CESAREAN SECTION    . TONSILLECTOMY    . uterine ablation      Medications  Current Outpatient Medications on File Prior to Visit  Medication Sig Dispense Refill  . amLODipine (NORVASC) 5 MG tablet Take 1 tablet (5 mg total) by mouth daily. 30 tablet 2  . levothyroxine (SYNTHROID, LEVOTHROID) 150 MCG tablet Take 1 tablet (150 mcg total) by mouth daily before breakfast. 90 tablet 4  . lisinopril (PRINIVIL,ZESTRIL) 20 MG tablet Take 1 tablet (20 mg total) by mouth daily. 30 tablet 2  . SIMETHICONE EXTRA STRENGTH PO Take by mouth. Takes BID prn     Allergies No Known Allergies  Review of Systems: Constitutional: No fevers Eye:  no recent significant change in vision Ear/Nose/Mouth/Throat:  Ears:  no tinnitus or vertigo and no recent change in hearing Nose/Mouth/Throat:  no complaints of nasal congestion, no sore throat Cardiovascular: no chest pain Respiratory:  no cough and no shortness of breath Gastrointestinal:  no abdominal pain, no change in bowel habits GU:  Female: negative for dysuria or pelvic pain Musculoskeletal/Extremities:  no pain of the joints Integumentary (Skin/Breast):  no abnormal skin lesions reported Neurologic:  no headaches Endocrine:  denies  fatigue Hematologic/Lymphatic:  No areas of easy bleeding  Exam BP 128/84 (BP Location: Left Arm, Patient Position: Sitting, Cuff Size: Large)   Pulse 66   Temp 98.6 F (37 C) (Oral)   Ht 5\' 6"  (1.676 m)   Wt 255 lb 6 oz (115.8 kg)   SpO2 99%   BMI 41.22 kg/m  General:  well developed, well nourished, in no apparent distress Skin:  no significant moles, warts, or growths Head:  no masses, lesions, or tenderness Eyes:  pupils equal and round, sclera anicteric without injection Ears:  canals without lesions, TMs shiny without retraction, no obvious effusion, no erythema Nose:  nares patent, septum midline, mucosa normal, and no drainage or sinus tenderness Throat/Pharynx:  lips and gingiva without lesion; tongue and uvula midline; non-inflamed pharynx; no exudates or postnasal drainage Neck: neck supple without adenopathy, thyromegaly, or masses Lungs:  clear to auscultation, breath sounds equal bilaterally, no respiratory distress Cardio:  regular rate and rhythm, no bruits, no LE edema Abdomen:  abdomen soft, nontender; bowel sounds normal; no masses or organomegaly Genital: Defer to GYN Musculoskeletal:  symmetrical muscle groups noted without atrophy or deformity Extremities:  no clubbing, cyanosis, or edema, no deformities, no skin discoloration Neuro:  gait normal; deep tendon reflexes normal and symmetric Psych: well oriented with normal range of affect and appropriate judgment/insight  Assessment and Plan  Well adult exam - Plan: Comprehensive metabolic panel, CBC, Lipid panel  Hypothyroidism due to Hashimoto's thyroiditis - Plan: TSH, T4,  free  Screening for HIV (human immunodeficiency virus) - Plan: HIV antibody   Well 51 y.o. female. Counseled on diet and exercise. Other orders as above. Will consider adjusting dose/regimen for thyroid replacement depending on levels.  Follow up in 6 mo for med check. The patient voiced understanding and agreement to the  plan.  [place], DO 12/17/17 7:31 AM

## 2017-12-17 NOTE — Progress Notes (Signed)
Pre visit review using our clinic review tool, if applicable. No additional management support is needed unless otherwise documented below in the visit note. 

## 2017-12-17 NOTE — Patient Instructions (Addendum)
Keep up the good work.  Try to add something for the upper body when you exercise. Consider yoga for the upper body as well.  Keep the diet clean.  1-2 business days to get the results of your labs back.  Give the foot/toe issue a little more time. If it gets worse or fails to get better, let me know and I will place a referral to the podiatry team.  Let us know if you need anything.

## 2017-12-18 ENCOUNTER — Other Ambulatory Visit: Payer: Self-pay | Admitting: Family Medicine

## 2017-12-18 DIAGNOSIS — E038 Other specified hypothyroidism: Secondary | ICD-10-CM

## 2017-12-18 LAB — HIV ANTIBODY (ROUTINE TESTING W REFLEX): HIV 1&2 Ab, 4th Generation: NONREACTIVE

## 2017-12-18 MED ORDER — LEVOTHYROXINE SODIUM 175 MCG PO TABS: ORAL_TABLET | ORAL | 1 refills | Status: DC

## 2017-12-18 MED FILL — SYNTHROID 175 MCG TABLET: 175 | 90 days supply | Qty: 45 | Fill #0

## 2017-12-22 ENCOUNTER — Encounter: Payer: Self-pay | Admitting: Family Medicine

## 2017-12-22 DIAGNOSIS — I10 Essential (primary) hypertension: Secondary | ICD-10-CM

## 2017-12-23 MED ORDER — AMLODIPINE BESYLATE 5 MG PO TABS: 5.0000 mg | ORAL_TABLET | Freq: Every day | ORAL | 6 refills | Status: DC

## 2017-12-23 MED ORDER — LISINOPRIL 20 MG PO TABS: 20.0000 mg | ORAL_TABLET | Freq: Every day | ORAL | 6 refills | Status: DC

## 2018-01-01 MED FILL — LISINOPRIL 20 MG TABLET: 20 | 90 days supply | Qty: 90 | Fill #0

## 2018-01-01 MED FILL — AMLODIPINE BESYLATE 5 MG TA: 5 | 90 days supply | Qty: 90 | Fill #0

## 2018-01-28 ENCOUNTER — Encounter: Payer: Self-pay | Admitting: Family Medicine

## 2018-01-29 ENCOUNTER — Other Ambulatory Visit: Payer: 59

## 2018-01-29 ENCOUNTER — Other Ambulatory Visit: Payer: Self-pay | Admitting: Family Medicine

## 2018-01-29 DIAGNOSIS — R0681 Apnea, not elsewhere classified: Secondary | ICD-10-CM

## 2018-02-01 MED FILL — DEXILANT DR 60 MG CAPSULE: 60 | 30 days supply | Qty: 30 | Fill #0

## 2018-03-02 DIAGNOSIS — G4733 Obstructive sleep apnea (adult) (pediatric): Secondary | ICD-10-CM | POA: Diagnosis not present

## 2018-03-23 DIAGNOSIS — H524 Presbyopia: Secondary | ICD-10-CM | POA: Diagnosis not present

## 2018-03-31 ENCOUNTER — Other Ambulatory Visit: Payer: 59

## 2018-04-02 ENCOUNTER — Other Ambulatory Visit: Payer: Self-pay | Admitting: Endocrinology

## 2018-04-02 MED FILL — SYNTHROID 175 MCG TABLET: 175 | 90 days supply | Qty: 45 | Fill #1

## 2018-04-02 NOTE — Telephone Encounter (Signed)
NW-Plz see refill rew/thx dmf

## 2018-04-02 NOTE — Telephone Encounter (Signed)
Chart shows that her PCP is following her thyroid tests and medication, do you still want to fill this. Last filled by PCP and seen you last in 04/2017 no upcoming appointment

## 2018-04-02 NOTE — Telephone Encounter (Signed)
Please refer request to PCP 

## 2018-04-05 ENCOUNTER — Ambulatory Visit (INDEPENDENT_AMBULATORY_CARE_PROVIDER_SITE_OTHER): Payer: 59 | Admitting: Internal Medicine

## 2018-04-05 ENCOUNTER — Encounter: Payer: Self-pay | Admitting: Internal Medicine

## 2018-04-05 VITALS — BP 126/74 | HR 74 | Temp 98.2°F | Resp 16 | Ht 66.0 in | Wt 245.1 lb

## 2018-04-05 DIAGNOSIS — J069 Acute upper respiratory infection, unspecified: Secondary | ICD-10-CM | POA: Diagnosis not present

## 2018-04-05 DIAGNOSIS — J40 Bronchitis, not specified as acute or chronic: Secondary | ICD-10-CM | POA: Diagnosis not present

## 2018-04-05 MED ORDER — AZELASTINE HCL 0.1 % NA SOLN: 2.0000 | Freq: Every evening | NASAL | 3 refills | Status: DC | PRN

## 2018-04-05 MED ORDER — AZITHROMYCIN 250 MG PO TABS: ORAL_TABLET | ORAL | 0 refills | Status: DC

## 2018-04-05 MED FILL — AZITHROMYCIN 250 MG TABLET: 250 | 5 days supply | Qty: 6 | Fill #0

## 2018-04-05 MED FILL — AZELASTINE HCL 137 MCG/SPRA: 137 | 50 days supply | Qty: 30 | Fill #0

## 2018-04-05 NOTE — Progress Notes (Signed)
Subjective:    Patient ID: Lindsay Wade, female    DOB: 1965-07-16, 52 y.o.   MRN: [id number]  DOS:  04/05/2018 Type of visit - description : acute Interval history:  Symptoms started 2 weeks ago with sore throat, nasal discharge, cough with sputum production. Both the sputum and the nasal discharge are greenish in color. She is very congested in the nose, has been unable to use her BiPAP recently. Several days last week she had fever but not this week. She is taking Mucinex, Advil, Sudafed and is still symptomatic.  Review of Systems  No nausea, vomiting, diarrhea. No history of allergies Some hoarseness noted.  Past Medical History:  Diagnosis Date  . Essential hypertension 04/15/2017  . Headache   . Hypothyroid   . Hypothyroidism   . MRSA infection     Past Surgical History:  Procedure Laterality Date  . BTS    . CESAREAN SECTION    . TONSILLECTOMY    . uterine ablation      Social History   Socioeconomic History  . Marital status: Married    Spouse name: Not on file  . Number of children: 2  . Years of education: 4  . Highest education level: High school graduate  Occupational History  . Occupation: stay at home mom   Social Needs  . Financial resource strain: Not on file  . Food insecurity:    Worry: Not on file    Inability: Not on file  . Transportation needs:    Medical: Not on file    Non-medical: Not on file  Tobacco Use  . Smoking status: Never Smoker  . Smokeless tobacco: Never Used  Substance and Sexual Activity  . Alcohol use: Yes    Comment: social drinker; very rare   . Drug use: No  . Sexual activity: Yes    Partners: Male    Birth control/protection: Surgical  Lifestyle  . Physical activity:    Days per week: Not on file    Minutes per session: Not on file  . Stress: Not on file  Relationships  . Social connections:    Talks on phone: Not on file    Gets together: Not on file    Attends religious service: Not on file      Active member of club or organization: Not on file    Attends meetings of clubs or organizations: Not on file    Relationship status: Not on file  . Intimate partner violence:    Fear of current or ex partner: Not on file    Emotionally abused: Not on file    Physically abused: Not on file    Forced sexual activity: Not on file  Other Topics Concern  . Not on file  Social History Narrative   Lives at home with her husband and 2 sons   Right handed   3-4 cups of caffeine daily      Allergies as of 04/05/2018   No Known Allergies     Medication List        Accurate as of 04/05/18 11:59 PM. Always use your most recent med list.          amLODipine 5 MG tablet Commonly known as:  NORVASC Take 1 tablet (5 mg total) by mouth daily.   azelastine 0.1 % nasal spray Commonly known as:  ASTELIN Place 2 sprays into both nostrils at bedtime as needed for rhinitis. Use in each nostril as directed  azithromycin 250 MG tablet Commonly known as:  ZITHROMAX 2 tabs a day the first day, then 1 tab a day x 4 days   levothyroxine 175 MCG tablet Commonly known as:  SYNTHROID, LEVOTHROID Take 1 tab by mouth every other day.   levothyroxine 150 MCG tablet Commonly known as:  SYNTHROID, LEVOTHROID TAKE 1 TABLET BY MOUTH ONCE DAILY BEFORE BREAKFAST   lisinopril 20 MG tablet Commonly known as:  PRINIVIL,ZESTRIL Take 1 tablet (20 mg total) by mouth daily.   SIMETHICONE EXTRA STRENGTH PO Take by mouth. Takes BID prn          Objective:   Physical Exam BP 126/74 (BP Location: Left Arm, Patient Position: Sitting, Cuff Size: Normal)   Pulse 74   Temp 98.2 F (36.8 C) (Oral)   Resp 16   Ht 5\' 6"  (1.676 m)   Wt 245 lb 2 oz (111.2 kg)   SpO2 96%   BMI 39.56 kg/m  General:   Well developed, NAD, see BMI.  HEENT:  Normocephalic . Face symmetric, atraumatic. TMs normal.  Throat mildly red, no discharge, tonsils surgically absent. Sinuses: Very TTP throughout particularly the  frontal sinuses Lungs:  CTA B Normal respiratory effort, no intercostal retractions, no accessory muscle use. Heart: RRR,  no murmur.  No pretibial edema bilaterally  Skin: Not pale. Not jaundice Neurologic:  alert & oriented X3.  Speech normal, gait appropriate for age and unassisted Psych--  Cognition and judgment appear intact.  Cooperative with normal attention span and concentration.  Behavior appropriate. No anxious or depressed appearing.      Assessment & Plan:   52 y/o PMH includes HTN, thyroid, OSA presents with:  URI, mild bronchitis/sinusitis. Symptoms going on for 2 weeks, she is a still very congested at the sinuses, recommend a Z-Pak and continue supportive treatment with Mucinex DM, Flonase.  Add Astelin.  Call if not improving gradually.

## 2018-04-05 NOTE — Progress Notes (Signed)
Pre visit review using our clinic review tool, if applicable. No additional management support is needed unless otherwise documented below in the visit note. 

## 2018-04-05 NOTE — Patient Instructions (Signed)
Rest, fluids , tylenol  For cough:  Take Mucinex DM twice a day as needed until better  For nasal congestion: Use   Flonase : 2 nasal sprays on each side of the nose in the morning until you feel better Use ASTELIN a prescribed spray : 2 nasal sprays on each side of the nose at night until you feel better   Take the antibiotic as prescribed  (zithromax )  Call if not gradually better over the next  10 days  Call anytime if the symptoms are severe

## 2018-04-06 NOTE — Progress Notes (Signed)
[name]  PATIENT: Lindsay Wade DOB: 11/08/1965   REASON FOR VISIT: Follow-up for severe obstructive sleep apnea with initial BiPAP compliance  HISTORY FROM: Patient    HISTORY OF PRESENT ILLNESS:UPDATE 10/2/2019CM Ms.64, 52 year old female returns for follow-up with history of severe obstructive sleep apnea on BiPAP.  Compliance data dated 03/07/2018-04/05/2018 shows compliance greater than 4 hours at 83%.  Average usage 6 hours 25 minutes set pressure 12 to 16 cm leak 95th percentile 3.1.  AHI 5.7.  ESS 1 patient did not use her Bipap for 4 days due to bronchitis.  She is still taking the antibiotic she returns for reevaluation  UPDATE 4/2/2019CM  Ms.40, 52 year old female returns for follow-up for initial BiPAP compliance after being diagnosed with severe obstructive sleep apnea.  She says I do not like using the machine however it makes me feel much better.  BiPAP compliance dated 09/05/2017 -10/04/2017 shows compliance greater than 4 hours at 100%.  Average usage 7 hours 26 minutes.  Set pressure of 12-16 cm AHI 2.4 ESS 1 fatigue severity score 15.  She returns for reevaluation 05/26/17 CDMichele Wade is a married, [name] , seen here as  a referral  from Dr. Jaynee Wade for a sleep consultation.  Chief complaint according to patient : " I have bad, bad headaches in the morning and high blood pressure. "  Mrs. Lindsay Wade presents today as a new patient to the sleep clinic but an established headache patient of Dr. Jaynee Wade.  She reports that over the last 3 months she had escalating headaches which bothered her mostly in the morning.  For a while it was thought that if her blood pressure is better controlled her headaches would go away but this was not the case.  Dr. Jaynee Wade obtained an MRI of the brain and also cervical spine images which returned normal.  The patient had mentioned that her mother suffered  from meningioma.  The Patient also describes that she feels hot as if having a fever or viral infection, she feels weak and tired and would like to rest with her headaches.  The headaches are present in the morning but they do not wake her from sleep.  Sometimes a Wade more tension-like other times pounding and pulsatile, she is not bothered by movement light or loud sounds.  She is not squeezy.  There has been no dizziness, no shortness of breath and no chest pain. Her husband has told her that she is a family with snoring and 1 of our avenues to evaluate her headache will be to rule out sleep apnea.  Mrs. Lindsay Wade informed me that she had previously been diagnosed with sleep apnea in August 2014 in [place], [place].  [facility] for sleep studies.  I have only a copy for a CPAP titration which does not "the results of the baseline sleep study.  "It was a baseline RDI and not AHI.  13.2/h with an Epworth sleepiness score at the time endorsed at 13 points.  CPAP titration explored pressures beginning at 7 cm and changed finally to BiPAP at 10/6 centimeter water.  The patient was deemed intolerant despite use of various mask styles, types and sizes.  The patient requested to end the study prematurely and left the lab.  Sleep habits are as follows: Her husband has moved on to another bedroom as he is bothered by her snoring.  The usual bedtime for the patient is 10:30  PM and she does not have trouble initiating sleep.  She had a fullness feeling in her throat, felt that her airway was partially blocked and began sleeping more reclined rather than flat.  This has helped.  She sleeps on her bed- but now in supine on one pillow.  She describes her bedroom is cool, quiet and dark - conducive to sleep. She gets up twice to go to the bathroom, around 4 and 6 AM. She rises at 6.30 AM spontaneously, on weekends until 8 AM. Daytime naps are unscheduled- and she falls asleep after lunch.   REVIEW  OF SYSTEMS: Full 14 system review of systems performed and notable only for those listed, all others are neg:  Constitutional: neg  Cardiovascular: neg Ear/Nose/Throat: neg  Skin: neg Eyes: neg Respiratory: Upper respiratory infection Gastroitestinal: neg  Hematology/Lymphatic: neg  Endocrine: neg Musculoskeletal:neg Allergy/Immunology: neg Neurological: neg Psychiatric: neg Sleep : Severe obstructive sleep apnea with BiPAP   ALLERGIES: No Known Allergies  HOME MEDICATIONS: Outpatient Medications Prior to Visit  Medication Sig Dispense Refill  . amLODipine (NORVASC) 5 MG tablet Take 1 tablet (5 mg total) by mouth daily. 30 tablet 6  . azelastine (ASTELIN) 0.1 % nasal spray Place 2 sprays into both nostrils at bedtime as needed for rhinitis. Use in each nostril as directed 30 mL 3  . azithromycin (ZITHROMAX Z-PAK) 250 MG tablet 2 tabs a day the first day, then 1 tab a day x 4 days 6 tablet 0  . levothyroxine (SYNTHROID) 150 MCG tablet TAKE 1 TABLET BY MOUTH ONCE DAILY BEFORE BREAKFAST 90 tablet 4  . levothyroxine (SYNTHROID, LEVOTHROID) 175 MCG tablet Take 1 tab by mouth every other day. 90 tablet 1  . lisinopril (PRINIVIL,ZESTRIL) 20 MG tablet Take 1 tablet (20 mg total) by mouth daily. 30 tablet 6  . SIMETHICONE EXTRA STRENGTH PO Take by mouth. Takes BID prn     No facility-administered medications prior to visit.     PAST MEDICAL HISTORY: Past Medical History:  Diagnosis Date  . Essential hypertension 04/15/2017  . Headache   . Hypothyroid   . Hypothyroidism   . MRSA infection     PAST SURGICAL HISTORY: Past Surgical History:  Procedure Laterality Date  . BTS    . CESAREAN SECTION    . TONSILLECTOMY    . uterine ablation      FAMILY HISTORY: Family History  Problem Relation Age of Onset  . Hypertension Mother   . Hypertension Father   . Parkinsonism Father   . Hyperlipidemia Sister   . Thyroid disease Neg Hx     SOCIAL HISTORY: Social History    Socioeconomic History  . Marital status: Married    Spouse name: Not on file  . Number of children: 2  . Years of education: 37  . Highest education level: High school graduate  Occupational History  . Occupation: stay at home mom   Social Needs  . Financial resource strain: Not on file  . Food insecurity:    Worry: Not on file    Inability: Not on file  . Transportation needs:    Medical: Not on file    Non-medical: Not on file  Tobacco Use  . Smoking status: Never Smoker  . Smokeless tobacco: Never Used  Substance and Sexual Activity  . Alcohol use: Yes    Comment: social drinker; very rare   . Drug use: No  . Sexual activity: Yes    Partners: Male    Birth  control/protection: Surgical  Lifestyle  . Physical activity:    Days per week: Not on file    Minutes per session: Not on file  . Stress: Not on file  Relationships  . Social connections:    Talks on phone: Not on file    Gets together: Not on file    Attends religious service: Not on file    Active member of club or organization: Not on file    Attends meetings of clubs or organizations: Not on file    Relationship status: Not on file  . Intimate partner violence:    Fear of current or ex partner: Not on file    Emotionally abused: Not on file    Physically abused: Not on file    Forced sexual activity: Not on file  Other Topics Concern  . Not on file  Social History Narrative   Lives at home with her husband and 2 sons   Right handed   3-4 cups of caffeine daily     PHYSICAL EXAM  Vitals:   04/07/18 1515  BP: 130/87  Pulse: 67  Weight: 246 lb (111.6 kg)  Height: 5\' 6"  (1.676 m)   Body mass index is 39.71 kg/m.  Generalized: Well developed, morbidly obese female in no acute distress  Head: normocephalic and atraumatic,. Oropharynx benign mallampati 4 Neck: Supple, circumference 17 Lungs clear Skin no rash or edema Musculoskeletal: No deformity   Neurological examination   Mentation:  Alert oriented to time, place, history taking. Attention span and concentration appropriate. Recent and remote memory intact.  Follows all commands speech and language fluent. ESS 1  Cranial nerve II-XII: .Pupils were equal round reactive to light extraocular movements were full, visual field were full on confrontational test. Facial sensation and strength were normal. hearing was intact to finger rubbing bilaterally. Uvula tongue midline. head turning and shoulder shrug were normal and symmetric.Tongue protrusion into cheek strength was normal. Motor: normal bulk and tone, full strength in the BUE, BLE, fine finger movements normal, no pronator drift. No focal weakness Sensory: normal and symmetric to light touch,  Coordination: finger-nose-finger, heel-to-shin bilaterally, no dysmetria Gait and Station: Rising up from seated position without assistance, normal stance,  moderate stride, good arm swing, smooth turning, able to perform tiptoe, and heel walking without difficulty. Tandem gait is steady  DIAGNOSTIC DATA (LABS, IMAGING, TESTING) - I reviewed patient records, labs, notes, testing and imaging myself where available.      Component Value Date/Time   NA 140 12/17/2017 0737   K 4.4 12/17/2017 0737   CL 104 12/17/2017 0737   CO2 26 12/17/2017 0737   GLUCOSE 96 12/17/2017 0737   BUN 17 12/17/2017 0737   CREATININE 0.82 12/17/2017 0737   CALCIUM 9.2 12/17/2017 0737   PROT 6.7 12/17/2017 0737   ALBUMIN 4.3 12/17/2017 0737   AST 24 12/17/2017 0737   ALT 28 12/17/2017 0737   ALKPHOS 73 12/17/2017 0737   BILITOT 0.4 12/17/2017 0737    Lab Results  Component Value Date   TSH 5.35 (H) 12/17/2017      ASSESSMENT AND PLAN  52 y.o. year old female  has a past medical history of Essential hypertension (04/15/2017), Headache, Hypothyroid, Hypothyroidism, and severe obstructive sleep apnea here forBIPAP compliance.Compliance data dated 03/07/2018-04/05/2018 shows compliance greater than  4 hours at 83%.  Average usage 6 hours 25 minutes set pressure 12 to 16 cm leak 95th percentile 3.1.  AHI 5.7.  ESS 1 patient did not  use her Bipap for 4 days due to bronchitis.  She is still taking the antibiotic    PLAN: BiPAP compliance greater than 4 hours 83% had bronchitis and could not use for 4 days due to bronchitis Continue same settings Slow steady weight loss Follow-up in 6 months for repeat compliance Dennie Bible, [facility], [facility], APRN  [facility] [address] [place], [place] [address] [phone]

## 2018-04-07 ENCOUNTER — Encounter: Payer: Self-pay | Admitting: Nurse Practitioner

## 2018-04-07 ENCOUNTER — Ambulatory Visit: Payer: 59 | Admitting: Nurse Practitioner

## 2018-04-07 VITALS — BP 130/87 | HR 67 | Ht 66.0 in | Wt 246.0 lb

## 2018-04-07 DIAGNOSIS — G4733 Obstructive sleep apnea (adult) (pediatric): Secondary | ICD-10-CM

## 2018-04-07 NOTE — Patient Instructions (Signed)
BiPAP compliance greater than 4 hours 83% had bronchitis and could not use for 4 days due to bronchitis Continue same settings Slow steady weight loss Follow-up in 6 months for repeat compliance

## 2018-04-12 MED FILL — DEXILANT DR 60 MG CAPSULE: 60 | 30 days supply | Qty: 30 | Fill #1

## 2018-04-19 ENCOUNTER — Other Ambulatory Visit: Payer: 59

## 2018-04-28 MED FILL — LISINOPRIL 20 MG TABLET: 20 | 90 days supply | Qty: 90 | Fill #1

## 2018-04-30 ENCOUNTER — Other Ambulatory Visit: Payer: 59

## 2018-04-30 ENCOUNTER — Ambulatory Visit: Payer: 59

## 2018-06-09 DIAGNOSIS — G4733 Obstructive sleep apnea (adult) (pediatric): Secondary | ICD-10-CM | POA: Diagnosis not present

## 2018-06-14 MED FILL — DEXILANT DR 60 MG CAPSULE: 60 | 30 days supply | Qty: 30 | Fill #2

## 2018-06-23 ENCOUNTER — Encounter: Payer: Self-pay | Admitting: Nurse Practitioner

## 2018-07-22 MED FILL — LISINOPRIL 20 MG TABLET: 20 | 30 days supply | Qty: 30 | Fill #2

## 2018-07-22 MED FILL — SYNTHROID 150 MCG TABLET: 150 | 90 days supply | Qty: 90 | Fill #1

## 2018-08-19 ENCOUNTER — Ambulatory Visit: Payer: 59 | Admitting: Family Medicine

## 2018-08-19 ENCOUNTER — Encounter: Payer: Self-pay | Admitting: Family Medicine

## 2018-08-19 VITALS — BP 120/72 | HR 90 | Temp 98.5°F | Ht 66.0 in | Wt 237.4 lb

## 2018-08-19 DIAGNOSIS — N3001 Acute cystitis with hematuria: Secondary | ICD-10-CM

## 2018-08-19 LAB — POC URINALSYSI DIPSTICK (AUTOMATED)
Bilirubin, UA: NEGATIVE
Blood, UA: POSITIVE
Glucose, UA: NEGATIVE
Ketones, UA: NEGATIVE
Nitrite, UA: NEGATIVE
Protein, UA: POSITIVE — AB
Spec Grav, UA: >=1.03 — AB (ref 1.010–1.025)
Urobilinogen, UA: 1 E.U./dL
pH, UA: 6 (ref 5.0–8.0)

## 2018-08-19 MED ORDER — SULFAMETHOXAZOLE-TRIMETHOPRIM 800-160 MG PO TABS: 1.0000 | ORAL_TABLET | Freq: Two times a day (BID) | ORAL | 0 refills | Status: AC

## 2018-08-19 MED ORDER — CEFTRIAXONE SODIUM 1 G IJ SOLR
1.0000 g | Freq: Once | INTRAMUSCULAR | Status: AC
  Administered 2018-08-19: 1 g via INTRAMUSCULAR

## 2018-08-19 MED FILL — SULFAMETHOXAZOLE-TMP DS TAB: 800-160 | 3 days supply | Qty: 6 | Fill #0

## 2018-08-19 NOTE — Progress Notes (Signed)
Chief Complaint  Patient presents with  . Dysuria  . Back Pain    Lindsay Wade is a 53 y.o. female here for possible UTI.  Duration: 1 week. Symptoms: urinary frequency and dysuria, R sided flank pain Denies: hematuria, urinary retention, fever, nausea and vomiting, vaginal discharge Hx of recurrent UTI? No  ROS:  Constitutional: denies fever GU: As noted in HPI  Past Medical History:  Diagnosis Date  . Essential hypertension 04/15/2017  . Headache   . Hypothyroid   . Hypothyroidism   . MRSA infection     BP 120/72 (BP Location: Left Arm, Patient Position: Sitting, Cuff Size: Normal)   Pulse 90   Temp 98.5 F (36.9 C) (Oral)   Ht 5\' 6"  (1.676 m)   Wt 237 lb 6 oz (107.7 kg)   SpO2 96%   BMI 38.31 kg/m  General: Awake, alert, appears stated age Heart: RRR Lungs: CTAB, normal respiratory effort, no accessory muscle usage Abd: BS+, soft, NT, ND, no masses or organomegaly MSK: +R sided CVA tenderness, neg Lloyd's sign Psych: Age appropriate judgment and insight  Acute cystitis with hematuria - Plan: sulfamethoxazole-trimethoprim (BACTRIM DS,SEPTRA DS) 800-160 MG tablet  Orders as above. Rocephin today to cover for possible early pyelonephritis. Start Bactrim tomorrow. Cx sent . Stay hydrated. Seek immediate care if pt starts to develop fevers, new/worsening symptoms, uncontrollable N/V. F/u prn. The patient voiced understanding and agreement to the plan.  [place], DO 08/19/18 3:31 PM

## 2018-08-19 NOTE — Addendum Note (Signed)
Addended by: Sharon Seller B on: 08/19/2018 04:08 PM   Modules accepted: Orders

## 2018-08-19 NOTE — Patient Instructions (Addendum)
Stay hydrated.   Warning signs/symptoms: Uncontrollable nausea/vomiting, fevers, worsening symptoms despite treatment, confusion.  Give [place] around 2 business days to get culture back to you.  Start the oral antibiotic tomorrow.  Let us know if you need anything.

## 2018-08-20 ENCOUNTER — Ambulatory Visit: Payer: 59 | Admitting: Family Medicine

## 2018-08-20 DIAGNOSIS — N3001 Acute cystitis with hematuria: Secondary | ICD-10-CM | POA: Diagnosis not present

## 2018-08-21 LAB — URINE CULTURE
MICRO NUMBER:: 197249
Result:: NO GROWTH
SPECIMEN QUALITY:: ADEQUATE

## 2018-08-30 ENCOUNTER — Other Ambulatory Visit: Payer: Self-pay | Admitting: Family Medicine

## 2018-08-30 MED ORDER — LISINOPRIL 20 MG PO TABS: 20.0000 mg | ORAL_TABLET | Freq: Every day | ORAL | 1 refills | Status: DC

## 2018-08-30 MED FILL — LISINOPRIL 20 MG TABLET: 20 | 90 days supply | Qty: 90 | Fill #0

## 2018-08-30 NOTE — Telephone Encounter (Signed)
Copied from [place] [id number]. Topic: Quick Communication - Rx Refill/Question >> Aug 30, 2018 10:46 AM Yvette Rack wrote: Medication: lisinopril (PRINIVIL,ZESTRIL) 20 MG tablet  Has the patient contacted their pharmacy? yes   Preferred Pharmacy (with phone number or street name): [place], [address]. [phone] (Phone) [phone] (Fax)  Agent: Please be advised that RX refills may take up to 3 business days. We ask that you follow-up with your pharmacy.

## 2018-09-08 DIAGNOSIS — G4733 Obstructive sleep apnea (adult) (pediatric): Secondary | ICD-10-CM | POA: Diagnosis not present

## 2018-09-09 ENCOUNTER — Encounter: Payer: Self-pay | Admitting: Family Medicine

## 2018-09-10 ENCOUNTER — Encounter: Payer: Self-pay | Admitting: Family Medicine

## 2018-09-10 ENCOUNTER — Telehealth: Payer: Self-pay | Admitting: Family Medicine

## 2018-09-10 NOTE — Telephone Encounter (Signed)
Copied from [place] [id number]. Topic: General - Other >> Sep 10, 2018  9:19 AM Oneta Rack wrote: Patient wanted to ensure PCP review my chart message and would like to pick up Dr. Note today. Patient also requesting office visit notes reflecting 04/05/2018 please call mobile # [phone]    Letter done and patient informed///will pickup at the front desk.

## 2018-09-10 NOTE — Telephone Encounter (Signed)
Erroneous, please disregard

## 2018-09-23 ENCOUNTER — Telehealth: Payer: Self-pay

## 2018-09-23 ENCOUNTER — Ambulatory Visit: Payer: 59 | Admitting: Family Medicine

## 2018-09-23 NOTE — Telephone Encounter (Signed)
I don't think I can write someone out for that, unfortunately. TY.

## 2018-09-23 NOTE — Telephone Encounter (Signed)
Copied from [place] [id number]. 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 Asking for a call back with an answer please. Ph# [phone]

## 2018-09-23 NOTE — Telephone Encounter (Signed)
Called the patient informed of PCP instructions. She states she does not need it after all as is going up [place] to see her Mom and taking time off.

## 2018-09-29 ENCOUNTER — Ambulatory Visit: Payer: 59 | Admitting: Nurse Practitioner

## 2018-10-07 ENCOUNTER — Encounter: Payer: Self-pay | Admitting: Family Medicine

## 2018-10-07 ENCOUNTER — Ambulatory Visit (INDEPENDENT_AMBULATORY_CARE_PROVIDER_SITE_OTHER): Payer: 59 | Admitting: Family Medicine

## 2018-10-07 ENCOUNTER — Other Ambulatory Visit: Payer: Self-pay

## 2018-10-07 DIAGNOSIS — G4733 Obstructive sleep apnea (adult) (pediatric): Secondary | ICD-10-CM | POA: Diagnosis not present

## 2018-10-07 NOTE — Progress Notes (Signed)
PATIENT: Lindsay Wade DOB: 1966/04/23  REASON FOR VISIT: follow up HISTORY FROM: patient  Virtual Visit via Telephone Note  I connected with Lindsay Wade on 10/07/18 at  2:30 PM EDT by telephone and verified that I am speaking with the correct person using two identifiers.   I discussed the limitations, risks, security and privacy concerns of performing an evaluation and management service by telephone and the availability of in person appointments. I also discussed with the patient that there may be a patient responsible charge related to this service. The patient expressed understanding and agreed to proceed.   History of Present Illness:  10/07/18 Lindsay Wade is a 53 y.o. female for follow up of OSA on BiPAP.  Lindsay Wade reports that she is doing very well with BiPAP.  She is tolerating therapy well.  She can feel a significant difference in her energy levels and feels that she is sleeping better.  Download report as follows:  09/05/2018 - 10/04/2018 Usage 09/05/2018 - 10/04/2018 Usage days 30/30 days (100%) >= 4 hours 29 days (97%) < 4 hours 1 days (3%) Usage hours 237 hours 27 minutes Average usage (total days) 7 hours 55 minutes Average usage (days used) 7 hours 55 minutes Median usage (days used) 8 hours 9 minutes Total used hours (value since last reset - 10/04/2018) 3,127 hours AirCurve 10 VAuto Serial number [id number] Mode Spont IPAP 16 cmH2O EPAP 12 cmH2O Easy-Breathe On Therapy Leaks - L/min Median: 0.6 95th percentile: 11.8 Maximum: 26.7 Events per hour AI: 2.2 HI: 0.7 AHI: 2.9 Apnea Index Central: 1.6 Obstructive: 0.4 Unknown: 0.1   History (copied from [place] [facility]'s note on 04/07/2018)  UPDATE 10/2/2019CM Lindsay Wade30, 53 year old female returns for follow-up with history of severe obstructive sleep apnea on BiPAP.  Compliance data dated 03/07/2018-04/05/2018 shows compliance greater than 4 hours at 83%.  Average usage 6 hours 25 minutes set  pressure 12 to 16 cm leak 95th percentile 3.1.  AHI 5.7.  ESS 1 patient did not use her Bipap for 4 days due to bronchitis.  She is still taking the antibiotic she returns for reevaluation  UPDATE 4/2/2019CM  Lindsay Wade31, 53 year old female returns for follow-up for initial BiPAP compliance after being diagnosed with severe obstructive sleep apnea.  She says I do not like using the machine however it makes me feel much better.  BiPAP compliance dated 09/05/2017 -10/04/2017 shows compliance greater than 4 hours at 100%.  Average usage 7 hours 26 minutes.  Set pressure of 12-16 cm AHI 2.4 ESS 1 fatigue severity score 15.  She returns for reevaluation 05/26/17 CDMichele Wade a married, right handed51 y.o.female[place], seen here as a referral from Dr. Jaynee Wade for a sleep consultation. Chief complaint according to patient :" I have bad, bad headaches in the morning and high blood pressure. "  LindsayMcDevittpresents today as a new patient to the sleep clinic but an established headache patient of Dr. Jaynee Wade.She reports that over the last 3 months she had escalating headaches whichbothered her mostly in the morning. For a while it was thought that if her blood pressure is better controlled her headaches would go away but this was not the case. Dr. Wille Celeste an MRI of the brain and also cervical spine images which returned normal. The patient had mentioned that her mother suffered from meningioma.  ThePatient also describes that she feels hot as if having a fever or viral infection, she feels weak and tired and would like to rest with  her headaches. The headaches are present in the morning but they do not wake her from sleep. Sometimes a Wade more tension-like other times pounding and pulsatile, she is not bothered by movement light or loud sounds. She is not squeezy. There has been no dizziness, no shortness of breath and no chest pain. Her husband has told  her that she is a family with snoring and 1 of our avenues to evaluate her headache will be to rule out sleep apnea.  Mrs. Lindsay Wade informed me that she had previously been diagnosed with sleep apnea in August 2014 in [place], [place]. The [facility] for sleep studies. I have only a copy for a CPAP titration which does not "the results of the baseline sleep study. "It was a baseline RDI and not AHI. 13.2/h with an Epworth sleepiness score at the time endorsed at 13 points. CPAP titration explored pressures beginning at 7 cm and changed finally to BiPAP at 10/6 centimeter water. The patient was deemed intolerant despite use of various mask styles, types and sizes. The patient requested to end the study prematurely and left the lab.  Sleep habits are as follows: Her husband has moved on to another bedroom as he is bothered by her snoring. The usual bedtime for the patient is 10:30 PM and she does not have trouble initiating sleep. She had a fullness feeling in her throat, felt that her airway was partially blocked and began sleeping more reclined rather than flat. This has helped. She sleeps on her bed- but now in supine on one pillow.She describes her bedroom is cool, quiet and dark -conducive to sleep. She gets up twice to go to the bathroom, around 4 and 6 AM. She rises at 6.30 AM spontaneously, on weekends until 8 AM. Daytime naps are unscheduled- and she falls asleep after lunch.  Observations/Objective:  Generalized: Well developed, in no acute distress  Mentation: Alert oriented to time, place, history taking. Follows all commands speech and language fluent   Assessment and Plan:  53 y.o. year old female  has a past medical history of Essential hypertension (04/15/2017), Headache, Hypothyroid, Hypothyroidism, and MRSA infection. with    ICD-10-CM   1. Obstructive sleep apnea treated with BiPAP G47.33     Lindsay Wade is doing great on BiPAP therapy.  She is  compliant with daily usage.  I have encouraged daily use in greater than 4 hours each day.  She does not need supplies at this time.  She does report concerns of needing the head piece more frequently than every 6 months, however, her insurance will not pay for this replacement more frequently.  She is able to purchase the device on [employer].  She denies any other concerns at this time.  I have advised regular annual follow-up, sooner if any issues or concerns were to arise.  She verbalizes understanding and agreement with this plan.  No orders of the defined types were placed in this encounter.   No orders of the defined types were placed in this encounter.    Follow Up Instructions:  I discussed the assessment and treatment plan with the patient. The patient was provided an opportunity to ask questions and all were answered. The patient agreed with the plan and demonstrated an understanding of the instructions.   The patient was advised to call back or seek an in-person evaluation if the symptoms worsen or if the condition fails to improve as anticipated.  I provided 25 minutes of non-face-to-face time during this  encounter.  Patient is in her car during time of video visit.  Provider is at her place of residence.  Unfortunately due to service/connection issues we are unable to complete video visit.  We were able to proceed with telephone visit.   Debbora Presto, NP

## 2018-10-21 MED FILL — SYNTHROID 150 MCG TABLET: 150 | 90 days supply | Qty: 90 | Fill #0

## 2018-10-21 MED FILL — DEXILANT DR 60 MG CAPSULE: 60 | 30 days supply | Qty: 30 | Fill #0

## 2018-11-15 ENCOUNTER — Telehealth: Payer: Self-pay | Admitting: Family Medicine

## 2018-11-15 NOTE — Telephone Encounter (Signed)
Left message on pt's VM req call back to schedule 1 year f/u with Amy

## 2018-12-02 MED FILL — LISINOPRIL 20 MG TABLET: 20 | 90 days supply | Qty: 90 | Fill #1

## 2018-12-08 DIAGNOSIS — G4733 Obstructive sleep apnea (adult) (pediatric): Secondary | ICD-10-CM | POA: Diagnosis not present

## 2019-01-15 ENCOUNTER — Encounter: Payer: Self-pay | Admitting: Family Medicine

## 2019-01-16 ENCOUNTER — Telehealth: Payer: 59 | Admitting: Family

## 2019-01-16 DIAGNOSIS — R399 Unspecified symptoms and signs involving the genitourinary system: Secondary | ICD-10-CM | POA: Diagnosis not present

## 2019-01-16 MED ORDER — DOXYCYCLINE HYCLATE 100 MG PO TABS: 100.0000 mg | ORAL_TABLET | Freq: Two times a day (BID) | ORAL | 0 refills | Status: DC

## 2019-01-16 MED ORDER — CEPHALEXIN 500 MG PO CAPS: 500.0000 mg | ORAL_CAPSULE | Freq: Two times a day (BID) | ORAL | 0 refills | Status: DC

## 2019-01-16 NOTE — Progress Notes (Signed)

## 2019-01-16 NOTE — Addendum Note (Signed)
Addended by: Evelina Dun A on: 01/16/2019 04:16 PM   Modules accepted: Orders

## 2019-01-17 ENCOUNTER — Other Ambulatory Visit: Payer: Self-pay

## 2019-01-17 ENCOUNTER — Encounter: Payer: Self-pay | Admitting: Family Medicine

## 2019-01-17 ENCOUNTER — Ambulatory Visit: Payer: 59 | Admitting: Family Medicine

## 2019-01-17 DIAGNOSIS — N3001 Acute cystitis with hematuria: Secondary | ICD-10-CM | POA: Diagnosis not present

## 2019-01-17 LAB — POC URINALSYSI DIPSTICK (AUTOMATED)
Bilirubin, UA: NEGATIVE
Glucose, UA: NEGATIVE
Ketones, UA: NEGATIVE
Leukocytes, UA: NEGATIVE
Nitrite, UA: NEGATIVE
Protein, UA: NEGATIVE
Spec Grav, UA: 1.01 (ref 1.010–1.025)
Urobilinogen, UA: 0.2 E.U./dL
pH, UA: 6 (ref 5.0–8.0)

## 2019-01-17 MED ORDER — SULFAMETHOXAZOLE-TRIMETHOPRIM 800-160 MG PO TABS: 1.0000 | ORAL_TABLET | Freq: Two times a day (BID) | ORAL | 0 refills | Status: DC

## 2019-01-17 MED FILL — SULFAMETHOXAZOLE-TMP DS TAB: 800-160 | 3 days supply | Qty: 6 | Fill #0

## 2019-01-17 NOTE — Addendum Note (Signed)
Addended by: Sharon Seller B on: 01/17/2019 10:32 AM   Modules accepted: Orders

## 2019-01-17 NOTE — Patient Instructions (Addendum)
If you do not hear anything about your referral in the next 1-2 weeks, call our office and ask for an update.  Stay hydrated.   Warning signs/symptoms: Uncontrollable nausea/vomiting, fevers, worsening symptoms despite treatment, confusion.  Give us around 2 business days to get culture back to you.  Let us know if you need anything. 

## 2019-01-17 NOTE — Progress Notes (Signed)
Chief Complaint  Patient presents with  . Dysuria  . Back Pain  . Abdominal Pain    Lindsay Wade is a 53 y.o. female here for possible UTI.  Duration: 1 day. Symptoms: urinary frequency, urinary hesitancy and dysuria Denies: hematuria, urinary retention, fever, nausea and vomiting, vaginal discharge Hx of recurrent UTI? No   Issues with wt loss, wishes to see MWM.  ROS:  Constitutional: denies fever GU: As noted in HPI  Past Medical History:  Diagnosis Date  . Essential hypertension 04/15/2017  . Headache   . Hypothyroid   . Hypothyroidism   . MRSA infection     BP 128/78 (BP Location: Left Arm, Patient Position: Sitting, Cuff Size: Large)   Pulse 81   Temp 98.3 F (36.8 C) (Oral)   Ht 5\' 6"  (1.676 m)   Wt 234 lb (106.1 kg)   SpO2 99%   BMI 37.77 kg/m  General: Awake, alert, appears stated age Heart: RRR Lungs: CTAB, normal respiratory effort, no accessory muscle usage Abd: BS+, soft, +ttp in suprapubic region, ND, no masses or organomegaly MSK: No CVA tenderness, neg Lloyd's sign Psych: Age appropriate judgment and insight  Morbid obesity ([place]) - Plan: Amb Ref to Medical Weight Management  Acute cystitis with hematuria - Plan: sulfamethoxazole-trimethoprim (BACTRIM DS) 800-160 MG tablet, cx  Orders as above. Stay hydrated. Seek immediate care if pt starts to develop fevers, new/worsening symptoms, uncontrollable N/V. F/u prn. The patient voiced understanding and agreement to the plan.  [place], DO 01/17/19 10:26 AM

## 2019-01-18 LAB — URINE CULTURE
MICRO NUMBER:: 660434
Result:: NO GROWTH
SPECIMEN QUALITY:: ADEQUATE

## 2019-01-19 ENCOUNTER — Other Ambulatory Visit: Payer: Self-pay | Admitting: Family Medicine

## 2019-02-15 ENCOUNTER — Other Ambulatory Visit: Payer: Self-pay | Admitting: Family Medicine

## 2019-02-15 DIAGNOSIS — H524 Presbyopia: Secondary | ICD-10-CM | POA: Diagnosis not present

## 2019-02-15 MED FILL — LISINOPRIL 20 MG TABLET: 20 | 90 days supply | Qty: 90 | Fill #0

## 2019-02-15 MED FILL — SYNTHROID 150 MCG TABLET: 150 | 90 days supply | Qty: 90 | Fill #1

## 2019-02-22 ENCOUNTER — Ambulatory Visit (INDEPENDENT_AMBULATORY_CARE_PROVIDER_SITE_OTHER): Payer: 59 | Admitting: Family Medicine

## 2019-02-22 ENCOUNTER — Other Ambulatory Visit: Payer: Self-pay

## 2019-02-22 ENCOUNTER — Encounter (INDEPENDENT_AMBULATORY_CARE_PROVIDER_SITE_OTHER): Payer: Self-pay | Admitting: Family Medicine

## 2019-02-22 VITALS — BP 131/86 | HR 64 | Temp 98.0°F | Ht 66.0 in | Wt 238.0 lb

## 2019-02-22 DIAGNOSIS — E063 Autoimmune thyroiditis: Secondary | ICD-10-CM

## 2019-02-22 DIAGNOSIS — Z1331 Encounter for screening for depression: Secondary | ICD-10-CM

## 2019-02-22 DIAGNOSIS — Z9189 Other specified personal risk factors, not elsewhere classified: Secondary | ICD-10-CM

## 2019-02-22 DIAGNOSIS — Z0289 Encounter for other administrative examinations: Secondary | ICD-10-CM

## 2019-02-22 DIAGNOSIS — E66812 Obesity, class 2: Secondary | ICD-10-CM

## 2019-02-22 DIAGNOSIS — I10 Essential (primary) hypertension: Secondary | ICD-10-CM

## 2019-02-22 DIAGNOSIS — G4733 Obstructive sleep apnea (adult) (pediatric): Secondary | ICD-10-CM | POA: Diagnosis not present

## 2019-02-22 DIAGNOSIS — R5383 Other fatigue: Secondary | ICD-10-CM | POA: Diagnosis not present

## 2019-02-22 DIAGNOSIS — Z6838 Body mass index (BMI) 38.0-38.9, adult: Secondary | ICD-10-CM | POA: Diagnosis not present

## 2019-02-22 IMAGING — MR MR HEAD WO/W CM
10 of 12 series · 34 of 48 positions shown · IV contrast (multihance)
Comparison: None.

CLINICAL DATA: 50-year-old female with daily headaches for 3
months. Occasional blurred vision and radiating pain.

EXAM:
MRI HEAD WITHOUT AND WITH CONTRAST
TECHNIQUE: Multiplanar, multiecho pulse sequences of the brain and surrounding
structures were obtained without and with intravenous contrast.
CONTRAST:  20mL MULTIHANCE GADOBENATE DIMEGLUMINE 529 MG/ML IV SOLN

[Series 3: DWI · axial · 3.0mm · 0.94mm/px · z∈[-49,+98]mm · 9 of 100 slices shown (1 of 2)]
[im 1/100]
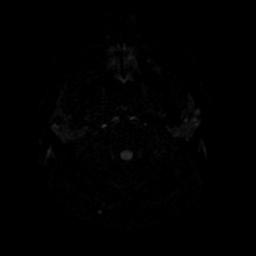
[im 13/100]
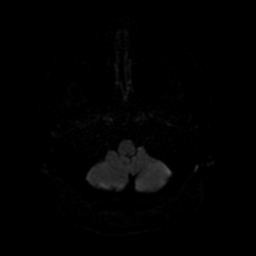
[im 25/100]
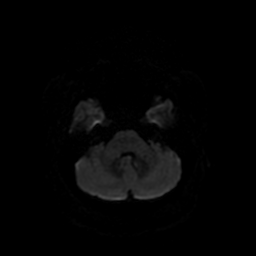
[im 38/100]
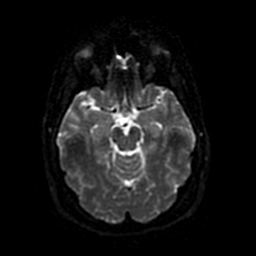
[im 50/100]
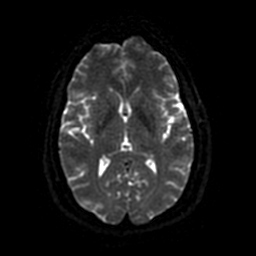
[im 62/100]
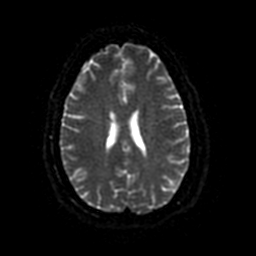
[im 75/100]
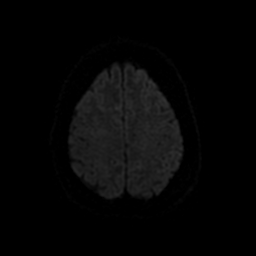
[im 87/100]
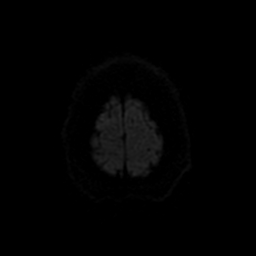
[im 100/100]
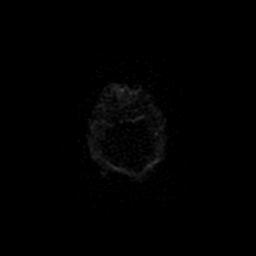

[Series 4: DWI · coronal · 4.0mm · 0.94mm/px · 6 of 68 slices shown (2 of 2)]
[im 1/68]
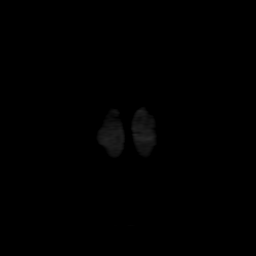
[im 14/68]
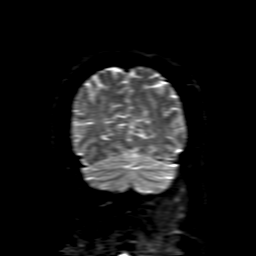
[im 27/68]
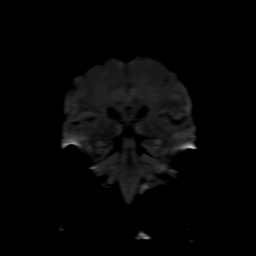
[im 41/68]
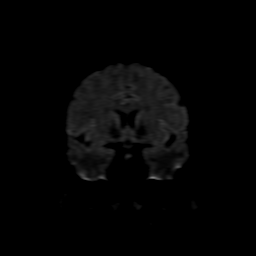
[im 54/68]
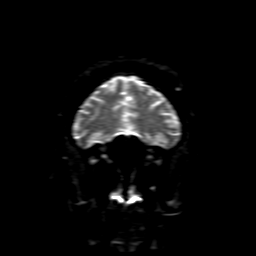
[im 68/68]
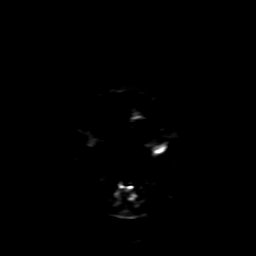

[Series 5: FLAIR · sagittal · 5.0mm · 0.47mm/px · 2 of 26 slices shown (1 of 2)]
[im 1/26]
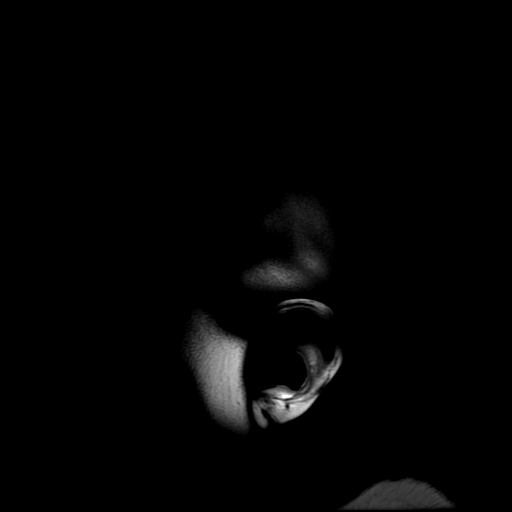
[im 26/26]
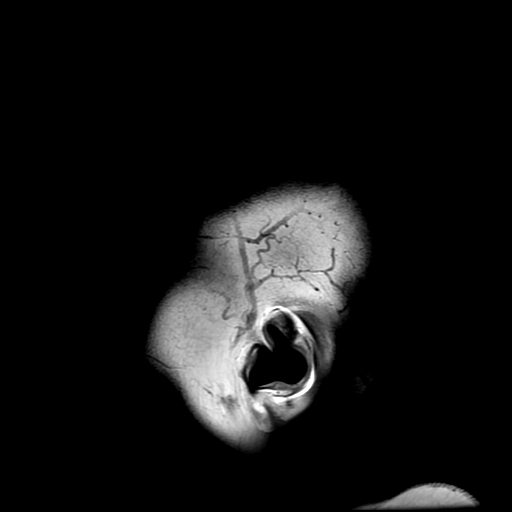

[Series 6: T2 · axial · 5.0mm · 0.47mm/px · z∈[-45,+99]mm · 2 of 25 slices shown]
[im 1/25]
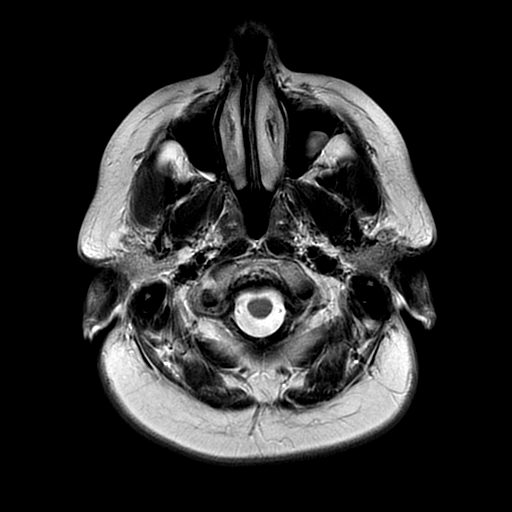
[im 25/25]
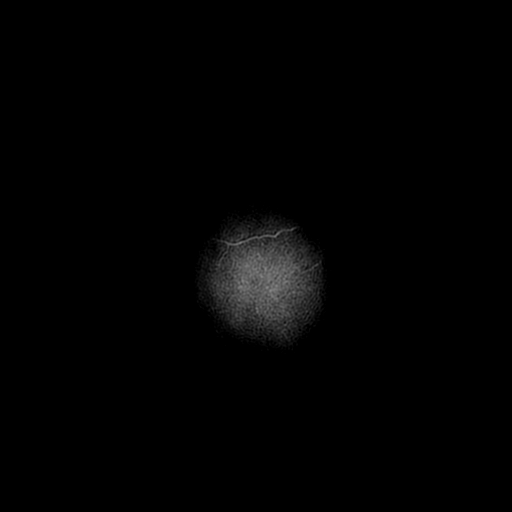

[Series 7: FLAIR · axial · 3.0mm · 0.45mm/px · z∈[-53,+91]mm · 2 of 25 slices shown (2 of 2)]
[im 1/25]
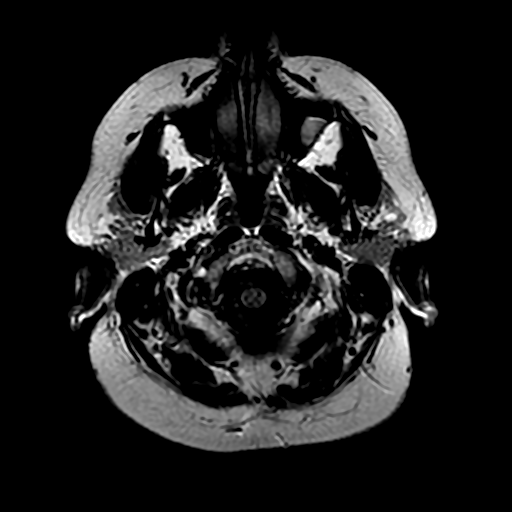
[im 25/25]
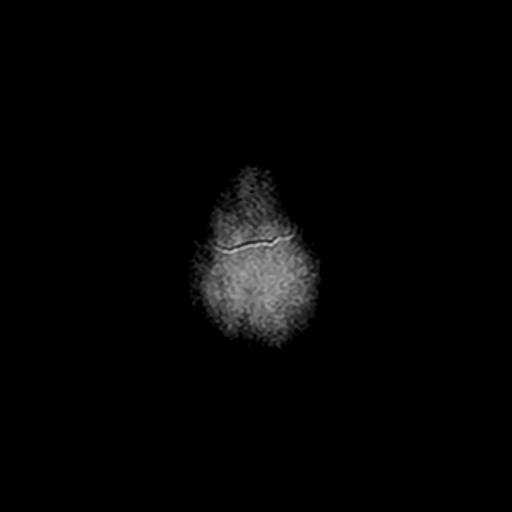

[Series 8: (person_name) · axial · 3.0mm · 0.47mm/px · z∈[-50,-31]mm · 2 of 96 slices shown]
[im 1/96]
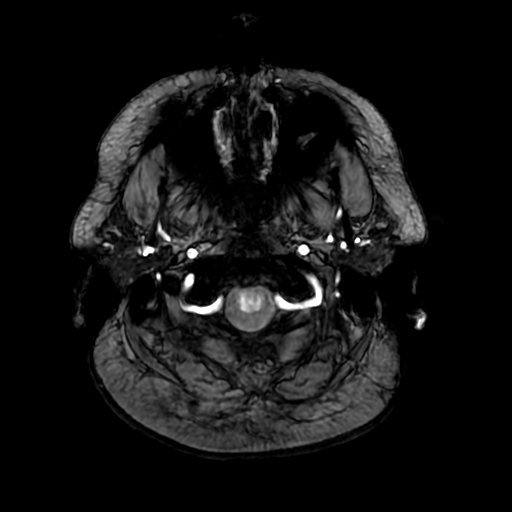
[im 14/96]
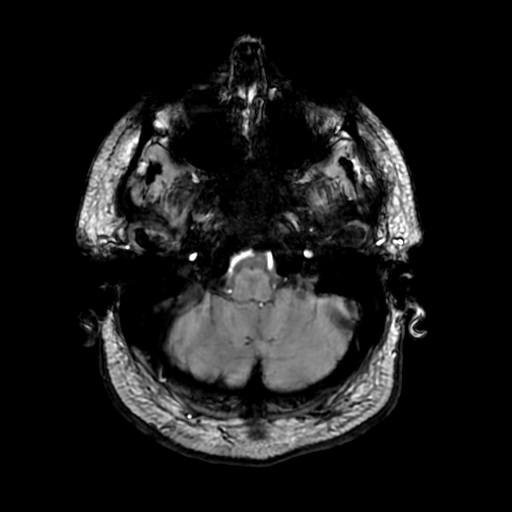

[Series 10: T2 post-contrast · coronal · 5.0mm · 0.39mm/px · 2 of 27 slices shown]
[im 1/27]
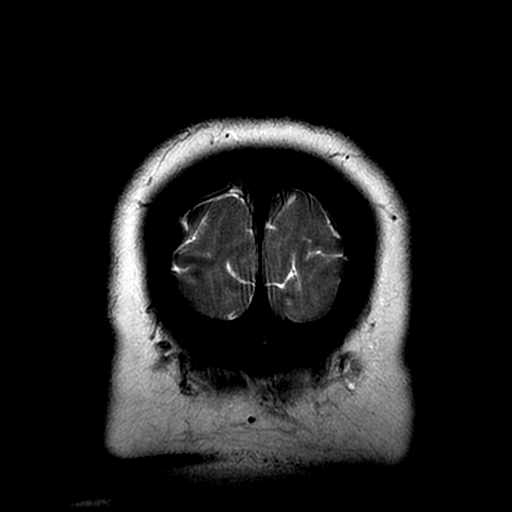
[im 27/27]
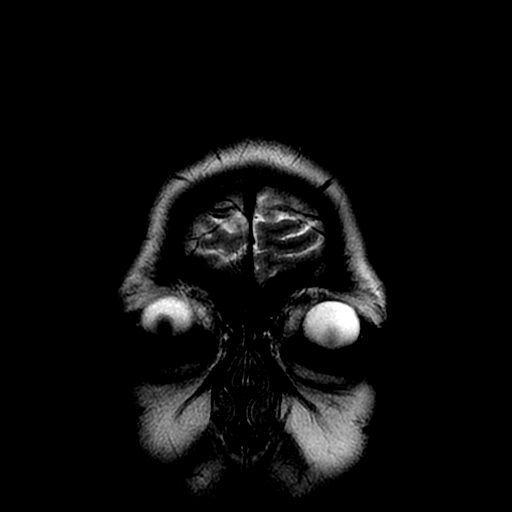

[Series 12: T1 · coronal · 5.0mm · 0.43mm/px · 2 of 28 slices shown]
[im 1/28]
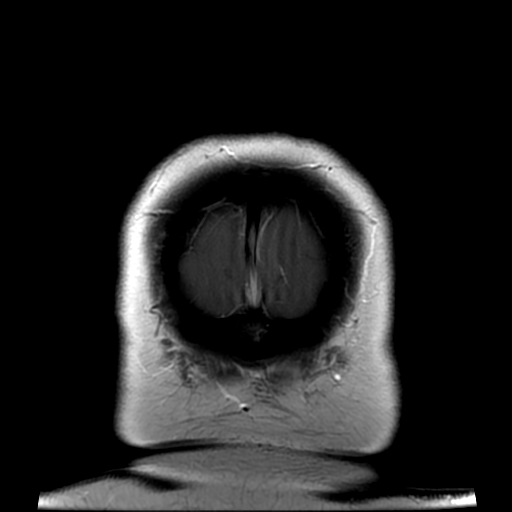
[im 28/28]
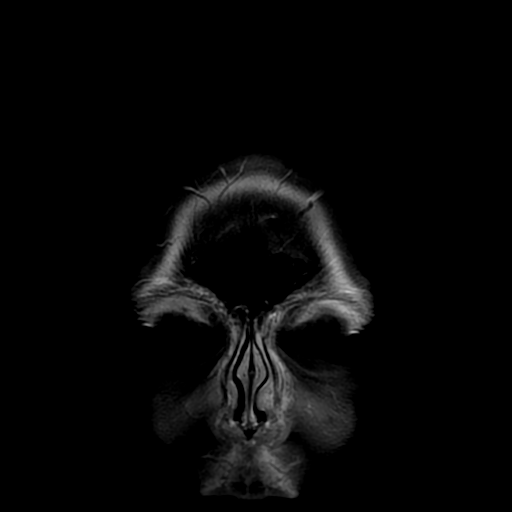

[Series 350: ADC · axial · 3.0mm · 0.94mm/px · z∈[-49,+98]mm · 4 of 50 slices shown (1 of 2)]
[im 1/50]
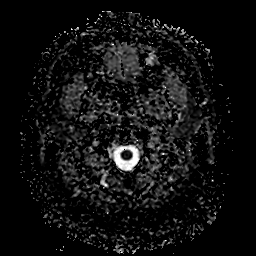
[im 17/50]
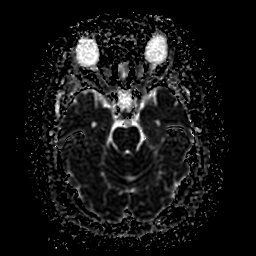
[im 33/50]
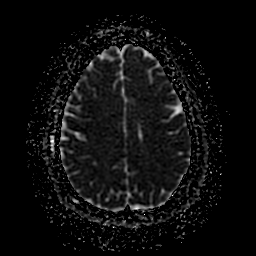
[im 50/50]
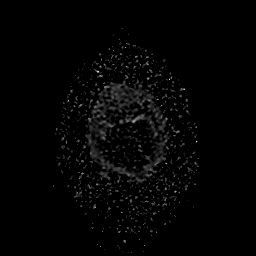

[Series 450: ADC · coronal · 4.0mm · 0.94mm/px · 3 of 34 slices shown (2 of 2)]
[im 1/34]
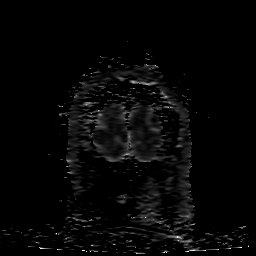
[im 17/34]
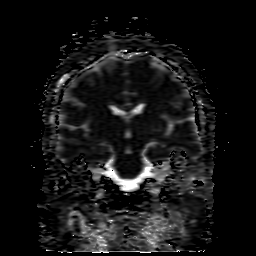
[im 34/34]
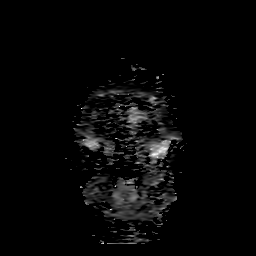

[34 of 48 positions shown; findings below may reference images not displayed]

FINDINGS: Brain: Cerebral volume is within normal limits. No restricted
diffusion to suggest acute infarction. No midline shift, mass
effect, evidence of mass lesion, ventriculomegaly, extra-axial
collection or acute intracranial hemorrhage. Cervicomedullary
junction and pituitary are within normal limits.

Gray and white matter signal is within normal limits throughout the
brain. No encephalomalacia or chronic cerebral blood products. No
abnormal enhancement identified. No dural thickening.

Vascular: Major intracranial vascular flow voids appear normal. The
major dural venous sinuses are enhancing and appear patent.

Skull and upper cervical spine: Small multilocular cystic appearing
changes along the left junction of the C2 and C3 vertebral bodies
(series 10, image 12). This is incompletely evaluated.

Otherwise Negative.  Normal bone marrow signal elsewhere.

Sinuses/Orbits: Orbits soft tissues are mildly degraded by
susceptibility artifact, otherwise normal. Mild maxillary sinus
mucosal thickening greater on the left. Other paranasal sinuses are
clear.

Other: Visible internal auditory structures appear normal. Mastoid
air cells are clear. Negative scalp and face soft tissues.
IMPRESSION: 1. Normal MRI appearance of the brain. No explanation for headache.
2. Partially visible clustered cystic appearing changes along the
left aspect of the C2 and C3 vertebral junction. This is
incompletely evaluated but might be degenerative. Recommend a
dedicated Cervical spine MRI (without and with contrast preferred)
to further characterize.

## 2019-02-23 LAB — TSH: TSH: 0.568 u[IU]/mL (ref 0.450–4.500)

## 2019-02-23 LAB — LIPID PANEL WITH LDL/HDL RATIO
Cholesterol, Total: 235 mg/dL — ABNORMAL HIGH (ref 100–199)
HDL: 72 mg/dL (ref >39)
LDL Calculated: 141 mg/dL — ABNORMAL HIGH (ref 0–99)
LDl/HDL Ratio: 2 ratio (ref 0.0–3.2)
Triglycerides: 112 mg/dL (ref 0–149)
VLDL Cholesterol Cal: 22 mg/dL (ref 5–40)

## 2019-02-23 LAB — COMPREHENSIVE METABOLIC PANEL
ALT: 14 IU/L (ref 0–32)
AST: 14 IU/L (ref 0–40)
Albumin/Globulin Ratio: 2.3 — ABNORMAL HIGH (ref 1.2–2.2)
Albumin: 4.8 g/dL (ref 3.8–4.8)
Alkaline Phosphatase: 82 IU/L (ref 39–117)
BUN/Creatinine Ratio: 19 (ref 9–23)
BUN: 17 mg/dL (ref 6–24)
Bilirubin Total: 0.4 mg/dL (ref 0.0–1.2)
CO2: 24 mmol/L (ref 20–29)
Calcium: 9.5 mg/dL (ref 8.7–10.2)
Chloride: 101 mmol/L (ref 96–106)
Creatinine, Ser: 0.88 mg/dL (ref 0.57–1.00)
GFR calc Af Amer: 89 mL/min/{1.73_m2} (ref >59)
GFR calc non Af Amer: 77 mL/min/{1.73_m2} (ref >59)
Globulin, Total: 2.1 g/dL (ref 1.5–4.5)
Glucose: 90 mg/dL (ref 65–99)
Potassium: 4.5 mmol/L (ref 3.5–5.2)
Sodium: 140 mmol/L (ref 134–144)
Total Protein: 6.9 g/dL (ref 6.0–8.5)

## 2019-02-23 LAB — CBC WITH DIFFERENTIAL
Basophils Absolute: 0 10*3/uL (ref 0.0–0.2)
Basos: 1 %
EOS (ABSOLUTE): 0 10*3/uL (ref 0.0–0.4)
Eos: 1 %
Hematocrit: 41.3 % (ref 34.0–46.6)
Hemoglobin: 13.4 g/dL (ref 11.1–15.9)
Immature Grans (Abs): 0 10*3/uL (ref 0.0–0.1)
Immature Granulocytes: 0 %
Lymphocytes Absolute: 2.2 10*3/uL (ref 0.7–3.1)
Lymphs: 41 %
MCH: 28.5 pg (ref 26.6–33.0)
MCHC: 32.4 g/dL (ref 31.5–35.7)
MCV: 88 fL (ref 79–97)
Monocytes Absolute: 0.4 10*3/uL (ref 0.1–0.9)
Monocytes: 7 %
Neutrophils Absolute: 2.8 10*3/uL (ref 1.4–7.0)
Neutrophils: 50 %
RBC: 4.7 x10E6/uL (ref 3.77–5.28)
RDW: 13 % (ref 11.7–15.4)
WBC: 5.4 10*3/uL (ref 3.4–10.8)

## 2019-02-23 LAB — INSULIN, RANDOM: INSULIN: 11.9 u[IU]/mL (ref 2.6–24.9)

## 2019-02-23 LAB — T4, FREE: Free T4: 1.64 ng/dL (ref 0.82–1.77)

## 2019-02-23 LAB — HEMOGLOBIN A1C
Est. average glucose Bld gHb Est-mCnc: 111 mg/dL
Hgb A1c MFr Bld: 5.5 % (ref 4.8–5.6)

## 2019-02-23 LAB — VITAMIN D 25 HYDROXY (VIT D DEFICIENCY, FRACTURES): Vit D, 25-Hydroxy: 15.7 ng/mL — ABNORMAL LOW (ref 30.0–100.0)

## 2019-02-23 LAB — T3: T3, Total: 93 ng/dL (ref 71–180)

## 2019-02-23 LAB — FOLATE: Folate: 7.3 ng/mL (ref >3.0)

## 2019-02-23 LAB — VITAMIN B12: Vitamin B-12: 322 pg/mL (ref 232–1245)

## 2019-02-23 NOTE — Progress Notes (Signed)
Office: [phone]  /  Fax: [phone]   Dear Dr. Riki Sheer,   Thank you for referring Lindsay Wade to our clinic. The following note includes my evaluation and treatment recommendations.  HPI:   Chief Complaint: Lindsay Wade has been referred by Dr. Riki Sheer for consultation regarding her obesity and obesity related comorbidities.    Lindsay Wade (MR# [id number]) is a 53 y.o. female who presents on 02/23/2019 for obesity evaluation and treatment. Current BMI is Body mass index is 38.41 kg/m.Lindsay Wade has been struggling with her weight for many years and has been unsuccessful in either losing weight, maintaining weight loss, or reaching her healthy weight goal.     Lindsay Wade attended our information session and states she is currently in the action stage of change and ready to dedicate time achieving and maintaining a healthier weight. Lindsay Wade is interested in becoming our patient and working on intensive lifestyle modifications including (but not limited to) diet, exercise and weight loss.    Lindsay Wade states her family eats meals together she thinks her family will eat healthier with  her her desired weight loss is 68 to 78 lbs she started gaining weight in 2004 she has significant food cravings issues  she skips breakfast probably everyday she frequently eats larger portions than normal  she struggles with emotional eating    Lindsay Wade feels her energy is lower than it should be. This has worsened with weight gain and has worsened recently. Lindsay Wade denies daytime somnolence and denies waking up still tired. Patient is at risk for obstructive sleep apnea.  Patient generally gets 7 hours of sleep per night, and states they generally have restful sleep. Snoring is not present since being on BiPAP. Apneic episodes are not present. Epworth Sleepiness Score is 2.  Dyspnea on exertion Lindsay Wade notes increasing shortness of breath with  exercising and seems to be worsening over time with weight gain. She notes getting out of breath sooner with activity than she used to. This has not gotten worse recently. Lindsay Wade denies orthopnea.  Obstructive Sleep Apnea (OSA) Lindsay Wade is stable on BiPAP. She is working on weight loss and denies chest pain or shortness of air at rest.  Hashimoto's Hypothyroidism Lindsay Wade has a diagnosis of hypothyroidism. She is on Synthroid. She denies chest pain or palpitations, but does admit to Lindsay.  Hypertension Lindsay Wade is a 53 y.o. female with hypertension and is on lisinopril.  Lindsay Wade denies chest pain, headache, or dizziness. She is working weight loss to help control her blood pressure with the goal of decreasing her risk of heart attack and stroke. Lindsay Wade's blood pressure is stable.  At risk for cardiovascular disease Lindsay Wade is at a higher than average risk for cardiovascular disease due to obesity. She currently denies any chest pain.  Depression Screen Lindsay Wade's Food and Mood (modified PHQ-9) score was 2. Depression screen Lindsay Wade  Decreased Interest 0  Down, Depressed, Hopeless 0  PHQ - 2 Score 0  Altered sleeping 0  Tired, decreased energy 1  Change in appetite 1  Feeling bad or failure about yourself  0  Trouble concentrating 0  Moving slowly or fidgety/restless 0  Suicidal thoughts 0  PHQ-9 Score 2   ASSESSMENT AND PLAN:  Other Lindsay - Plan: EKG 12-Lead, Vitamin B12, CBC With Differential, Folate, Hemoglobin A1c, Insulin, random, VITAMIN D 25 Hydroxy (Vit-D Deficiency, Fractures)  OSA (obstructive sleep apnea) - Plan: Lipid Panel With LDL/HDL  Ratio  Hashimoto's thyroiditis - Plan: T3, T4, free, TSH  Essential hypertension - Plan: Comprehensive metabolic panel  Depression screening  At risk for heart disease  Class 2 severe obesity with serious comorbidity and body mass index (BMI) of 38.0 to 38.9 in adult, unspecified obesity type  (Lindsay Wade)  PLAN:  Lindsay Lindsay Wade was informed that her Lindsay may be related to obesity, depression or many other causes. Labs will be ordered, and in the meanwhile Lindsay Wade has agreed to work on diet, exercise and weight loss to help with Lindsay. Proper sleep hygiene was discussed including the need for 7-8 hours of quality sleep each night. A sleep study was not ordered based on symptoms and Epworth score.  Dyspnea on exertion Lindsay Wade's shortness of breath appears to be obesity related and exercise induced. She has agreed to work on weight loss and gradually increase exercise to treat her exercise induced shortness of breath. If Lindsay Wade follows our instructions and loses weight without improvement of her shortness of breath, we will plan to refer to pulmonology. We will monitor this condition regularly. Lindsay Wade agrees to this plan.  Obstructive Sleep Apnea (OSA) Lindsay Wade will start diet prescription and will continue to monitor.  Hashimoto's Hypothyroidism Lindsay Wade was informed of the importance of good thyroid control to help with weight loss efforts. She was also informed that supertheraputic thyroid levels are dangerous and will not improve weight loss results. Lindsay Wade will have labs checked today and will follow-up as directed.  Hypertension We discussed sodium restriction, working on healthy weight loss, and a regular exercise program as the means to achieve improved blood pressure control. Lindsay Wade agreed with this plan and agreed to follow up as directed. We will continue to monitor her blood pressure as well as her progress with the above lifestyle modifications. She will have labs checked, start diet, and watch for signs of hypotension as she continues her lifestyle modifications.  Cardiovascular risk counseling Lindsay Wade was given extended (30 minutes) coronary artery disease prevention counseling today. She is 53 y.o. female and has risk factors for heart disease including obesity. We  discussed intensive lifestyle modifications today with an emphasis on specific weight loss instructions and strategies. Pt was also informed of the importance of increasing exercise and decreasing saturated fats to help prevent heart disease.  Depression Screen Alta had a negative depression screening. Depression is commonly associated with obesity and often results in emotional eating behaviors. We will monitor this closely and work on CBT to help improve the non-hunger eating patterns. Referral to Psychology may be required if no improvement is seen as she continues in our clinic.  Obesity Nasim is currently in the action stage of change and her goal is to continue with weight loss efforts. I recommend Takoya begin the structured treatment plan as follows:  She has agreed to follow the Category 3 plan.  Keianna has been instructed to eventually work up to a goal of 150 minutes of combined cardio and strengthening exercise per week for weight loss and overall health benefits. We discussed the following Behavioral Modification Strategies today: increasing lean protein intake, decreasing simple carbohydrates, and no skipping meals.    She was informed of the importance of frequent follow-up visits to maximize her success with intensive lifestyle modifications for her multiple health conditions. She was informed we would discuss her lab results at her next visit unless there is a critical issue that needs to be addressed sooner. Alycia agreed to keep her next visit at the agreed upon  time to discuss these results.  ALLERGIES: No Known Allergies  MEDICATIONS: Current Outpatient Medications on File Prior to Visit  Medication Sig Dispense Refill  . levothyroxine (SYNTHROID) 150 MCG tablet TAKE 1 TABLET BY MOUTH ONCE DAILY BEFORE BREAKFAST 90 tablet 4  . lisinopril (ZESTRIL) 20 MG tablet TAKE 1 TABLET BY MOUTH DAILY. 90 tablet 1   No current facility-administered medications on file prior  to visit.     PAST MEDICAL HISTORY: Past Medical History:  Diagnosis Date  . Essential hypertension 04/15/2017  . GERD (gastroesophageal reflux disease)   . Headache   . Hypothyroid   . Hypothyroidism   . MRSA infection   . OSA (obstructive sleep apnea)     PAST SURGICAL HISTORY: Past Surgical History:  Procedure Laterality Date  . BTS    . CESAREAN SECTION    . TONSILLECTOMY    . uterine ablation      SOCIAL HISTORY: Social History   Tobacco Use  . Smoking status: Never Smoker  . Smokeless tobacco: Never Used  Substance Use Topics  . Alcohol use: Yes    Comment: social drinker; very rare   . Drug use: No    FAMILY HISTORY: Family History  Problem Relation Age of Onset  . Hypertension Mother   . Diabetes Mother   . Hyperlipidemia Mother   . Thyroid disease Mother   . Hypertension Father   . Parkinsonism Father   . Hyperlipidemia Father   . Stroke Father   . Hyperlipidemia Sister    ROS: Review of Systems  Constitutional: Positive for malaise/Lindsay.  Eyes:       Positive for wearing glasses or contacts.  Respiratory: Negative for shortness of breath (at rest).   Cardiovascular: Negative for chest pain and palpitations.  Gastrointestinal: Positive for heartburn (GERD).  Neurological: Negative for dizziness and headaches.   PHYSICAL EXAM: Blood pressure 131/86, pulse 64, temperature 98 F (36.7 C), temperature source Oral, height 5\' 6"  (1.676 m), weight 238 lb (108 kg), SpO2 100 %. Body mass index is 38.41 kg/m. Physical Exam Vitals signs reviewed.  Constitutional:      Appearance: Normal appearance. She is well-developed. She is obese.  HENT:     Head: Normocephalic and atraumatic.     Nose: Nose normal.  Eyes:     General: No scleral icterus. Neck:     Musculoskeletal: Normal range of motion.  Cardiovascular:     Rate and Rhythm: Normal rate and regular rhythm.  Pulmonary:     Effort: Pulmonary effort is normal. No respiratory distress.   Abdominal:     Palpations: Abdomen is soft.     Tenderness: There is no abdominal tenderness.  Musculoskeletal: Normal range of motion.     Comments: Range of motion normal in all four extremities.  Skin:    General: Skin is warm and dry.  Neurological:     Mental Status: She is alert and oriented to person, place, and time.     Coordination: Coordination normal.  Psychiatric:        Mood and Affect: Mood and affect normal.        Behavior: Behavior normal.   RECENT LABS AND TESTS: BMET    Component Value Date/Time   NA 140 02/22/2019 0956   K 4.5 02/22/2019 0956   CL 101 02/22/2019 0956   CO2 24 02/22/2019 0956   GLUCOSE 90 02/22/2019 0956   GLUCOSE 96 12/17/2017 0737   BUN 17 02/22/2019 0956   CREATININE 0.88  02/22/2019 0956   CALCIUM 9.5 02/22/2019 0956   GFRNONAA 77 02/22/2019 0956   GFRAA 89 02/22/2019 0956   Lab Results  Component Value Date   HGBA1C 5.5 02/22/2019   Lab Results  Component Value Date   INSULIN 11.9 02/22/2019   CBC    Component Value Date/Time   WBC 5.4 02/22/2019 0956   WBC 6.1 12/17/2017 0737   RBC 4.70 02/22/2019 0956   RBC 4.24 12/17/2017 0737   HGB 13.4 02/22/2019 0956   HCT 41.3 02/22/2019 0956   PLT 265.0 12/17/2017 0737   MCV 88 02/22/2019 0956   MCH 28.5 02/22/2019 0956   MCHC 32.4 02/22/2019 0956   MCHC 33.3 12/17/2017 0737   RDW 13.0 02/22/2019 0956   LYMPHSABS 2.2 02/22/2019 0956   EOSABS 0.0 02/22/2019 0956   BASOSABS 0.0 02/22/2019 0956   Iron/TIBC/Ferritin/ %Sat No results found for: IRON, TIBC, FERRITIN, IRONPCTSAT Lipid Panel     Component Value Date/Time   CHOL 235 (H) 02/22/2019 0956   TRIG 112 02/22/2019 0956   HDL 72 02/22/2019 0956   CHOLHDL 4 12/17/2017 0737   VLDL 36.8 12/17/2017 0737   LDLCALC 141 (H) 02/22/2019 0956   Hepatic Function Panel     Component Value Date/Time   PROT 6.9 02/22/2019 0956   ALBUMIN 4.8 02/22/2019 0956   AST 14 02/22/2019 0956   ALT 14 02/22/2019 0956   ALKPHOS 82  02/22/2019 0956   BILITOT 0.4 02/22/2019 0956      Component Value Date/Time   TSH 0.568 02/22/2019 0956   TSH 5.35 (H) 12/17/2017 0737   TSH 0.47 02/18/2017 1346   No results found for: Vitamin D, 25-Hydroxy  ECG shows sinus rhythm with a rate of 66 BPM, low voltage in precordial leads, RSR(V1) -nondiagnostic, left axis. Diffuse nonspecific T-abnormality. Abnormal.   INDIRECT CALORIMETER done today shows a VO2 of 309 and a REE of 2154.  Her calculated basal metabolic rate is 6295 thus her basal metabolic rate is better than expected.  OBESITY BEHAVIORAL INTERVENTION VISIT  Today's visit was #1  Starting weight: 238 lbs Starting date: 02/22/2019 Today's weight: 238 lbs  Today's date: Wade Total lbs lost to date: 0    Wade  Height 5\' 6"  (1.676 m)  Weight 238 lb (108 kg)  BMI (Calculated) 38.43  BLOOD PRESSURE - SYSTOLIC 284  BLOOD PRESSURE - DIASTOLIC 86  Waist Measurement  48 inches   Body Fat % 45.5 %  Total Body Water (lbs) 90 lbs  RMR 2154   ASK: We discussed the diagnosis of obesity with Lindsay Wade today and Heer agreed to give [place] permission to discuss obesity behavioral modification therapy today.  ASSESS: Felcia has the diagnosis of obesity and her BMI today is 38.5. Alizey is in the action stage of change.   ADVISE: Charmain was educated on the multiple health risks of obesity as well as the benefit of weight loss to improve her health. She was advised of the need for long term treatment and the importance of lifestyle modifications to improve her current health and to decrease her risk of future health problems.  AGREE: Multiple dietary modification options and treatment options were discussed and  Akeela agreed to follow the recommendations documented in the above note.  ARRANGE: Terryann was educated on the importance of frequent visits to treat obesity as outlined per CMS and USPSTF guidelines and agreed to schedule her next follow up  appointment today.  Migdalia Dk, am acting  as transcriptionist for Dennard Nip, MD  I have reviewed the above documentation for accuracy and completeness, and I agree with the above. -Dennard Nip, MD

## 2019-02-24 ENCOUNTER — Encounter: Payer: Self-pay | Admitting: Gastroenterology

## 2019-02-24 ENCOUNTER — Ambulatory Visit: Payer: 59 | Admitting: Gastroenterology

## 2019-02-24 VITALS — BP 126/70 | HR 77 | Ht 66.0 in | Wt 242.0 lb

## 2019-02-24 DIAGNOSIS — K219 Gastro-esophageal reflux disease without esophagitis: Secondary | ICD-10-CM

## 2019-02-24 MED ORDER — DEXILANT 60 MG PO CPDR: 60.0000 mg | DELAYED_RELEASE_CAPSULE | Freq: Every day | ORAL | 3 refills | Status: DC

## 2019-02-24 NOTE — Progress Notes (Signed)
Referring Provider: [name]* Primary Care Physician:  [name], DO  Reason for Consultation:  Reflux, need for colon cancer screening   IMPRESSION:  Reflux on long term PPI therapy    - diagnosed in 2015    - did not respond to multiple PPIs    - requires Dexilant    - prior EGD with Dr. Collene Mares BMI 39.06 Patient reported hemorrhoids No prior colon cancer screening No known family history of colon cancer or polyps  Need for colon cancer screening.  Discussed the benefit of improved control of reflux with any weight loss.  This patient is on long-term PPI therapy.  Will refill her Dexilant after she confirms the dose.  We discussed: the need for calcium with vit. D supplements because of possible interference with absorption; potential increased risk of bone fractures, GI infections, cardiac related disease, kidney disease; c. diff infection; potential of vitamin D, B12 and magnesium malabsorption that requires chronic monitoring; potential for causing osteoporosis and the need for monitoring.  The validity of these adverse events is uncertain.  We discussed the potential risks of use of these medicines with the benefit of their use. We discussed the option of discontinuation.  There obviously is a risk in both taking the medicines as well as stopping the medicines in certain disease processes. All questions were answered to the best of my ability. I also briefly introduced the idea of TIF as an option to avoid long-term PPI therapy.   PLAN: Provide Dexilant prescription for one year when the dose is confirmed Reviewed lifestyle modifications Colonoscopy Obtain prior records from Dr. Collene Mares  Please see the "Patient Instructions" section for addition details about the plan.  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  History of Hashimotos, headaches, and OSA treated with BiPAP.  Moved from [place] in  2018. Her husband was recruited to [facility] [employer] for IT.   Longstanding history of reflux initially diagnosed in 2015. No response to omeprazole, pantoprazole, lansoprazole.  Did not respond to any therapies until she started Dexilant.  This has provided adequate relief of her symptoms.  However, she needs a refill.  She notes that her reflux is exacerbated by stress. Frequently feels like she needs a Tums. No dysphagia or odynophagia.  No sore throat, dysphonia, neck pain, cough, regurgitation, nausea, vomiting, abdominal pain, change in bowel habits, or blood in the stool.   Previously seen by Dr. Collene Mares who is no longer in her network.  Needs to establish care with a gastroenterologist to have her Dexilant refilled.  Had an EGD to evaluate the symptoms with Dr. Collene Mares.  Working with [facility] to lose weight.   History of hemorrhoids. No prior colon cancer screening.    No known family history of colon cancer or polyps. No family history of uterine/endometrial cancer, pancreatic cancer or gastric/stomach cancer.   Past Medical History:  Diagnosis Date  . Essential hypertension 04/15/2017  . GERD (gastroesophageal reflux disease)   . Headache   . Hypothyroid   . Hypothyroidism   . MRSA infection   . OSA (obstructive sleep apnea)     Past Surgical History:  Procedure Laterality Date  . BTS    . CESAREAN SECTION    . TONSILLECTOMY    . uterine ablation      Current Outpatient Medications  Medication Sig Dispense Refill  . levothyroxine (SYNTHROID) 150 MCG tablet TAKE 1 TABLET  BY MOUTH ONCE DAILY BEFORE BREAKFAST 90 tablet 4  . lisinopril (ZESTRIL) 20 MG tablet TAKE 1 TABLET BY MOUTH DAILY. 90 tablet 1  . DEXILANT 60 MG capsule      No current facility-administered medications for this visit.     Allergies as of 02/24/2019  . (No Known Allergies)    Family History  Problem Relation Age of Onset  . Hypertension Mother   . Diabetes Mother        TYPE 2   .  Hyperlipidemia Mother   . Thyroid disease Mother   . Hypertension Father   . Parkinsonism Father   . Hyperlipidemia Father   . Stroke Father   . Hyperlipidemia Sister   . Colon cancer Neg Hx   . Stomach cancer Neg Hx   . Pancreatic cancer Neg Hx     Social History   Socioeconomic History  . Marital status: Married    Spouse name: Broadus John  . Number of children: 2  . Years of education: 32  . Highest education level: High school graduate  Occupational History  . Occupation: Education officer, museum    Employer: [employer]  . Financial resource strain: Not on file  . Food insecurity    Worry: Not on file    Inability: Not on file  . Transportation needs    Medical: Not on file    Non-medical: Not on file  Tobacco Use  . Smoking status: Never Smoker  . Smokeless tobacco: Never Used  Substance and Sexual Activity  . Alcohol use: Yes    Comment: social drinker; very rare   . Drug use: No  . Sexual activity: Yes    Partners: Male    Birth control/protection: Surgical  Lifestyle  . Physical activity    Days per week: Not on file    Minutes per session: Not on file  . Stress: Not on file  Relationships  . Social Herbalist on phone: Not on file    Gets together: Not on file    Attends religious service: Not on file    Active member of club or organization: Not on file    Attends meetings of clubs or organizations: Not on file    Relationship status: Not on file  . Intimate partner violence    Fear of current or ex partner: Not on file    Emotionally abused: Not on file    Physically abused: Not on file    Forced sexual activity: Not on file  Other Topics Concern  . Not on file  Social History Narrative   Lives at home with her husband and 2 sons   Right handed   3-4 cups of caffeine daily    Review of Systems: 12 system ROS is negative except as noted above.   Physical Exam: General:   Alert,  well-nourished, pleasant and  cooperative in NAD Head:  Normocephalic and atraumatic. Eyes:  Sclera clear, no icterus.   Conjunctiva pink. Ears:  Normal auditory acuity. Nose:  No deformity, discharge,  or lesions. Mouth:  No deformity or lesions.   Neck:  Supple; no masses or thyromegaly. Lungs:  Clear throughout to auscultation.   No wheezes. Heart:  Regular rate and rhythm; no murmurs. Abdomen:  Soft,nontender, nondistended, central obesity, normal bowel sounds, no rebound or guarding. No hepatosplenomegaly.   Rectal:  Deferred  Msk:  Symmetrical. No boney deformities LAD: No inguinal or umbilical LAD Extremities:  No clubbing or edema. Neurologic:  Alert and  oriented x4;  grossly nonfocal Skin:  Intact without significant lesions or rashes. Psych:  Alert and cooperative. Normal mood and affect.   Lab Results: Recent Labs    02/22/19 0956  WBC 5.4  HGB 13.4  HCT 41.3   BMET Recent Labs    02/22/19 0956  NA 140  K 4.5  CL 101  CO2 24  GLUCOSE 90  BUN 17  CREATININE 0.88  CALCIUM 9.5   LFT Recent Labs    02/22/19 0956  PROT 6.9  ALBUMIN 4.8  AST 14  ALT 14  ALKPHOS 82  BILITOT 0.4     Anthonie Lotito L. Tarri Glenn, MD, MPH 02/24/2019, 10:48 AM

## 2019-02-24 NOTE — Patient Instructions (Addendum)
We will work to get your Dexilant refilled. Please let me know if you are having any additional reflux despite treatment.  Take your Dexilant 30 minutes before meals. Avoid any dietary triggers. Avoid spicy and acidic foods. Limit your intake of coffee, tea, alcohol, and carbonated drinks. Work to maintain a healthy weight.  Keep the head of the bed elevated with blocks if you are having any nighttime symptoms. Stay upright for 2 hours after eating. Avoid meals and snacks three to four hours before bedtime.  I recommend that you take daily calcium and vitamin D while you are using Dexilant over the years.   Try peppermint oil or an Altoid to try to improve your gas.   It is time for a colonoscopy to screen for colon cancer screening.  Tips for colonoscopy:  - Stay well hydrated for 3-4 days prior to the exam. This reduces nausea and dehydration.  - To prevent skin/hemorrhoid irritation - prior to wiping, put A&Dointment or vaseline on the toilet paper. - Keep a towel or pad on the bed.  - Drink  64oz of clear liquids in the morning of prep day (prior to starting the prep) to be sure that there is enough fluid to flush the colon and stay hydrated!!!! This is in addition to the fluids required for preparation. - Use of a flavored hard candy, such as grape Anise Salvo, can counteract some of the flavor of the prep and may prevent some nausea.

## 2019-02-28 ENCOUNTER — Ambulatory Visit (INDEPENDENT_AMBULATORY_CARE_PROVIDER_SITE_OTHER): Payer: 59 | Admitting: Psychology

## 2019-03-02 ENCOUNTER — Other Ambulatory Visit: Payer: Self-pay

## 2019-03-04 ENCOUNTER — Ambulatory Visit (INDEPENDENT_AMBULATORY_CARE_PROVIDER_SITE_OTHER): Payer: 59 | Admitting: Family Medicine

## 2019-03-04 ENCOUNTER — Other Ambulatory Visit: Payer: Self-pay

## 2019-03-04 ENCOUNTER — Encounter: Payer: Self-pay | Admitting: Family Medicine

## 2019-03-04 VITALS — BP 128/86 | HR 85 | Temp 97.9°F | Ht 66.0 in | Wt 238.5 lb

## 2019-03-04 DIAGNOSIS — Z0000 Encounter for general adult medical examination without abnormal findings: Secondary | ICD-10-CM

## 2019-03-04 DIAGNOSIS — Z1211 Encounter for screening for malignant neoplasm of colon: Secondary | ICD-10-CM | POA: Diagnosis not present

## 2019-03-04 DIAGNOSIS — Z1239 Encounter for other screening for malignant neoplasm of breast: Secondary | ICD-10-CM | POA: Diagnosis not present

## 2019-03-04 NOTE — Progress Notes (Signed)
Chief Complaint  Patient presents with  . Annual Exam     Well Woman Lindsay Wade is here for a complete physical.   Her last physical was >1 year ago.  Current diet: in general, a "healthy" diet. Current exercise: walk. Weight is stable and she denies daytime fatigue. No LMP recorded. Patient has had an ablation. Seatbelt? Yes  Health Maintenance Pap/HPV- Yes Mammogram- No Tetanus- Yes HIV screening- Yes  Past Medical History:  Diagnosis Date  . Essential hypertension 04/15/2017  . GERD (gastroesophageal reflux disease)   . Headache   . Hypothyroid   . Hypothyroidism   . MRSA infection   . OSA (obstructive sleep apnea)      Past Surgical History:  Procedure Laterality Date  . BTS    . CESAREAN SECTION    . TONSILLECTOMY    . uterine ablation      Medications  Current Outpatient Medications on File Prior to Visit  Medication Sig Dispense Refill  . DEXILANT 60 MG capsule Take 1 capsule (60 mg total) by mouth daily. 30 capsule 3  . levothyroxine (SYNTHROID) 150 MCG tablet TAKE 1 TABLET BY MOUTH ONCE DAILY BEFORE BREAKFAST 90 tablet 4  . lisinopril (ZESTRIL) 20 MG tablet TAKE 1 TABLET BY MOUTH DAILY. 90 tablet 1   Allergies No Known Allergies  Review of Systems: Constitutional:  no unexpected weight changes Eye:  no recent significant change in vision Ear/Nose/Mouth/Throat:  Ears:  no tinnitus or vertigo and no recent change in hearing Nose/Mouth/Throat:  no complaints of nasal congestion, no sore throat Cardiovascular: no chest pain Respiratory:  no cough and no shortness of breath Gastrointestinal:  no abdominal pain, no change in bowel habits GU:  Female: negative for dysuria or pelvic pain Musculoskeletal/Extremities:  no pain of the joints Integumentary (Skin/Breast):  no abnormal skin lesions reported Neurologic:  no headaches Endocrine:  denies fatigue Hematologic/Lymphatic:  No areas of easy bleeding  Exam BP 128/86 (BP Location: Left Arm,  Patient Position: Sitting, Cuff Size: Large)   Pulse 85   Temp 97.9 F (36.6 C) (Oral)   Ht 5\' 6"  (1.676 m)   Wt 238 lb 8 oz (108.2 kg)   SpO2 98%   BMI 38.49 kg/m  General:  well developed, well nourished, in no apparent distress Skin:  no significant moles, warts, or growths Head:  no masses, lesions, or tenderness Eyes:  pupils equal and round, sclera anicteric without injection Ears:  canals without lesions, TMs shiny without retraction, no obvious effusion, no erythema Nose:  nares patent, septum midline, mucosa normal, and no drainage or sinus tenderness Throat/Pharynx:  lips and gingiva without lesion; tongue and uvula midline; non-inflamed pharynx; no exudates or postnasal drainage Neck: neck supple without adenopathy, thyromegaly, or masses Lungs:  clear to auscultation, breath sounds equal bilaterally, no respiratory distress Cardio:  regular rate and rhythm, no bruits, no LE edema Abdomen:  abdomen soft, nontender; bowel sounds normal; no masses or organomegaly Genital: Defer to GYN Musculoskeletal:  symmetrical muscle groups noted without atrophy or deformity Extremities:  no clubbing, cyanosis, or edema, no deformities, no skin discoloration Neuro:  gait normal; deep tendon reflexes normal and symmetric Psych: well oriented with normal range of affect and appropriate judgment/insight  Assessment and Plan  Well adult exam  Screen for colon cancer  Screening for breast cancer - Plan: MM DIGITAL SCREENING BILATERAL   Well 53 y.o. female. Counseled on diet and exercise. Other orders as above. Follow up in 6 mo  or prn. The patient voiced understanding and agreement to the plan.  [place], DO 03/04/19 10:08 AM

## 2019-03-04 NOTE — Patient Instructions (Signed)
The new Shingrix vaccine (for shingles) is a 2 shot series. It can make people feel low energy, achy and almost like they have the flu for 48 hours after injection. Please plan accordingly when deciding on when to get this shot. Call our office for a nurse visit appointment to get this. The second shot of the series is less severe regarding the side effects, but it still lasts 48 hours.   I recommend getting the flu shot in mid October. This suggestion would change if the CDC comes out with a different recommendation.   Keep the diet clean and stay active.  Let us know if you need anything.

## 2019-03-08 ENCOUNTER — Encounter (INDEPENDENT_AMBULATORY_CARE_PROVIDER_SITE_OTHER): Payer: Self-pay | Admitting: Family Medicine

## 2019-03-08 ENCOUNTER — Other Ambulatory Visit: Payer: Self-pay

## 2019-03-08 ENCOUNTER — Ambulatory Visit (INDEPENDENT_AMBULATORY_CARE_PROVIDER_SITE_OTHER): Payer: 59 | Admitting: Family Medicine

## 2019-03-08 VITALS — BP 132/88 | HR 73 | Temp 98.1°F | Ht 66.0 in | Wt 233.0 lb

## 2019-03-08 DIAGNOSIS — Z9189 Other specified personal risk factors, not elsewhere classified: Secondary | ICD-10-CM

## 2019-03-08 DIAGNOSIS — E8881 Metabolic syndrome: Secondary | ICD-10-CM

## 2019-03-08 DIAGNOSIS — Z6837 Body mass index (BMI) 37.0-37.9, adult: Secondary | ICD-10-CM

## 2019-03-08 DIAGNOSIS — E7849 Other hyperlipidemia: Secondary | ICD-10-CM | POA: Diagnosis not present

## 2019-03-08 DIAGNOSIS — E559 Vitamin D deficiency, unspecified: Secondary | ICD-10-CM

## 2019-03-08 MED ORDER — VITAMIN D (ERGOCALCIFEROL) 1.25 MG (50000 UNIT) PO CAPS: 50000.0000 [IU] | ORAL_CAPSULE | ORAL | 0 refills | Status: DC

## 2019-03-08 MED FILL — VIT D2 1.25 MG (50,000 UNIT: 1.25 MG | 28 days supply | Qty: 4 | Fill #0

## 2019-03-08 NOTE — Progress Notes (Signed)
Office: [phone]  /  Fax: [phone]   HPI:   Chief Complaint: OBESITY Lindsay Wade is here to discuss her progress with her obesity treatment plan. She is on the Category 3 plan and is following her eating plan approximately 90 % of the time. She states she is exercising 0 minutes 0 times per week. Lindsay Wade did well with weight loss but struggled to follow her plan closely, especially on days she worked. She requests more freedom with her eating plan.  Her weight is 233 lb (105.7 kg) today and has had a weight loss of 5 pounds over a period of 2 weeks since her last visit. She has lost 5 lbs since starting treatment with [place].  Hyperlipidemia Lindsay Wade has hyperlipidemia and last LDL was elevated. She denies any chest pain, claudication or myalgias. She is not on statin, and would like to try to improve her cholesterol levels with intensive lifestyle modification including a low saturated fat diet, exercise and weight loss.   Vitamin D Deficiency Lindsay Wade has a diagnosis of vitamin D deficiency. She notes fatigue and is not currently taking Vit D. She denies nausea, vomiting or muscle weakness.  Insulin Resistance Lindsay Wade has a new diagnosis of insulin resistance based on her elevated fasting insulin level >5. Her glucose and A1c are within normal limits, but fasting insulin is elevated. Although [name]'s blood glucose readings are still under good control, insulin resistance puts her at greater risk of metabolic syndrome and diabetes. She is not taking metformin currently and continues to work on diet and exercise to decrease risk of diabetes. She notes polyphagia.  At risk for diabetes Lindsay Wade is at higher than average risk for developing diabetes due to her obesity and insulin resistance. She currently denies polyuria or polydipsia.  ASSESSMENT AND PLAN:  Vitamin D deficiency - Plan: Vitamin D, Ergocalciferol, (DRISDOL) 1.25 MG (50000 UT) CAPS capsule  Other hyperlipidemia  Insulin  resistance  At risk for diabetes mellitus  Class 2 severe obesity with serious comorbidity and body mass index (BMI) of 37.0 to 37.9 in adult, unspecified obesity type (Lindsay Wade)  PLAN:  Hyperlipidemia Lindsay Wade was informed of the American Heart Association Guidelines emphasizing intensive lifestyle modifications as the first line treatment for hyperlipidemia. We discussed many lifestyle modifications today in depth, and [name] will continue to work on decreasing saturated fats such as fatty red meat, butter and many fried foods. She will also increase vegetables and lean protein in her diet and continue to work on diet, exercise, and weight loss efforts.  Vitamin D Deficiency Lindsay Wade was informed that low vitamin D levels contributes to fatigue and are associated with obesity, breast, and colon cancer. [name] agrees to start prescription Vit D 50,000 IU every week #4 with no refills. She will follow up for routine testing of vitamin D, at least 2-3 times per year. She was informed of the risk of over-replacement of vitamin D and agrees to not increase her dose unless she discusses this with [place] first. Lindsay Wade agrees to follow up with our clinic in 2 weeks.  Insulin Resistance Lindsay Wade will continue to work on diet, exercise, and weight loss, and decreasing simple carbohydrates in her diet to help decrease the risk of diabetes. She was educated about glycemic index. We dicussed metformin including benefits and risks. She was informed that eating too many simple carbohydrates or too many calories at one sitting increases the likelihood of GI side effects. Lindsay Wade declined metformin for now and prescription was not written today. Lindsay Wade  agrees to follow up with our clinic in 2 weeks as directed to monitor her progress.  Diabetes risk counseling [name] was given extended (30 minutes) diabetes prevention counseling today. She is 53 y.o. female and has risk factors for diabetes including obesity and insulin  resistance. We discussed intensive lifestyle modifications today with an emphasis on weight loss as well as increasing exercise and decreasing simple carbohydrates in her diet.  Obesity Lindsay Wade is currently in the action stage of change. As such, her goal is to continue with weight loss efforts She has agreed to change to keep a food journal with 1400-1600 calories and 90 grams of protein daily Lindsay Wade has been instructed to work up to a goal of 150 minutes of combined cardio and strengthening exercise per week for weight loss and overall health benefits. We discussed the following Behavioral Modification Strategies today: increasing lean protein intake and decreasing simple carbohydrates    Lindsay Wade has agreed to follow up with our clinic in 2 weeks. She was informed of the importance of frequent follow up visits to maximize her success with intensive lifestyle modifications for her multiple health conditions.  ALLERGIES: No Known Allergies  MEDICATIONS: Current Outpatient Medications on File Prior to Visit  Medication Sig Dispense Refill  . DEXILANT 60 MG capsule Take 1 capsule (60 mg total) by mouth daily. 30 capsule 3  . levothyroxine (SYNTHROID) 150 MCG tablet TAKE 1 TABLET BY MOUTH ONCE DAILY BEFORE BREAKFAST 90 tablet 4  . lisinopril (ZESTRIL) 20 MG tablet TAKE 1 TABLET BY MOUTH DAILY. 90 tablet 1   No current facility-administered medications on file prior to visit.     PAST MEDICAL HISTORY: Past Medical History:  Diagnosis Date  . Essential hypertension 04/15/2017  . GERD (gastroesophageal reflux disease)   . Headache   . Hypothyroid   . Hypothyroidism   . MRSA infection   . OSA (obstructive sleep apnea)     PAST SURGICAL HISTORY: Past Surgical History:  Procedure Laterality Date  . BTS    . CESAREAN SECTION    . TONSILLECTOMY    . uterine ablation      SOCIAL HISTORY: Social History   Tobacco Use  . Smoking status: Never Smoker  . Smokeless tobacco: Never  Used  Substance Use Topics  . Alcohol use: Yes    Comment: social drinker; very rare   . Drug use: No    FAMILY HISTORY: Family History  Problem Relation Age of Onset  . Hypertension Mother   . Diabetes Mother        TYPE 2   . Hyperlipidemia Mother   . Thyroid disease Mother   . Hypertension Father   . Parkinsonism Father   . Hyperlipidemia Father   . Stroke Father   . Hyperlipidemia Sister   . Colon cancer Neg Hx   . Stomach cancer Neg Hx   . Pancreatic cancer Neg Hx     ROS: Review of Systems  Constitutional: Positive for malaise/fatigue and weight loss.  Cardiovascular: Negative for chest pain and claudication.  Gastrointestinal: Negative for nausea and vomiting.  Genitourinary: Negative for frequency.  Musculoskeletal: Negative for myalgias.       Negative muscle weakness  Endo/Heme/Allergies: Negative for polydipsia.       Positive polyphagia    PHYSICAL EXAM: Blood pressure 132/88, pulse 73, temperature 98.1 F (36.7 C), temperature source Oral, height 5\' 6"  (1.676 m), weight 233 lb (105.7 kg), SpO2 99 %. Body mass index is 37.61 kg/m. Physical Exam  Vitals signs reviewed.  Constitutional:      Appearance: Normal appearance. She is obese.  Cardiovascular:     Rate and Rhythm: Normal rate.     Pulses: Normal pulses.  Pulmonary:     Effort: Pulmonary effort is normal.     Breath sounds: Normal breath sounds.  Musculoskeletal: Normal range of motion.  Skin:    General: Skin is warm and dry.  Neurological:     Mental Status: She is alert and oriented to person, place, and time.  Psychiatric:        Mood and Affect: Mood normal.        Behavior: Behavior normal.     RECENT LABS AND TESTS: BMET    Component Value Date/Time   NA 140 02/22/2019 0956   K 4.5 02/22/2019 0956   CL 101 02/22/2019 0956   CO2 24 02/22/2019 0956   GLUCOSE 90 02/22/2019 0956   GLUCOSE 96 12/17/2017 0737   BUN 17 02/22/2019 0956   CREATININE 0.88 02/22/2019 0956    CALCIUM 9.5 02/22/2019 0956   GFRNONAA 77 02/22/2019 0956   GFRAA 89 02/22/2019 0956   Lab Results  Component Value Date   HGBA1C 5.5 02/22/2019   Lab Results  Component Value Date   INSULIN 11.9 02/22/2019   CBC    Component Value Date/Time   WBC 5.4 02/22/2019 0956   WBC 6.1 12/17/2017 0737   RBC 4.70 02/22/2019 0956   RBC 4.24 12/17/2017 0737   HGB 13.4 02/22/2019 0956   HCT 41.3 02/22/2019 0956   PLT 265.0 12/17/2017 0737   MCV 88 02/22/2019 0956   MCH 28.5 02/22/2019 0956   MCHC 32.4 02/22/2019 0956   MCHC 33.3 12/17/2017 0737   RDW 13.0 02/22/2019 0956   LYMPHSABS 2.2 02/22/2019 0956   EOSABS 0.0 02/22/2019 0956   BASOSABS 0.0 02/22/2019 0956   Iron/TIBC/Ferritin/ %Sat No results found for: IRON, TIBC, FERRITIN, IRONPCTSAT Lipid Panel     Component Value Date/Time   CHOL 235 (H) 02/22/2019 0956   TRIG 112 02/22/2019 0956   HDL 72 02/22/2019 0956   CHOLHDL 4 12/17/2017 0737   VLDL 36.8 12/17/2017 0737   LDLCALC 141 (H) 02/22/2019 0956   Hepatic Function Panel     Component Value Date/Time   PROT 6.9 02/22/2019 0956   ALBUMIN 4.8 02/22/2019 0956   AST 14 02/22/2019 0956   ALT 14 02/22/2019 0956   ALKPHOS 82 02/22/2019 0956   BILITOT 0.4 02/22/2019 0956      Component Value Date/Time   TSH 0.568 02/22/2019 0956   TSH 5.35 (H) 12/17/2017 0737   TSH 0.47 02/18/2017 1346      OBESITY BEHAVIORAL INTERVENTION VISIT  Today's visit was # 2   Starting weight: 238 lbs Starting date: 02/22/2019 Today's weight : 233 lbs Today's date: 03/08/2019 Total lbs lost to date: 5    ASK: We discussed the diagnosis of obesity with Lindsay Wade today and Lindsay Wade agreed to give [place] permission to discuss obesity behavioral modification therapy today.  ASSESS: Lindsay Wade has the diagnosis of obesity and her BMI today is 37.63 Lindsay Wade is in the action stage of change   ADVISE: Lindsay Wade was educated on the multiple health risks of obesity as well as the benefit  of weight loss to improve her health. She was advised of the need for long term treatment and the importance of lifestyle modifications to improve her current health and to decrease her risk of future health problems.  AGREE: Multiple dietary modification options and treatment options were discussed and  Lindsay Wade agreed to follow the recommendations documented in the above note.  ARRANGE: Lindsay Wade was educated on the importance of frequent visits to treat obesity as outlined per CMS and USPSTF guidelines and agreed to schedule her next follow up appointment today.  I, Lindsay Dredge, am acting as transcriptionist for Dennard Nip, MD  I have reviewed the above documentation for accuracy and completeness, and I agree with the above. -Dennard Nip, MD

## 2019-03-16 MED FILL — DEXILANT DR 60 MG CAPSULE: 60 | 30 days supply | Qty: 30 | Fill #0

## 2019-03-22 ENCOUNTER — Encounter (INDEPENDENT_AMBULATORY_CARE_PROVIDER_SITE_OTHER): Payer: Self-pay | Admitting: Family Medicine

## 2019-03-22 ENCOUNTER — Other Ambulatory Visit: Payer: Self-pay

## 2019-03-22 ENCOUNTER — Ambulatory Visit (INDEPENDENT_AMBULATORY_CARE_PROVIDER_SITE_OTHER): Payer: 59 | Admitting: Family Medicine

## 2019-03-22 VITALS — BP 138/85 | HR 63 | Temp 97.9°F | Ht 66.0 in | Wt 231.0 lb

## 2019-03-22 DIAGNOSIS — E559 Vitamin D deficiency, unspecified: Secondary | ICD-10-CM | POA: Diagnosis not present

## 2019-03-22 DIAGNOSIS — Z6837 Body mass index (BMI) 37.0-37.9, adult: Secondary | ICD-10-CM | POA: Diagnosis not present

## 2019-03-23 NOTE — Progress Notes (Signed)
Office: [phone]  /  Fax: [phone]   HPI:   Chief Complaint: OBESITY Lindsay Wade is here to discuss her progress with her obesity treatment plan. Lindsay Wade is on the keep a food journal with 1400-1600 calories and 90 grams of protein daily and is following her eating plan approximately 100 % of the time. Lindsay Wade states Lindsay Wade is walking 3,000-4,000 steps 7 times per week. Lindsay Wade is enjoying keeping a food journal and has done well with weight loss. Lindsay Wade has questions about where her macro's should be. Lindsay Wade is doing well meeting her calorie goals, but her protein is still lower than ideal.  Her weight is 231 lb (104.8 kg) today and has had a weight loss of 2 pounds over a period of 2 weeks since her last visit. Lindsay Wade has lost 7 lbs since starting treatment with [place].  Vitamin D Deficiency Lindsay Wade has a diagnosis of vitamin D deficiency. Lindsay Wade has started prescription Vit D, but level is not yet at goal. Lindsay Wade denies nausea, vomiting or muscle weakness.  ASSESSMENT AND PLAN:  Vitamin D deficiency  Class 2 severe obesity with serious comorbidity and body mass index (BMI) of 37.0 to 37.9 in adult, unspecified obesity type ([place])  PLAN:  Vitamin D Deficiency Lindsay Wade was informed that low vitamin D levels contributes to fatigue and are associated with obesity, breast, and colon cancer. Lindsay Wade agrees to continue taking prescription Vit D 50,000 IU every week and will follow up for routine testing of vitamin D, at least 2-3 times per year. Lindsay Wade was informed of the risk of over-replacement of vitamin D and agrees to not increase her dose unless Lindsay Wade discusses this with [place] first. We will recheck labs in 2 months. Lindsay Wade agrees to follow up with our clinic in 2 weeks.  I spent > than 50% of the 15 minute visit on counseling as documented in the note.  Obesity Lindsay Wade is currently in the action stage of change. As such, her goal is to continue with weight loss efforts Lindsay Wade has agreed to keep a food journal with  1400-1600 calories and 90+ grams of protein daily Lindsay Wade has been instructed to work up to a goal of 150 minutes of combined cardio and strengthening exercise per week for weight loss and overall health benefits. We discussed the following Behavioral Modification Strategies today: increasing lean protein intake and decreasing simple carbohydrates    Lindsay Wade has agreed to follow up with our clinic in 2 weeks. Lindsay Wade was informed of the importance of frequent follow up visits to maximize her success with intensive lifestyle modifications for her multiple health conditions.  ALLERGIES: No Known Allergies  MEDICATIONS: Current Outpatient Medications on File Prior to Visit  Medication Sig Dispense Refill  . levothyroxine (SYNTHROID) 150 MCG tablet TAKE 1 TABLET BY MOUTH ONCE DAILY BEFORE BREAKFAST 90 tablet 4  . lisinopril (ZESTRIL) 20 MG tablet TAKE 1 TABLET BY MOUTH DAILY. 90 tablet 1  . Vitamin D, Ergocalciferol, (DRISDOL) 1.25 MG (50000 UT) CAPS capsule Take 1 capsule (50,000 Units total) by mouth every 7 (seven) days. 4 capsule 0   No current facility-administered medications on file prior to visit.     PAST MEDICAL HISTORY: Past Medical History:  Diagnosis Date  . Essential hypertension 04/15/2017  . GERD (gastroesophageal reflux disease)   . Headache   . Hypothyroid   . Hypothyroidism   . MRSA infection   . OSA (obstructive sleep apnea)     PAST SURGICAL HISTORY: Past Surgical History:  Procedure Laterality  Date  . BTS    . CESAREAN SECTION    . TONSILLECTOMY    . uterine ablation      SOCIAL HISTORY: Social History   Tobacco Use  . Smoking status: Never Smoker  . Smokeless tobacco: Never Used  Substance Use Topics  . Alcohol use: Yes    Comment: social drinker; very rare   . Drug use: No    FAMILY HISTORY: Family History  Problem Relation Age of Onset  . Hypertension Mother   . Diabetes Mother        TYPE 2   . Hyperlipidemia Mother   . Thyroid disease  Mother   . Hypertension Father   . Parkinsonism Father   . Hyperlipidemia Father   . Stroke Father   . Hyperlipidemia Sister   . Colon cancer Neg Hx   . Stomach cancer Neg Hx   . Pancreatic cancer Neg Hx     ROS: Review of Systems  Constitutional: Positive for weight loss.  Gastrointestinal: Negative for nausea and vomiting.  Musculoskeletal:       Negative muscle weakness    PHYSICAL EXAM: Blood pressure 138/85, pulse 63, temperature 97.9 F (36.6 C), temperature source Oral, height 5\' 6"  (1.676 m), weight 231 lb (104.8 kg), SpO2 98 %. Body mass index is 37.28 kg/m. Physical Exam Vitals signs reviewed.  Constitutional:      Appearance: Normal appearance. Lindsay Wade is obese.  Cardiovascular:     Rate and Rhythm: Normal rate.     Pulses: Normal pulses.  Pulmonary:     Effort: Pulmonary effort is normal.     Breath sounds: Normal breath sounds.  Musculoskeletal: Normal range of motion.  Skin:    General: Skin is warm and dry.  Neurological:     Mental Status: Lindsay Wade is alert and oriented to person, place, and time.  Psychiatric:        Mood and Affect: Mood normal.        Behavior: Behavior normal.     RECENT LABS AND TESTS: BMET    Component Value Date/Time   NA 140 02/22/2019 0956   K 4.5 02/22/2019 0956   CL 101 02/22/2019 0956   CO2 24 02/22/2019 0956   GLUCOSE 90 02/22/2019 0956   GLUCOSE 96 12/17/2017 0737   BUN 17 02/22/2019 0956   CREATININE 0.88 02/22/2019 0956   CALCIUM 9.5 02/22/2019 0956   GFRNONAA 77 02/22/2019 0956   GFRAA 89 02/22/2019 0956   Lab Results  Component Value Date   HGBA1C 5.5 02/22/2019   Lab Results  Component Value Date   INSULIN 11.9 02/22/2019   CBC    Component Value Date/Time   WBC 5.4 02/22/2019 0956   WBC 6.1 12/17/2017 0737   RBC 4.70 02/22/2019 0956   RBC 4.24 12/17/2017 0737   HGB 13.4 02/22/2019 0956   HCT 41.3 02/22/2019 0956   PLT 265.0 12/17/2017 0737   MCV 88 02/22/2019 0956   MCH 28.5 02/22/2019 0956    MCHC 32.4 02/22/2019 0956   MCHC 33.3 12/17/2017 0737   RDW 13.0 02/22/2019 0956   LYMPHSABS 2.2 02/22/2019 0956   EOSABS 0.0 02/22/2019 0956   BASOSABS 0.0 02/22/2019 0956   Iron/TIBC/Ferritin/ %Sat No results found for: IRON, TIBC, FERRITIN, IRONPCTSAT Lipid Panel     Component Value Date/Time   CHOL 235 (H) 02/22/2019 0956   TRIG 112 02/22/2019 0956   HDL 72 02/22/2019 0956   CHOLHDL 4 12/17/2017 0737   VLDL 36.8 12/17/2017  0737   LDLCALC 141 (H) 02/22/2019 0956   Hepatic Function Panel     Component Value Date/Time   PROT 6.9 02/22/2019 0956   ALBUMIN 4.8 02/22/2019 0956   AST 14 02/22/2019 0956   ALT 14 02/22/2019 0956   ALKPHOS 82 02/22/2019 0956   BILITOT 0.4 02/22/2019 0956      Component Value Date/Time   TSH 0.568 02/22/2019 0956   TSH 5.35 (H) 12/17/2017 0737   TSH 0.47 02/18/2017 1346      OBESITY BEHAVIORAL INTERVENTION VISIT  Today's visit was # 3   Starting weight: 238 lbs Starting date: 02/22/2019 Today's weight : 231 lbs Today's date: 03/22/2019 Total lbs lost to date: 7    ASK: We discussed the diagnosis of obesity with Lindsay Wade today and Lindsay Wade agreed to give [place] permission to discuss obesity behavioral modification therapy today.  ASSESS: Lindsay Wade has the diagnosis of obesity and her BMI today is 33.3 Lindsay Wade is in the action stage of change   ADVISE: Lindsay Wade was educated on the multiple health risks of obesity as well as the benefit of weight loss to improve her health. Lindsay Wade was advised of the need for long term treatment and the importance of lifestyle modifications to improve her current health and to decrease her risk of future health problems.  AGREE: Multiple dietary modification options and treatment options were discussed and  Lindsay Wade agreed to follow the recommendations documented in the above note.  ARRANGE: Lindsay Wade was educated on the importance of frequent visits to treat obesity as outlined per CMS and USPSTF  guidelines and agreed to schedule her next follow up appointment today.  I, Trixie Dredge, am acting as transcriptionist for Dennard Nip, MD  I have reviewed the above documentation for accuracy and completeness, and I agree with the above. -Dennard Nip, MD

## 2019-04-07 ENCOUNTER — Encounter: Payer: Self-pay | Admitting: Family Medicine

## 2019-04-11 ENCOUNTER — Encounter (INDEPENDENT_AMBULATORY_CARE_PROVIDER_SITE_OTHER): Payer: Self-pay

## 2019-04-11 ENCOUNTER — Other Ambulatory Visit: Payer: Self-pay

## 2019-04-11 ENCOUNTER — Ambulatory Visit (INDEPENDENT_AMBULATORY_CARE_PROVIDER_SITE_OTHER): Payer: 59 | Admitting: Family Medicine

## 2019-04-20 ENCOUNTER — Ambulatory Visit (INDEPENDENT_AMBULATORY_CARE_PROVIDER_SITE_OTHER): Payer: 59 | Admitting: *Deleted

## 2019-04-20 ENCOUNTER — Other Ambulatory Visit: Payer: Self-pay

## 2019-04-20 DIAGNOSIS — Z23 Encounter for immunization: Secondary | ICD-10-CM | POA: Diagnosis not present

## 2019-04-20 NOTE — Progress Notes (Signed)
Patient here for flu vaccine.  Vaccine given in left deltoid and patient tolerated well. 

## 2019-04-25 ENCOUNTER — Encounter (INDEPENDENT_AMBULATORY_CARE_PROVIDER_SITE_OTHER): Payer: Self-pay | Admitting: Physician Assistant

## 2019-04-25 ENCOUNTER — Ambulatory Visit (INDEPENDENT_AMBULATORY_CARE_PROVIDER_SITE_OTHER): Payer: 59 | Admitting: Physician Assistant

## 2019-04-25 ENCOUNTER — Other Ambulatory Visit: Payer: Self-pay

## 2019-04-25 VITALS — BP 139/81 | HR 77 | Temp 98.3°F | Ht 66.0 in | Wt 241.0 lb

## 2019-04-25 DIAGNOSIS — E7849 Other hyperlipidemia: Secondary | ICD-10-CM | POA: Diagnosis not present

## 2019-04-25 DIAGNOSIS — E559 Vitamin D deficiency, unspecified: Secondary | ICD-10-CM

## 2019-04-25 DIAGNOSIS — Z9189 Other specified personal risk factors, not elsewhere classified: Secondary | ICD-10-CM

## 2019-04-25 DIAGNOSIS — Z6839 Body mass index (BMI) 39.0-39.9, adult: Secondary | ICD-10-CM | POA: Diagnosis not present

## 2019-04-25 MED ORDER — VITAMIN D (ERGOCALCIFEROL) 1.25 MG (50000 UNIT) PO CAPS: 50000.0000 [IU] | ORAL_CAPSULE | ORAL | 0 refills | Status: DC

## 2019-04-27 NOTE — Progress Notes (Signed)
Office: [phone]  /  Fax: [phone]   HPI:   Chief Complaint: OBESITY Lindsay Wade is here to discuss her progress with her obesity treatment plan. She is on the keep a food journal with 1400 to 1600 calories and 90 grams of protein daily plan and is following her eating plan approximately 50 % of the time. She states she is exercising 0 minutes 0 times per week. Smt reports that she has not been eating according to her calorie and protein goals. She is ready to restart. Her weight is 241 lb (109.3 kg) today and has had a weight gain of 10 pounds over a period of 5 weeks since her last visit. She has gained 3 lbs since starting treatment with [place].  Vitamin D deficiency Lindsay Wade has a diagnosis of vitamin D deficiency. Lindsay Wade is on vit D and she denies nausea, vomiting or muscle weakness.  Hyperlipidemia Lindsay Wade has hyperlipidemia and she is not on medications. She has been trying to improve her cholesterol levels with intensive lifestyle modification including a low saturated fat diet, exercise and weight loss. She denies any chest pain.  At risk for cardiovascular disease Lindsay Wade is at a higher than average risk for cardiovascular disease due to obesity and hyperlipidemia. She currently denies any chest pain.  ASSESSMENT AND PLAN:  Vitamin D deficiency - Plan: Vitamin D, Ergocalciferol, (DRISDOL) 1.25 MG (50000 UT) CAPS capsule  Other hyperlipidemia  At risk for heart disease  Class 2 severe obesity with serious comorbidity and body mass index (BMI) of 39.0 to 39.9 in adult, unspecified obesity type ([place])  PLAN:  Vitamin D Deficiency Lindsay Wade was informed that low vitamin D levels contributes to fatigue and are associated with obesity, breast, and colon cancer. Lindsay Wade agrees to continue to take prescription Vit D @50 ,000 IU every week #4 with no refills and she will follow up for routine testing of vitamin D, at least 2-3 times per year. She was informed of the risk of  over-replacement of vitamin D and agrees to not increase her dose unless she discusses this with [place] first. Lindsay Wade agrees to follow up with our clinic in 2 weeks.  Hyperlipidemia Lindsay Wade was informed of the American Heart Association Guidelines emphasizing intensive lifestyle modifications as the first line treatment for hyperlipidemia. We discussed many lifestyle modifications today in depth, and Lindsay Wade will continue to work on decreasing saturated fats such as fatty red meat, butter and many fried foods. She will also increase vegetables and lean protein in her diet and continue to work on exercise and weight loss efforts.   Cardiovascular risk counseling Lindsay Wade was given extended (15 minutes) coronary artery disease prevention counseling today. She is 53 y.o. female and has risk factors for heart disease including obesity and hyperlipidemia. We discussed intensive lifestyle modifications today with an emphasis on specific weight loss instructions and strategies. Pt was also informed of the importance of increasing exercise and decreasing saturated fats to help prevent heart disease.  Obesity Lindsay Wade is currently in the action stage of change. As such, her goal is to continue with weight loss efforts She has agreed to keep a food journal with 1400 to 1600 calories and 95 grams of protein  Lindsay Wade has been instructed to work up to a goal of 150 minutes of combined cardio and strengthening exercise per week for weight loss and overall health benefits. We discussed the following Behavioral Modification Strategies today: keeping healthy foods in the home and work on meal planning and easy cooking plans  Lindsay Wade has agreed to follow up with our clinic in 2 weeks. She was informed of the importance of frequent follow up visits to maximize her success with intensive lifestyle modifications for her multiple health conditions.  ALLERGIES: No Known Allergies  MEDICATIONS: Current Outpatient  Medications on File Prior to Visit  Medication Sig Dispense Refill   levothyroxine (SYNTHROID) 150 MCG tablet TAKE 1 TABLET BY MOUTH ONCE DAILY BEFORE BREAKFAST 90 tablet 4   lisinopril (ZESTRIL) 20 MG tablet TAKE 1 TABLET BY MOUTH DAILY. 90 tablet 1   No current facility-administered medications on file prior to visit.     PAST MEDICAL HISTORY: Past Medical History:  Diagnosis Date   Essential hypertension 04/15/2017   GERD (gastroesophageal reflux disease)    Headache    Hypothyroid    Hypothyroidism    MRSA infection    OSA (obstructive sleep apnea)     PAST SURGICAL HISTORY: Past Surgical History:  Procedure Laterality Date   BTS     CESAREAN SECTION     TONSILLECTOMY     uterine ablation      SOCIAL HISTORY: Social History   Tobacco Use   Smoking status: Never Smoker   Smokeless tobacco: Never Used  Substance Use Topics   Alcohol use: Yes    Comment: social drinker; very rare    Drug use: No    FAMILY HISTORY: Family History  Problem Relation Age of Onset   Hypertension Mother    Diabetes Mother        TYPE 2    Hyperlipidemia Mother    Thyroid disease Mother    Hypertension Father    Parkinsonism Father    Hyperlipidemia Father    Stroke Father    Hyperlipidemia Sister    Colon cancer Neg Hx    Stomach cancer Neg Hx    Pancreatic cancer Neg Hx     ROS: Review of Systems  Constitutional: Negative for weight loss.  Cardiovascular: Negative for chest pain.  Gastrointestinal: Negative for nausea and vomiting.  Musculoskeletal:       Negative for muscle weakness    PHYSICAL EXAM: Blood pressure 139/81, pulse 77, temperature 98.3 F (36.8 C), temperature source Oral, height 5\' 6"  (1.676 m), weight 241 lb (109.3 kg), SpO2 97 %. Body mass index is 38.9 kg/m. Physical Exam Vitals signs reviewed.  Constitutional:      Appearance: Normal appearance. She is well-developed. She is obese.  Cardiovascular:     Rate and  Rhythm: Normal rate.  Pulmonary:     Effort: Pulmonary effort is normal.  Musculoskeletal: Normal range of motion.  Skin:    General: Skin is warm and dry.  Neurological:     Mental Status: She is alert and oriented to person, place, and time.  Psychiatric:        Mood and Affect: Mood normal.        Behavior: Behavior normal.     RECENT LABS AND TESTS: BMET    Component Value Date/Time   NA 140 02/22/2019 0956   K 4.5 02/22/2019 0956   CL 101 02/22/2019 0956   CO2 24 02/22/2019 0956   GLUCOSE 90 02/22/2019 0956   GLUCOSE 96 12/17/2017 0737   BUN 17 02/22/2019 0956   CREATININE 0.88 02/22/2019 0956   CALCIUM 9.5 02/22/2019 0956   GFRNONAA 77 02/22/2019 0956   GFRAA 89 02/22/2019 0956   Lab Results  Component Value Date   HGBA1C 5.5 02/22/2019   Lab Results  Component  Value Date   INSULIN 11.9 02/22/2019   CBC    Component Value Date/Time   WBC 5.4 02/22/2019 0956   WBC 6.1 12/17/2017 0737   RBC 4.70 02/22/2019 0956   RBC 4.24 12/17/2017 0737   HGB 13.4 02/22/2019 0956   HCT 41.3 02/22/2019 0956   PLT 265.0 12/17/2017 0737   MCV 88 02/22/2019 0956   MCH 28.5 02/22/2019 0956   MCHC 32.4 02/22/2019 0956   MCHC 33.3 12/17/2017 0737   RDW 13.0 02/22/2019 0956   LYMPHSABS 2.2 02/22/2019 0956   EOSABS 0.0 02/22/2019 0956   BASOSABS 0.0 02/22/2019 0956   Iron/TIBC/Ferritin/ %Sat No results found for: IRON, TIBC, FERRITIN, IRONPCTSAT Lipid Panel     Component Value Date/Time   CHOL 235 (H) 02/22/2019 0956   TRIG 112 02/22/2019 0956   HDL 72 02/22/2019 0956   CHOLHDL 4 12/17/2017 0737   VLDL 36.8 12/17/2017 0737   LDLCALC 141 (H) 02/22/2019 0956   Hepatic Function Panel     Component Value Date/Time   PROT 6.9 02/22/2019 0956   ALBUMIN 4.8 02/22/2019 0956   AST 14 02/22/2019 0956   ALT 14 02/22/2019 0956   ALKPHOS 82 02/22/2019 0956   BILITOT 0.4 02/22/2019 0956      Component Value Date/Time   TSH 0.568 02/22/2019 0956   TSH 5.35 (H) 12/17/2017  0737   TSH 0.47 02/18/2017 1346     Ref. Range 02/22/2019 09:56  Vitamin D, 25-Hydroxy Latest Ref Range: 30.0 - 100.0 ng/mL 15.7 (L)    OBESITY BEHAVIORAL INTERVENTION VISIT  Today's visit was # 4   Starting weight: 238 lbs Starting date: 02/22/2019 Today's weight : 241 lbs Today's date: 04/25/2019 Total lbs lost to date: 0    04/25/2019  Height 5\' 6"  (1.676 m)  Weight 241 lb (109.3 kg)  BMI (Calculated) 38.92  BLOOD PRESSURE - SYSTOLIC [id number]  BLOOD PRESSURE - DIASTOLIC 81   Body Fat % 99991111 %  Total Body Water (lbs) 89.8 lbs    ASK: We discussed the diagnosis of obesity with Charlynne Cousins today and Amierah agreed to give [place] permission to discuss obesity behavioral modification therapy today.  ASSESS: Paige has the diagnosis of obesity and her BMI today is 38.92 Crystiana is in the action stage of change   ADVISE: Nazalia was educated on the multiple health risks of obesity as well as the benefit of weight loss to improve her health. She was advised of the need for long term treatment and the importance of lifestyle modifications to improve her current health and to decrease her risk of future health problems.  AGREE: Multiple dietary modification options and treatment options were discussed and  Avishka agreed to follow the recommendations documented in the above note.  ARRANGE: Aytana was educated on the importance of frequent visits to treat obesity as outlined per CMS and USPSTF guidelines and agreed to schedule her next follow up appointment today.  Corey Skains, am acting as transcriptionist for Abby Potash, PA-C I, Abby Potash, PA-C have reviewed above note and agree with its content

## 2019-05-18 ENCOUNTER — Ambulatory Visit (INDEPENDENT_AMBULATORY_CARE_PROVIDER_SITE_OTHER): Payer: 59 | Admitting: Physician Assistant

## 2019-05-18 ENCOUNTER — Encounter (INDEPENDENT_AMBULATORY_CARE_PROVIDER_SITE_OTHER): Payer: Self-pay | Admitting: Physician Assistant

## 2019-05-18 ENCOUNTER — Other Ambulatory Visit: Payer: Self-pay

## 2019-05-18 VITALS — BP 133/83 | HR 81 | Temp 98.5°F | Ht 66.0 in | Wt 245.0 lb

## 2019-05-18 DIAGNOSIS — E038 Other specified hypothyroidism: Secondary | ICD-10-CM

## 2019-05-18 DIAGNOSIS — Z9189 Other specified personal risk factors, not elsewhere classified: Secondary | ICD-10-CM

## 2019-05-18 DIAGNOSIS — Z6839 Body mass index (BMI) 39.0-39.9, adult: Secondary | ICD-10-CM

## 2019-05-18 DIAGNOSIS — E559 Vitamin D deficiency, unspecified: Secondary | ICD-10-CM

## 2019-05-18 MED ORDER — VITAMIN D (ERGOCALCIFEROL) 1.25 MG (50000 UNIT) PO CAPS: 50000.0000 [IU] | ORAL_CAPSULE | ORAL | 0 refills | Status: DC

## 2019-05-18 MED FILL — VIT D2 1.25 MG (50,000 UNIT: 1.25 MG | 28 days supply | Qty: 4 | Fill #0

## 2019-05-19 NOTE — Progress Notes (Signed)
Office: [phone]  /  Fax: [phone]   HPI:   Chief Complaint: OBESITY Lindsay Wade is here to discuss her progress with her obesity treatment plan. She is on the keep a food journal with 1400 to 1600 calories and 95 grams of protein daily plan and she is following her eating plan approximately 25 % of the time. She states she is exercising 0 minutes 0 times per week. Lindsay Wade reports that she "hasn't bothered" following the plan, due to her birthday and a lack of motivation. She is ready to get back on track. Her weight is 245 lb (111.1 kg) today and has had a weight gain of 4 pounds over a period of 3 weeks since her last visit. She has gained 7 lbs since starting treatment with [place].  Vitamin D deficiency Lindsay Wade has a diagnosis of vitamin D deficiency. Lindsay Wade is currently taking vit D and she denies nausea, vomiting or muscle weakness.  At risk for osteopenia and osteoporosis Lindsay Wade is at higher risk of osteopenia and osteoporosis due to vitamin D deficiency.   Hypothyroidism Lindsay Wade has a diagnosis of hypothyroidism. She is on levothyroxine. She denies hot or cold intolerance.  ASSESSMENT AND PLAN:  Vitamin D deficiency - Plan: Vitamin D, Ergocalciferol, (DRISDOL) 1.25 MG (50000 UT) CAPS capsule  Other specified hypothyroidism  At risk for osteoporosis  Class 2 severe obesity with serious comorbidity and body mass index (BMI) of 39.0 to 39.9 in adult, unspecified obesity type (Lindsay Wade)  PLAN:  Vitamin D Deficiency Lindsay Wade was informed that low vitamin D levels contributes to fatigue and are associated with obesity, breast, and colon cancer. Lindsay Wade agrees to continue to take prescription Vit D @50 ,000 IU every week #4 with no refills (to [facility]) and she will follow up for routine testing of vitamin D, at least 2-3 times per year. She was informed of the risk of over-replacement of vitamin D and agrees to not increase her dose unless she discusses this with [place] first. Lindsay Wade  agrees to follow up with our clinic in 3 weeks.  At risk for osteopenia and osteoporosis Lindsay Wade was given extended  (15 minutes) osteoporosis prevention counseling today. Lindsay Wade is at risk for osteopenia and osteoporosis due to her vitamin D deficiency. She was encouraged to take her vitamin D and follow her higher calcium diet and increase strengthening exercise to help strengthen her bones and decrease her risk of osteopenia and osteoporosis.  Hypothyroidism Lindsay Wade was informed of the importance of good thyroid control to help with weight loss efforts. She was also informed that supertherapeutic thyroid levels are dangerous and will not improve weight loss results. Lindsay Wade will continue levothyroxine and follow up as directed.  Obesity Lindsay Wade is currently in the action stage of change. As such, her goal is to continue with weight loss efforts She has agreed to keep a food journal with 1400 to 1600 calories and 115 grams of protein daily Lindsay Wade has been instructed to work up to a goal of 150 minutes of combined cardio and strengthening exercise per week for weight loss and overall health benefits. We discussed the following Behavioral Modification Strategies today: no skipping meals and work on meal planning and easy cooking plans  Lindsay Wade has agreed to follow up with our clinic in 3 weeks. She was informed of the importance of frequent follow up visits to maximize her success with intensive lifestyle modifications for her multiple health conditions.  ALLERGIES: No Known Allergies  MEDICATIONS: Current Outpatient Medications on File Prior to  Visit  Medication Sig Dispense Refill   levothyroxine (SYNTHROID) 150 MCG tablet TAKE 1 TABLET BY MOUTH ONCE DAILY BEFORE BREAKFAST 90 tablet 4   lisinopril (ZESTRIL) 20 MG tablet TAKE 1 TABLET BY MOUTH DAILY. 90 tablet 1   No current facility-administered medications on file prior to visit.     PAST MEDICAL HISTORY: Past Medical History:    Diagnosis Date   Essential hypertension 04/15/2017   GERD (gastroesophageal reflux disease)    Headache    Hypothyroid    Hypothyroidism    MRSA infection    OSA (obstructive sleep apnea)     PAST SURGICAL HISTORY: Past Surgical History:  Procedure Laterality Date   BTS     CESAREAN SECTION     TONSILLECTOMY     uterine ablation      SOCIAL HISTORY: Social History   Tobacco Use   Smoking status: Never Smoker   Smokeless tobacco: Never Used  Substance Use Topics   Alcohol use: Yes    Comment: social drinker; very rare    Drug use: No    FAMILY HISTORY: Family History  Problem Relation Age of Onset   Hypertension Mother    Diabetes Mother        TYPE 2    Hyperlipidemia Mother    Thyroid disease Mother    Hypertension Father    Parkinsonism Father    Hyperlipidemia Father    Stroke Father    Hyperlipidemia Sister    Colon cancer Neg Hx    Stomach cancer Neg Hx    Pancreatic cancer Neg Hx     ROS: Review of Systems  Constitutional: Negative for weight loss.  Gastrointestinal: Negative for nausea and vomiting.  Musculoskeletal:       Negative for muscle weakness  Endo/Heme/Allergies:       Negative for heat or cold intolerance    PHYSICAL EXAM: Blood pressure 133/83, pulse 81, temperature 98.5 F (36.9 C), temperature source Oral, height 5\' 6"  (1.676 m), weight 245 lb (111.1 kg), SpO2 98 %. Body mass index is 39.54 kg/m. Physical Exam Vitals signs reviewed.  Constitutional:      Appearance: Normal appearance. She is well-developed. She is obese.  Cardiovascular:     Rate and Rhythm: Normal rate.  Pulmonary:     Effort: Pulmonary effort is normal.  Musculoskeletal: Normal range of motion.  Skin:    General: Skin is warm and dry.  Neurological:     Mental Status: She is alert and oriented to person, place, and time.  Psychiatric:        Mood and Affect: Mood normal.        Behavior: Behavior normal.     RECENT  LABS AND TESTS: BMET    Component Value Date/Time   NA 140 02/22/2019 0956   K 4.5 02/22/2019 0956   CL 101 02/22/2019 0956   CO2 24 02/22/2019 0956   GLUCOSE 90 02/22/2019 0956   GLUCOSE 96 12/17/2017 0737   BUN 17 02/22/2019 0956   CREATININE 0.88 02/22/2019 0956   CALCIUM 9.5 02/22/2019 0956   GFRNONAA 77 02/22/2019 0956   GFRAA 89 02/22/2019 0956   Lab Results  Component Value Date   HGBA1C 5.5 02/22/2019   Lab Results  Component Value Date   INSULIN 11.9 02/22/2019   CBC    Component Value Date/Time   WBC 5.4 02/22/2019 0956   WBC 6.1 12/17/2017 0737   RBC 4.70 02/22/2019 0956   RBC 4.24 12/17/2017 0737  HGB 13.4 02/22/2019 0956   HCT 41.3 02/22/2019 0956   PLT 265.0 12/17/2017 0737   MCV 88 02/22/2019 0956   MCH 28.5 02/22/2019 0956   MCHC 32.4 02/22/2019 0956   MCHC 33.3 12/17/2017 0737   RDW 13.0 02/22/2019 0956   LYMPHSABS 2.2 02/22/2019 0956   EOSABS 0.0 02/22/2019 0956   BASOSABS 0.0 02/22/2019 0956   Iron/TIBC/Ferritin/ %Sat No results found for: IRON, TIBC, FERRITIN, IRONPCTSAT Lipid Panel     Component Value Date/Time   CHOL 235 (H) 02/22/2019 0956   TRIG 112 02/22/2019 0956   HDL 72 02/22/2019 0956   CHOLHDL 4 12/17/2017 0737   VLDL 36.8 12/17/2017 0737   LDLCALC 141 (H) 02/22/2019 0956   Hepatic Function Panel     Component Value Date/Time   PROT 6.9 02/22/2019 0956   ALBUMIN 4.8 02/22/2019 0956   AST 14 02/22/2019 0956   ALT 14 02/22/2019 0956   ALKPHOS 82 02/22/2019 0956   BILITOT 0.4 02/22/2019 0956      Component Value Date/Time   TSH 0.568 02/22/2019 0956   TSH 5.35 (H) 12/17/2017 0737   TSH 0.47 02/18/2017 1346     Ref. Range 02/22/2019 09:56  Vitamin D, 25-Hydroxy Latest Ref Range: 30.0 - 100.0 ng/mL 15.7 (L)    OBESITY BEHAVIORAL INTERVENTION VISIT  Today's visit was # 5   Starting weight: 238 lbs Starting date: 02/22/2019 Today's weight : 245 lbs Today's date: 05/18/2019 Total lbs lost to date: 0     05/18/2019  Height 5\' 6"  (1.676 m)  Weight 245 lb (111.1 kg)  BMI (Calculated) 39.56  BLOOD PRESSURE - SYSTOLIC [id number]  BLOOD PRESSURE - DIASTOLIC 83   Body Fat % 48 %  Total Body Water (lbs) 91.2 lbs    ASK: We discussed the diagnosis of obesity with Lindsay Wade today and Lindsay Wade agreed to give [place] permission to discuss obesity behavioral modification therapy today.  ASSESS: Lindsay Wade has the diagnosis of obesity and her BMI today is 39.56 Lindsay Wade is in the action stage of change   ADVISE: Larrisha was educated on the multiple health risks of obesity as well as the benefit of weight loss to improve her health. She was advised of the need for long term treatment and the importance of lifestyle modifications to improve her current health and to decrease her risk of future health problems.  AGREE: Multiple dietary modification options and treatment options were discussed and  Lindsay Wade agreed to follow the recommendations documented in the above note.  ARRANGE: Lindsay Wade was educated on the importance of frequent visits to treat obesity as outlined per CMS and USPSTF guidelines and agreed to schedule her next follow up appointment today.  Lindsay Wade, am acting as transcriptionist for Abby Potash, PA-C I, Abby Potash, PA-C have reviewed above note and agree with its content

## 2019-05-27 ENCOUNTER — Telehealth: Payer: Self-pay

## 2019-05-27 NOTE — Telephone Encounter (Signed)
Approval for Dexilant 60mg  from 03/08/2019 to 03/06/2020 with maximum of 12 fills Prior Auth reference number [id number])  from [place]

## 2019-06-08 ENCOUNTER — Ambulatory Visit (INDEPENDENT_AMBULATORY_CARE_PROVIDER_SITE_OTHER): Payer: 59 | Admitting: Physician Assistant

## 2019-06-09 ENCOUNTER — Other Ambulatory Visit: Payer: Self-pay | Admitting: Family Medicine

## 2019-06-09 MED FILL — LISINOPRIL 20 MG TABLET: 20 | 90 days supply | Qty: 90 | Fill #1

## 2019-06-10 MED FILL — SYNTHROID 150 MCG TABLET: 150 | 90 days supply | Qty: 90 | Fill #0

## 2019-06-14 ENCOUNTER — Encounter: Payer: Self-pay | Admitting: Family Medicine

## 2019-07-19 ENCOUNTER — Other Ambulatory Visit: Payer: Self-pay

## 2019-07-19 ENCOUNTER — Encounter (INDEPENDENT_AMBULATORY_CARE_PROVIDER_SITE_OTHER): Payer: Self-pay | Admitting: Physician Assistant

## 2019-07-19 ENCOUNTER — Ambulatory Visit (INDEPENDENT_AMBULATORY_CARE_PROVIDER_SITE_OTHER): Payer: 59 | Admitting: Physician Assistant

## 2019-07-19 VITALS — BP 159/85 | HR 60 | Temp 97.8°F | Ht 66.0 in | Wt 253.0 lb

## 2019-07-19 DIAGNOSIS — Z9189 Other specified personal risk factors, not elsewhere classified: Secondary | ICD-10-CM | POA: Diagnosis not present

## 2019-07-19 DIAGNOSIS — Z6841 Body Mass Index (BMI) 40.0 and over, adult: Secondary | ICD-10-CM

## 2019-07-19 DIAGNOSIS — E7849 Other hyperlipidemia: Secondary | ICD-10-CM | POA: Diagnosis not present

## 2019-07-19 DIAGNOSIS — E559 Vitamin D deficiency, unspecified: Secondary | ICD-10-CM | POA: Diagnosis not present

## 2019-07-19 MED ORDER — VITAMIN D (ERGOCALCIFEROL) 1.25 MG (50000 UNIT) PO CAPS: 50000.0000 [IU] | ORAL_CAPSULE | ORAL | 0 refills | Status: DC

## 2019-07-19 MED FILL — VIT D2 1.25 MG (50,000 UNIT: 1.25 MG | 28 days supply | Qty: 4 | Fill #0

## 2019-07-19 NOTE — Progress Notes (Signed)
Chief Complaint:   Lindsay Wade is here to discuss her progress with her obesity treatment plan along with follow-up of her obesity related diagnoses. Lindsay Wade is keeping a food journal and adhering to recommended goals of 1400-1600 calories and 115g of protein daily and states she is following her eating plan approximately 0% of the time. Lindsay Wade states she is not exercising.   Today's visit was #: 6 Starting weight: 238 lb Starting date: 02/22/19 Today's weight: 253 lbs Today's date: 07/19/2019 Total lbs lost to date: 0 Total lbs lost since last in-office visit: 0  Interim History: Lindsay Wade reports that she over indulged during the holidays with her son being home. She states she physically feels miserable and ready to get back on track.   Subjective:   1. Vitamin D deficiency She's Vitamin D level was 15.7 on 02/22/19. She is currently taking vit D. She denies nausea, vomiting or muscle weakness. She is due for labs, but declines labs today.   2. Other hyperlipidemia Lindsay Wade has hyperlipidemia and has been trying to improve her cholesterol levels with intensive lifestyle modification including a low saturated fat diet and weight loss. She is not exercising. She denies any chest pain, claudication or myalgias. She is due for labs today but declines.   Lab Results  Component Value Date   ALT 14 02/22/2019   AST 14 02/22/2019   ALKPHOS 82 02/22/2019   BILITOT 0.4 02/22/2019   Lab Results  Component Value Date   CHOL 235 (H) 02/22/2019   HDL 72 02/22/2019   LDLCALC 141 (H) 02/22/2019   TRIG 112 02/22/2019   CHOLHDL 4 12/17/2017    3. At risk for heart disease Lindsay Wade is at a higher than average risk for cardiovascular disease due to obesity. Reviewed: no chest pain on exertion, no dyspnea on exertion, and no swelling of ankles.   Assessment/Plan:   1. Vitamin D deficiency Low Vitamin D level contributes to fatigue and are associated with obesity, breast,  and colon cancer. She agrees to continue to take prescription Vitamin D @50 ,000 IU every week and will follow-up for routine testing of vitamin D, at least 2-3 times per year to avoid over-replacement. - Vitamin D, Ergocalciferol, (DRISDOL) 1.25 MG (50000 UNIT) CAPS capsule; Take 1 capsule (50,000 Units total) by mouth every 7 (seven) days.  Dispense: 4 capsule; Refill: 0  2. Other hyperlipidemia Cardiovascular risk and specific lipid/LDL goals reviewed.  We discussed several lifestyle modifications today and Lindsay Wade will continue to work on diet, exercise and weight loss efforts. Orders and follow up as documented in patient record.   Counseling Intensive lifestyle modifications are the first line treatment for this issue. . Dietary changes: Increase soluble fiber. Decrease simple carbohydrates. . Exercise changes: Moderate to vigorous-intensity aerobic activity 150 minutes per week if tolerated. . Lipid-lowering medications: see documented in medical record.  3. At risk for heart disease Lindsay Wade was given approximately 15 minutes of coronary artery disease prevention counseling today. She is 54 y.o. female and has risk factors for heart disease including obesity. We discussed intensive lifestyle modifications today with an emphasis on specific weight loss instructions and strategies.   4. Class 3 severe obesity with serious comorbidity and body mass index (BMI) of 40.0 to 44.9 in adult, unspecified obesity type Lindsay Wade) Lindsay Wade is currently in the action stage of change. As such, her goal is to continue with weight loss efforts. She has agreed to keeping a food journal and adhering to  recommended goals of 1400-1600 calories and 115g of protein daily.   We discussed the following exercise goals today: For substantial health benefits, adults should do at least 150 minutes (2 hours and 30 minutes) a week of moderate-intensity, or 75 minutes (1 hour and 15 minutes) a week of vigorous-intensity aerobic  physical activity, or an equivalent combination of moderate- and vigorous-intensity aerobic activity. Aerobic activity should be performed in episodes of at least 10 minutes, and preferably, it should be spread throughout the week. Adults should also include muscle-strengthening activities that involve all major muscle groups on 2 or more days a week.  We discussed the following behavioral modification strategies today: increasing lean protein intake and decreasing simple carbohydrates.  Lindsay Wade has agreed to follow-up with our clinic in 2 weeks. She was informed of the importance of frequent follow-up visits to maximize her success with intensive lifestyle modifications for her multiple health conditions.   Objective:   Blood pressure (!) 159/85, pulse 60, temperature 97.8 F (36.6 C), temperature source Oral, height 5\' 6"  (1.676 m), weight 253 lb (114.8 kg), SpO2 99 %. Body mass index is 40.84 kg/m.  General: Cooperative, alert, well developed, in no acute distress. HEENT: Conjunctivae and lids unremarkable. Neck: No thyromegaly.  Cardiovascular: Regular rhythm.  Lungs: Normal work of breathing. Extremities: No edema.  Neurologic: No focal deficits.   Lab Results  Component Value Date   CREATININE 0.88 02/22/2019   BUN 17 02/22/2019   NA 140 02/22/2019   K 4.5 02/22/2019   CL 101 02/22/2019   CO2 24 02/22/2019   Lab Results  Component Value Date   ALT 14 02/22/2019   AST 14 02/22/2019   ALKPHOS 82 02/22/2019   BILITOT 0.4 02/22/2019   Lab Results  Component Value Date   HGBA1C 5.5 02/22/2019   Lab Results  Component Value Date   INSULIN 11.9 02/22/2019   Lab Results  Component Value Date   TSH 0.568 02/22/2019   Lab Results  Component Value Date   CHOL 235 (H) 02/22/2019   HDL 72 02/22/2019   LDLCALC 141 (H) 02/22/2019   TRIG 112 02/22/2019   CHOLHDL 4 12/17/2017   Lab Results  Component Value Date   WBC 5.4 02/22/2019   HGB 13.4 02/22/2019    HCT 41.3 02/22/2019   MCV 88 02/22/2019   PLT 265.0 12/17/2017    Attestation Statements:   Reviewed by clinician on day of visit: allergies, medications, problem list, medical history, surgical history, family history, social history, and previous encounter notes.  I, Renee Ramus, am acting as Location manager for [employer], PA-C.  I have reviewed the above documentation for accuracy and completeness, and I agree with the above. Abby Potash, PA-C

## 2019-08-03 ENCOUNTER — Ambulatory Visit (INDEPENDENT_AMBULATORY_CARE_PROVIDER_SITE_OTHER): Payer: 59 | Admitting: Physician Assistant

## 2019-08-03 ENCOUNTER — Other Ambulatory Visit: Payer: Self-pay

## 2019-08-03 ENCOUNTER — Encounter (INDEPENDENT_AMBULATORY_CARE_PROVIDER_SITE_OTHER): Payer: Self-pay | Admitting: Physician Assistant

## 2019-08-03 VITALS — BP 129/81 | HR 66 | Temp 98.5°F | Ht 66.0 in | Wt 246.0 lb

## 2019-08-03 DIAGNOSIS — E038 Other specified hypothyroidism: Secondary | ICD-10-CM | POA: Diagnosis not present

## 2019-08-03 DIAGNOSIS — Z6839 Body mass index (BMI) 39.0-39.9, adult: Secondary | ICD-10-CM | POA: Diagnosis not present

## 2019-08-03 DIAGNOSIS — E7849 Other hyperlipidemia: Secondary | ICD-10-CM | POA: Diagnosis not present

## 2019-08-03 NOTE — Progress Notes (Signed)
Chief Complaint:   Lindsay Wade is here to discuss her progress with her obesity treatment plan along with follow-up of her obesity related diagnoses. Lindsay Wade is keeping a food journal and adhering to recommended goals of 1400-1600 calories and 115 grams of protein and states she is following her eating plan approximately 95% of the time. Lindsay Wade states she is exercising 0 minutes 0 times per week.  Today's visit was #: 7 Starting weight: 238 lbs Starting date: 02/22/2019 Today's weight: 246 lbs Today's date: 08/03/2019 Total lbs lost to date: 0 Total lbs lost since last in-office visit: 7  Interim History: Lindsay Wade has done a great job the last 2 weeks getting back on track. She is going to start walking this week.  Subjective:   Other specified hypothyroidism. Lindsay Wade is on levothyroxine. No excessive hair loss or excessive fatigue.  Lab Results  Component Value Date   TSH 0.568 02/22/2019   Other hyperlipidemia. No chest pain. No medications. She is not exercising.   Lab Results  Component Value Date   CHOL 235 (H) 02/22/2019   HDL 72 02/22/2019   LDLCALC 141 (H) 02/22/2019   TRIG 112 02/22/2019   CHOLHDL 4 12/17/2017   Lab Results  Component Value Date   ALT 14 02/22/2019   AST 14 02/22/2019   ALKPHOS 82 02/22/2019   BILITOT 0.4 02/22/2019   The 10-year ASCVD risk score Lindsay Bussing DC Jr., et al., 2013) is: 1.7%   Values used to calculate the score:     Age: 54 years     Sex: Female     Is Non-Hispanic African American: No     Diabetic: No     Tobacco smoker: No     Systolic Blood Pressure: [id number] mmHg     Is BP treated: Yes     HDL Cholesterol: 72 mg/dL     Total Cholesterol: 235 mg/dL  Assessment/Plan:   Other specified hypothyroidism. Patient with long-standing hypothyroidism, on levothyroxine therapy. She appears euthyroid. Orders and follow up as documented in patient record. Lindsay Wade will continue her medications as  prescribed.  Counseling . Good thyroid control is important for overall health. Supratherapeutic thyroid levels are dangerous and will not improve weight loss results. The correct way to take levothyroxine is fasting, with water, separated by at least 30 minutes from breakfast, and separated by more than 4 hours from calcium, iron, multivitamins, acid reflux medications (PPIs).   Other hyperlipidemia. Cardiovascular risk and specific lipid/LDL goals reviewed.  We discussed several lifestyle modifications today and Lindsay Wade will continue to work on diet, exercise and weight loss efforts. Orders and follow up as documented in patient record.   Counseling Intensive lifestyle modifications are the first line treatment for this issue. . Dietary changes: Increase soluble fiber. Decrease simple carbohydrates. . Exercise changes: Moderate to vigorous-intensity aerobic activity 150 minutes per week if tolerated. . Lipid-lowering medications: see documented in medical record.  Class 2 severe obesity with serious comorbidity and body mass index (BMI) of 39.0 to 39.9 in adult, unspecified obesity type ([place]).  Lindsay Wade is currently in the action stage of change. As such, her goal is to continue with weight loss efforts. She has agreed to keeping a food journal and adhering to recommended goals of 1500-1600 calories and 95 grams of protein daily.  Exercise goals: For substantial health benefits, adults should do at least 150 minutes (2 hours and 30 minutes) a week of moderate-intensity, or 75 minutes (1 hour and 15  minutes) a week of vigorous-intensity aerobic physical activity, or an equivalent combination of moderate- and vigorous-intensity aerobic activity. Aerobic activity should be performed in episodes of at least 10 minutes, and preferably, it should be spread throughout the week.  Behavioral modification strategies: meal planning and cooking strategies and keeping healthy foods in the home.  Lindsay Wade  has agreed to follow-up with our clinic in 2 weeks. She was informed of the importance of frequent follow-up visits to maximize her success with intensive lifestyle modifications for her multiple health conditions.   Lindsay Wade was informed we would discuss her lab results at her next visit unless there is a critical issue that needs to be addressed sooner. Lindsay Wade agreed to keep her next visit at the agreed upon time to discuss these results.  Objective:   Blood pressure 129/81, pulse 66, temperature 98.5 F (36.9 C), temperature source Oral, height 5\' 6"  (1.676 m), weight 246 lb (111.6 kg), SpO2 99 %. Body mass index is 39.71 kg/m.  General: Cooperative, alert, well developed, in no acute distress. HEENT: Conjunctivae and lids unremarkable. Cardiovascular: Regular rhythm.  Lungs: Normal work of breathing. Neurologic: No focal deficits.   Lab Results  Component Value Date   CREATININE 0.88 02/22/2019   BUN 17 02/22/2019   NA 140 02/22/2019   K 4.5 02/22/2019   CL 101 02/22/2019   CO2 24 02/22/2019   Lab Results  Component Value Date   ALT 14 02/22/2019   AST 14 02/22/2019   ALKPHOS 82 02/22/2019   BILITOT 0.4 02/22/2019   Lab Results  Component Value Date   HGBA1C 5.5 02/22/2019   Lab Results  Component Value Date   INSULIN 11.9 02/22/2019   Lab Results  Component Value Date   TSH 0.568 02/22/2019   Lab Results  Component Value Date   CHOL 235 (H) 02/22/2019   HDL 72 02/22/2019   LDLCALC 141 (H) 02/22/2019   TRIG 112 02/22/2019   CHOLHDL 4 12/17/2017   Lab Results  Component Value Date   WBC 5.4 02/22/2019   HGB 13.4 02/22/2019   HCT 41.3 02/22/2019   MCV 88 02/22/2019   PLT 265.0 12/17/2017   No results found for: IRON, TIBC, FERRITIN  Attestation Statements:   Reviewed by clinician on day of visit: allergies, medications, problem list, medical history, surgical history, family history, social history, and previous encounter notes.  Time spent on  visit including pre-visit chart review and post-visit care was 32 minutes.   IMichaelene Song, am acting as transcriptionist for Abby Potash, PA-C   I have reviewed the above documentation for accuracy and completeness, and I agree with the above. Abby Potash, PA-C

## 2019-08-12 ENCOUNTER — Encounter (INDEPENDENT_AMBULATORY_CARE_PROVIDER_SITE_OTHER): Payer: Self-pay | Admitting: Physician Assistant

## 2019-08-16 ENCOUNTER — Ambulatory Visit (INDEPENDENT_AMBULATORY_CARE_PROVIDER_SITE_OTHER): Payer: 59 | Admitting: Physician Assistant

## 2019-09-13 ENCOUNTER — Other Ambulatory Visit: Payer: Self-pay | Admitting: Family Medicine

## 2019-09-13 ENCOUNTER — Other Ambulatory Visit (INDEPENDENT_AMBULATORY_CARE_PROVIDER_SITE_OTHER): Payer: Self-pay | Admitting: Physician Assistant

## 2019-09-13 DIAGNOSIS — E559 Vitamin D deficiency, unspecified: Secondary | ICD-10-CM

## 2019-09-13 MED FILL — SYNTHROID 150 MCG TABLET: 150 | 90 days supply | Qty: 90 | Fill #0

## 2019-09-13 MED FILL — LISINOPRIL 20 MG TABLET: 20 | 90 days supply | Qty: 90 | Fill #0

## 2019-09-19 ENCOUNTER — Ambulatory Visit (INDEPENDENT_AMBULATORY_CARE_PROVIDER_SITE_OTHER): Payer: 59 | Admitting: Physician Assistant

## 2019-10-05 ENCOUNTER — Encounter: Payer: Self-pay | Admitting: Family Medicine

## 2019-10-05 ENCOUNTER — Ambulatory Visit: Payer: 59 | Admitting: Family Medicine

## 2019-10-05 ENCOUNTER — Other Ambulatory Visit: Payer: Self-pay

## 2019-10-05 VITALS — BP 122/84 | HR 57 | Temp 96.8°F | Ht 66.0 in | Wt 250.1 lb

## 2019-10-05 DIAGNOSIS — E038 Other specified hypothyroidism: Secondary | ICD-10-CM

## 2019-10-05 DIAGNOSIS — L918 Other hypertrophic disorders of the skin: Secondary | ICD-10-CM | POA: Diagnosis not present

## 2019-10-05 DIAGNOSIS — S29011A Strain of muscle and tendon of front wall of thorax, initial encounter: Secondary | ICD-10-CM | POA: Diagnosis not present

## 2019-10-05 DIAGNOSIS — D489 Neoplasm of uncertain behavior, unspecified: Secondary | ICD-10-CM

## 2019-10-05 NOTE — Progress Notes (Signed)
Musculoskeletal Exam  Patient: Lindsay Wade DOB: [birth year]  DOS: 10/05/2019  SUBJECTIVE:  Chief Complaint:   Chief Complaint  Patient presents with  . Skin Tag  . Shoulder Pain    right  . Gas    Lindsay Wade is a 54 y.o.  female for evaluation and treatment of R chest/shoulder pain.   Onset:  3 months ago. Was pulling tubs  Location: R side of chest Character:  aching  Progression of issue:  is unchanged Associated symptoms: sometimes hurts to take a deep breath Treatment: to date has been rest.   Neurovascular symptoms: no No lumps or bumps in breast, getting mammogram coming up.   Skin lesion on stomach that is becoming more painful. No bleeding, drainage, itching. No new lotions, soaps, topicals or detergents. Has not tried anything on it so far.   Past Medical History:  Diagnosis Date  . Essential hypertension 04/15/2017  . GERD (gastroesophageal reflux disease)   . Headache   . Hypothyroid   . Hypothyroidism   . MRSA infection   . OSA (obstructive sleep apnea)     Objective: VITAL SIGNS: BP 122/84 (BP Location: Left Arm, Patient Position: Sitting, Cuff Size: Large)   Pulse (!) 57   Temp (!) 96.8 F (36 C) (Temporal)   Ht 5\' 6"  (1.676 m)   Wt 250 lb 2 oz (113.5 kg)   SpO2 94%   BMI 40.37 kg/m  Constitutional: Well formed, well developed. No acute distress. Cardiovascular: Brisk cap refill Thorax & Lungs: No accessory muscle use Musculoskeletal: R upper chest.   Stretching the pec reproduces slight ache over area of interest Tenderness to palpation: yes over pec Deformity: no Ecchymosis: no Skin: See below Psychiatric: Normal mood. Age appropriate judgment and insight. Alert & oriented x 3.     R abd  Procedure note; shave biopsy Informed consent was obtained. The area was cleaned with alcohol and injected with 1.5 mL of 1% lidocaine with epinephrine. A Dermablade was slightly bent and used to cut under the area of interest. The  specimen was placed in a sterile specimen cup and sent to the lab. The area was then cauterized ensuring adequate hemostasis. The area was dressed with triple antibiotic ointment and a bandage. There were no complications noted. The patient tolerated the procedure well.  Assessment:  Pectoralis muscle strain, initial encounter  Neoplasm of uncertain behavior - Plan: PR SHAV SKIN LES 0.6-1.0 CM TRUNK,ARM,LEG  Other specified hypothyroidism - Plan: TSH, T4, free  Plan: 1-stretches and exercises, heat, ice, Tylenol.  If no improvement in the next 3-4 weeks, will consider physical therapy. 2-painful irritated lesion removed today.  Will sent to the lab.  Aftercare instructions verbalized and written down.  Warning signs and symptoms also verbalized written down. 3-follow-up on thyroid levels F/u as originally scheduled. The patient voiced understanding and agreement to the plan.   [place], DO 10/05/19  4:40 PM

## 2019-10-05 NOTE — Addendum Note (Signed)
Addended by: Sharon Seller B on: 10/05/2019 04:47 PM   Modules accepted: Orders

## 2019-10-05 NOTE — Patient Instructions (Signed)
Heat (pad or rice pillow in microwave) over affected area, 10-15 minutes twice daily.   I think getting a mammogram is a good idea. If you are still having issues in the next 3-4 weeks, let me know and we will get you set up with physical therapy.   OK to take Tylenol 1000 mg (2 extra strength tabs) or 975 mg (3 regular strength tabs) every 6 hours as needed.   Do not shower for the rest of the day. When you do wash it, use only soap and water. Do not vigorously scrub. Apply triple antibiotic ointment (like Neosporin) twice daily. Keep the area clean and dry.   Things to look out for: increasing pain not relieved by ibuprofen/acetaminophen, fevers, spreading redness, drainage of pus, or foul odor.  Give [place] 1 week to get the results of your biopsy back.  Let us know if you need anything.  Pectoralis Major Rehab Ask your health care provider which exercises are safe for you. Do exercises exactly as told by your health care provider and adjust them as directed. It is normal to feel mild stretching, pulling, tightness, or discomfort as you do these exercises, but you should stop right away if you feel sudden pain or your pain gets worse.Do not begin these exercises until told by your health care provider. Stretching and range of motion exercises These exercises warm up your muscles and joints and improve the movement and flexibility of your shoulder. These exercises can also help to relieve pain, numbness, and tingling. Exercise A: Pendulum  1. Stand near a wall or a surface that you can hold onto for balance. 2. Bend at the waist and let your left / right arm hang straight down. Use your other arm to keep your balance. 3. Relax your arm and shoulder muscles, and move your hips and your trunk so your left / right arm swings freely. Your arm should swing because of the motion of your body, not because you are using your arm or shoulder muscles. 4. Keep moving so your arm swings in the following  directions, as told by your health care provider: ? Side to side. ? Forward and backward. ? In clockwise and counterclockwise circles. 5. Slowly return to the starting position. Repeat 2 times. Complete this exercise 3 times per week. Exercise B: Abduction, standing 1. Stand and hold a broomstick, a cane, or a similar object. Place your hands a little more than shoulder-width apart on the object. Your left / right hand should be palm-up, and your other hand should be palm-down. 2. While keeping your elbow straight and your shoulder muscles relaxed, push the stick across your body toward your left / right side. Raise your left / right arm to the side of your body and then over your head until you feel a stretch in your shoulder. ? Stop when you reach the angle that is recommended by your health care provider. ? Avoid shrugging your shoulder while you raise your arm. Keep your shoulder blade tucked down toward the middle of your spine. 3. Hold for 10 seconds. 4. Slowly return to the starting position. Repeat 2 times. Complete this exercise 3 times per week. Exercise C: Wand flexion, supine  1. Lie on your back. You may bend your knees for comfort. 2. Hold a broomstick, a cane, or a similar object so that your hands are about shoulder-width apart on the object. Your palms should face toward your feet. 3. Raise your left / right arm  in front of your face, then behind your head (toward the floor). Use your other hand to help you do this. Stop when you feel a gentle stretch in your shoulder, or when you reach the angle that is recommended by your health care provider. 4. Hold for 3 seconds. 5. Use the broomstick and your other arm to help you return your left / right arm to the starting position. Repeat 2 times. Complete this exercise 3 times per week. Exercise D: Wand shoulder external rotation 1. Stand and hold a broomstick, a cane, or a similar object so your handsare about shoulder-width apart  on the object. 2. Start with your arms hanging down, then bend both elbows to an "L" shape (90 degrees). 3. Keep your left / right elbow at your side. Use your other hand to push the stick so your left / right forearm moves away from your body, out to your side. ? Keep your left / right elbow bent to 90 degrees and keep it against your side. ? Stop when you feel a gentle stretch in your shoulder, or when you reach the angle recommended by your health care provider. 4. Hold for 10 seconds. 5. Use the stick to help you return your left / right arm to the starting position. Repeat 2 times. Complete this exercise 3 times per week. Strengthening exercises These exercises build strength and endurance in your shoulder. Endurance is the ability to use your muscles for a long time, even after your muscles get tired. Exercise E: Scapular protraction, standing 1. Stand so you are facing a wall. Place your feet about one arm-length away from the wall. 2. Place your hands on the wall and straighten your elbows. 3. Keep your hands on the wall as you push your upper back away from the wall. You should feel your shoulder blades sliding forward.Keep your elbows and your head still. ? If you are not sure that you are doing this exercise correctly, ask your health care provider for more instructions. 4. Hold for 3 seconds. 5. Slowly return to the starting position. Let your muscles relax completely before you repeat this exercise. Repeat 2 times. Complete this exercise 3 times per week. Exercise F: Shoulder blade squeezes  (scapular retraction) 1. Sit with good posture in a stable chair. Do not let your back touch the back of the chair. 2. Your arms should be at your sides with your elbows bent. You may rest your forearms on a pillow if that is more comfortable. 3. Squeeze your shoulder blades together. Bring them down and back. ? Keep your shoulders level. ? Do not lift your shoulders up toward your  ears. 4. Hold for 3 seconds. 5. Return to the starting position. Repeat 2 times. Complete this exercise 3 times per week. This information is not intended to replace advice given to you by your health care provider. Make sure you discuss any questions you have with your health care provider. Document Released: 06/23/2005 Document Revised: 04/03/2016 Document Reviewed: 03/11/2015 Elsevier Interactive Patient Education  [employer].

## 2019-10-06 LAB — T4, FREE: Free T4: 1.26 ng/dL (ref 0.60–1.60)

## 2019-10-06 LAB — TSH: TSH: 0.04 u[IU]/mL — ABNORMAL LOW (ref 0.35–4.50)

## 2019-10-13 ENCOUNTER — Ambulatory Visit (INDEPENDENT_AMBULATORY_CARE_PROVIDER_SITE_OTHER): Payer: 59 | Admitting: Physician Assistant

## 2019-10-24 ENCOUNTER — Encounter: Payer: Self-pay | Admitting: Obstetrics & Gynecology

## 2019-10-24 ENCOUNTER — Ambulatory Visit (INDEPENDENT_AMBULATORY_CARE_PROVIDER_SITE_OTHER): Payer: 59 | Admitting: Obstetrics & Gynecology

## 2019-10-24 ENCOUNTER — Other Ambulatory Visit (HOSPITAL_COMMUNITY)
Admission: RE | Admit: 2019-10-24 | Discharge: 2019-10-24 | Disposition: A | Discharge status: Home / Self Care | Payer: 59 | Source: Ambulatory Visit | Attending: Obstetrics & Gynecology | Admitting: Obstetrics & Gynecology

## 2019-10-24 ENCOUNTER — Other Ambulatory Visit: Payer: Self-pay

## 2019-10-24 VITALS — BP 133/85 | HR 68 | Temp 98.3°F | Ht 66.0 in | Wt 252.0 lb

## 2019-10-24 DIAGNOSIS — Z1151 Encounter for screening for human papillomavirus (HPV): Secondary | ICD-10-CM

## 2019-10-24 DIAGNOSIS — Z01419 Encounter for gynecological examination (general) (routine) without abnormal findings: Secondary | ICD-10-CM

## 2019-10-24 DIAGNOSIS — Z98891 History of uterine scar from previous surgery: Secondary | ICD-10-CM

## 2019-10-24 DIAGNOSIS — M940 Chondrocostal junction syndrome [Tietze]: Secondary | ICD-10-CM | POA: Diagnosis not present

## 2019-10-24 MED ORDER — CELECOXIB 100 MG PO CAPS: 100.0000 mg | ORAL_CAPSULE | Freq: Two times a day (BID) | ORAL | 0 refills | Status: DC

## 2019-10-24 MED FILL — CELECOXIB 100 MG CAPS: 100 | 90 days supply | Qty: 180 | Fill #0

## 2019-10-24 NOTE — Progress Notes (Signed)
Subjective:     Lindsay Wade is a 54 y.o. female here for a routine exam.  Current complaints: pain along right side of chest, deep feeling, since Christmas.  NO breast mass felt.   Gynecologic History No LMP recorded. Patient has had an ablation. Contraception: post menopausal status Last Pap: 2018. Results were: normal Last mammogram: 2018. Results were: normal  Obstetric History OB History  Gravida Para Term Preterm AB Living  2 2 2     2   SAB TAB Ectopic Multiple Live Births          2    # Outcome Date GA Lbr Len/2nd Weight Sex Delivery Anes PTL Lv  2 Term      CS-LTranv     1 Term      CS-LTranv        The following portions of the patient's history were reviewed and updated as appropriate: allergies, current medications, past family history, past medical history, past social history, past surgical history and problem list.  Review of Systems Pertinent items noted in HPI and remainder of comprehensive ROS otherwise negative.    Objective:      Vitals:   10/24/19 1014  BP: 133/85  Pulse: 68  Temp: 98.3 F (36.8 C)  Weight: 252 lb (114.3 kg)  Height: 5\' 6"  (1.676 m)   Vitals:  WNL General appearance: alert, cooperative and no distress  HEENT: Normocephalic, without obvious abnormality, atraumatic Eyes: negative Throat: lips, mucosa, and tongue normal; teeth and gums normal  Respiratory: Clear to auscultation bilaterally  CV: Regular rate and rhythm  Breasts:  Normal appearance, no masses or tenderness, no nipple retraction or dimpling; PAIN along right side of sternum  GI: Soft, non-tender; bowel sounds normal; no masses,  no organomegaly  GU: External Genitalia:  Tanner V, no lesion Urethra:  No prolapse   Vagina: Pink, normal rugae, no blood or discharge  Cervix: No CMT, no lesion  Uterus:  Normal size and contour, non tender  Adnexa: Normal, no masses, non tender  Musculoskeletal: No edema, redness or tenderness in the calves or thighs  Skin: No  lesions or rash  Lymphatic: Axillary adenopathy: none     Psychiatric: Normal mood and behavior        Assessment:    Healthy female exam.   Right costochondritis.   Plan:    Pap with cotesting Stretches and information given on costochondritis; Celebrex bid. If not better, pt should go to PCP Colonoscopy recommended ([place]--works for [facility])

## 2019-10-24 NOTE — Progress Notes (Signed)
Mammogram scheduled for 10/26/19 Last pap 06/19/17- negative

## 2019-10-25 LAB — CYTOLOGY - PAP
Comment: NEGATIVE
Diagnosis: NEGATIVE
High risk HPV: NEGATIVE

## 2019-10-26 ENCOUNTER — Other Ambulatory Visit: Payer: Self-pay

## 2019-10-26 ENCOUNTER — Ambulatory Visit (INDEPENDENT_AMBULATORY_CARE_PROVIDER_SITE_OTHER): Payer: 59

## 2019-10-26 DIAGNOSIS — Z1231 Encounter for screening mammogram for malignant neoplasm of breast: Secondary | ICD-10-CM | POA: Diagnosis not present

## 2019-10-26 DIAGNOSIS — Z1239 Encounter for other screening for malignant neoplasm of breast: Secondary | ICD-10-CM | POA: Diagnosis not present

## 2019-12-27 DIAGNOSIS — H00024 Hordeolum internum left upper eyelid: Secondary | ICD-10-CM | POA: Diagnosis not present

## 2020-01-02 ENCOUNTER — Ambulatory Visit (INDEPENDENT_AMBULATORY_CARE_PROVIDER_SITE_OTHER): Payer: 59 | Admitting: Medical

## 2020-01-02 ENCOUNTER — Other Ambulatory Visit: Payer: Self-pay

## 2020-01-02 ENCOUNTER — Ambulatory Visit (HOSPITAL_BASED_OUTPATIENT_CLINIC_OR_DEPARTMENT_OTHER)
Admission: RE | Admit: 2020-01-02 | Discharge: 2020-01-02 | Disposition: A | Discharge status: Home / Self Care | Payer: 59 | Source: Ambulatory Visit | Attending: Medical | Admitting: Medical

## 2020-01-02 VITALS — BP 138/77 | HR 73 | Resp 18 | Ht 66.0 in | Wt 259.8 lb

## 2020-01-02 DIAGNOSIS — R0789 Other chest pain: Secondary | ICD-10-CM | POA: Diagnosis not present

## 2020-01-02 DIAGNOSIS — Z0001 Encounter for general adult medical examination with abnormal findings: Secondary | ICD-10-CM | POA: Diagnosis not present

## 2020-01-02 DIAGNOSIS — R0781 Pleurodynia: Secondary | ICD-10-CM | POA: Diagnosis not present

## 2020-01-02 DIAGNOSIS — Z0000 Encounter for general adult medical examination without abnormal findings: Secondary | ICD-10-CM

## 2020-01-02 DIAGNOSIS — R079 Chest pain, unspecified: Secondary | ICD-10-CM | POA: Diagnosis not present

## 2020-01-02 MED ORDER — METHYLPREDNISOLONE 4 MG PO TABS: ORAL_TABLET | ORAL | 0 refills | Status: DC

## 2020-01-02 MED FILL — METHYLPREDNISOLONE 4 MG TBP: 4 | 6 days supply | Qty: 21 | Fill #0

## 2020-01-02 NOTE — Progress Notes (Signed)
Subjective:    Patient ID: Lindsay Wade, female    DOB: Sep 01, 1965, 54 y.o.   MRN: [id number]  HPI  Pt is her for wellness exam/physical.  She is not fasting.  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 Pt does exercise and is on weight watchers. Pt is not smoking. No alcohol. Married.    Review of Systems  Constitutional: Negative for chills, fatigue and fever.  Respiratory: Negative for cough, chest tightness, shortness of breath and wheezing.   Cardiovascular: Negative for chest pain and palpitations.  Gastrointestinal: Negative for abdominal pain, blood in stool, constipation, diarrhea and nausea.  Musculoskeletal: Negative for back pain, joint swelling and myalgias.       Pt states she had on and off dull rt side of chest/breast pain. Pt had negative mammogram. And gyn gave celebrex for 2 weeks in may pain did not resolve. Gyn thought likely was costochondritis.   She states dull sensation all the time and on rt side.  Same constant low level faint discomfort for 6 months. No mid or left sided pain. No sob, no diaphoresis.  Neurological: Negative for dizziness, seizures and headaches.  Hematological: Negative for adenopathy. Does not bruise/bleed easily.  Psychiatric/Behavioral: Negative for behavioral problems and decreased concentration.    Past Medical History:  Diagnosis Date  . Essential hypertension 04/15/2017  . GERD (gastroesophageal reflux disease)   . Headache   . Hypothyroid   . Hypothyroidism   . MRSA infection   . OSA (obstructive sleep apnea)      Social History   Socioeconomic History  . Marital status: Married    Spouse name: Broadus John  . Number of children: 2  . Years of education: 40  . Highest education level: High school graduate  Occupational History  . Occupation: Education officer, museum    Employer: [employer]  Tobacco Use  . Smoking status: Never Smoker  . Smokeless tobacco: Never Used  Vaping Use  . Vaping Use: Never  used  Substance and Sexual Activity  . Alcohol use: Yes    Comment: social drinker; very rare   . Drug use: No  . Sexual activity: Yes    Partners: Male    Birth control/protection: Surgical  Other Topics Concern  . Not on file  Social History Narrative   Lives at home with her husband and 2 sons   Right handed   3-4 cups of caffeine daily   Social Determinants of Health   Financial Resource Strain:   . Difficulty of Paying Living Expenses:   Food Insecurity:   . Worried About Charity fundraiser in the Last Year:   . Arboriculturist in the Last Year:   Transportation Needs:   . Film/video editor (Medical):   Marland Kitchen Lack of Transportation (Non-Medical):   Physical Activity:   . Days of Exercise per Week:   . Minutes of Exercise per Session:   Stress:   . Feeling of Stress :   Social Connections:   . Frequency of Communication with Friends and Family:   . Frequency of Social Gatherings with Friends and Family:   . Attends Religious Services:   . Active Member of Clubs or Organizations:   . Attends Archivist Meetings:   Marland Kitchen Marital Status:   Intimate Partner Violence:   . Fear of Current or Ex-Partner:   . Emotionally Abused:   Marland Kitchen Physically Abused:   . Sexually Abused:     Past Surgical  History:  Procedure Laterality Date  . BTS    . CESAREAN SECTION    . TONSILLECTOMY    . uterine ablation      Family History  Problem Relation Age of Onset  . Hypertension Mother   . Diabetes Mother        TYPE 2   . Hyperlipidemia Mother   . Thyroid disease Mother   . Hypertension Father   . Parkinsonism Father   . Hyperlipidemia Father   . Stroke Father   . Hyperlipidemia Sister   . Colon cancer Neg Hx   . Stomach cancer Neg Hx   . Pancreatic cancer Neg Hx     No Known Allergies  Current Outpatient Medications on File Prior to Visit  Medication Sig Dispense Refill  . levothyroxine (SYNTHROID) 150 MCG tablet TAKE 1 TABLET BY MOUTH ONCE DAILY BEFORE  BREAKFAST 90 tablet 0  . lisinopril (ZESTRIL) 20 MG tablet TAKE 1 TABLET BY MOUTH DAILY. 90 tablet 1  . celecoxib (CELEBREX) 100 MG capsule Take 1 capsule (100 mg total) by mouth 2 (two) times daily. (Patient not taking: Reported on 01/02/2020) 28 capsule 0   No current facility-administered medications on file prior to visit.    BP 138/77 (BP Location: Right Arm, Patient Position: Sitting, Cuff Size: Large)   Pulse 73   Resp 18   Ht 5\' 6"  (1.676 m)   Wt 259 lb 12.8 oz (117.8 kg)   SpO2 100%   BMI 41.93 kg/m       Objective:   Physical Exam   General Mental Status- Alert. General Appearance- Not in acute distress.   Skin General: Color- Normal Color. Moisture- Normal Moisture.  Neck Carotid Arteries- Normal color. Moisture- Normal Moisture. No carotid bruits. No JVD.  Chest and Lung Exam Auscultation: Breath Sounds:-Normal.  Cardiovascular Auscultation:Rythm- Regular. Murmurs & Other Heart Sounds:Auscultation of the heart reveals- No Murmurs.  Abdomen Inspection:-Inspeection Normal. Palpation/Percussion:Note:No mass. Palpation and Percussion of the abdomen reveal- Non Tender, Non Distended + BS, no rebound or guarding.   Neurologic Cranial Nerve exam:- CN III-XII intact(No nystagmus), symmetric smile. Strength:- 5/5 equal and symmetric strength both upper and lower extremities.   Anterior thorax- no mid or left sided chest wall pain on palpation. Rt sided costochondral area no direct pain on palpation.(pt notes was very sore to touch before taking celebrex but still presently faintly)     Assessment & Plan:  For you wellness exam today I have ordered future cmp, cbc and lipid panel.   Referral to GI MD. I will place referral for screening colon cancer.  Recommend exercise and healthy diet.  We will let you know lab results as they come in.  Follow up date appointment will be determined after lab review.   Mackie Pai, [place]   [id number] charge as spent  extra 20 minutes discussing chest wall pain and differential dx. Treatment plan and need for follow up discussed.

## 2020-01-02 NOTE — Patient Instructions (Addendum)
For you wellness exam today I have ordered future cmp, cbc and lipid panel.   Referral to GI MD. I will place referral for screening colon cancer.  Recommend exercise and healthy diet.  We will let you know lab results as they come in.  Follow up date appointment will be determined after lab review.   For rt sides discomfort we got ekg and ordered chest xray. ekg showed sinus bradycardia. Nonspecific t waves as prior ekg.   Will offer medrol dose 6 day taper dose.   Preventive Care 26-30 Years Old, Female Preventive care refers to visits with your health care provider and lifestyle choices that can promote health and wellness. This includes:  A yearly physical exam. This may also be called an annual well check.  Regular dental visits and eye exams.  Immunizations.  Screening for certain conditions.  Healthy lifestyle choices, such as eating a healthy diet, getting regular exercise, not using drugs or products that contain nicotine and tobacco, and limiting alcohol use. What can I expect for my preventive care visit? Physical exam Your health care provider will check your:  Height and weight. This may be used to calculate body mass index (BMI), which tells if you are at a healthy weight.  Heart rate and blood pressure.  Skin for abnormal spots. Counseling Your health care provider may ask you questions about your:  Alcohol, tobacco, and drug use.  Emotional well-being.  Home and relationship well-being.  Sexual activity.  Eating habits.  Work and work Statistician.  Method of birth control.  Menstrual cycle.  Pregnancy history. What immunizations do I need?  Influenza (flu) vaccine  This is recommended every year. Tetanus, diphtheria, and pertussis (Tdap) vaccine  You may need a Td booster every 10 years. Varicella (chickenpox) vaccine  You may need this if you have not been vaccinated. Zoster (shingles) vaccine  You may need this after age  31. Measles, mumps, and rubella (MMR) vaccine  You may need at least one dose of MMR if you were born in 1957 or later. You may also need a second dose. Pneumococcal conjugate (PCV13) vaccine  You may need this if you have certain conditions and were not previously vaccinated. Pneumococcal polysaccharide (PPSV23) vaccine  You may need one or two doses if you smoke cigarettes or if you have certain conditions. Meningococcal conjugate (MenACWY) vaccine  You may need this if you have certain conditions. Hepatitis A vaccine  You may need this if you have certain conditions or if you travel or work in places where you may be exposed to hepatitis A. Hepatitis B vaccine  You may need this if you have certain conditions or if you travel or work in places where you may be exposed to hepatitis B. Haemophilus influenzae type b (Hib) vaccine  You may need this if you have certain conditions. Human papillomavirus (HPV) vaccine  If recommended by your health care provider, you may need three doses over 6 months. You may receive vaccines as individual doses or as more than one vaccine together in one shot (combination vaccines). Talk with your health care provider about the risks and benefits of combination vaccines. What tests do I need? Blood tests  Lipid and cholesterol levels. These may be checked every 5 years, or more frequently if you are over 61 years old.  Hepatitis C test.  Hepatitis B test. Screening  Lung cancer screening. You may have this screening every year starting at age 40 if you have a  30-pack-year history of smoking and currently smoke or have quit within the past 15 years.  Colorectal cancer screening. All adults should have this screening starting at age 37 and continuing until age 55. Your health care provider may recommend screening at age 27 if you are at increased risk. You will have tests every 1-10 years, depending on your results and the type of screening  test.  Diabetes screening. This is done by checking your blood sugar (glucose) after you have not eaten for a while (fasting). You may have this done every 1-3 years.  Mammogram. This may be done every 1-2 years. Talk with your health care provider about when you should start having regular mammograms. This may depend on whether you have a family history of breast cancer.  BRCA-related cancer screening. This may be done if you have a family history of breast, ovarian, tubal, or peritoneal cancers.  Pelvic exam and Pap test. This may be done every 3 years starting at age 49. Starting at age 42, this may be done every 5 years if you have a Pap test in combination with an HPV test. Other tests  Sexually transmitted disease (STD) testing.  Bone density scan. This is done to screen for osteoporosis. You may have this scan if you are at high risk for osteoporosis. Follow these instructions at home: Eating and drinking  Eat a diet that includes fresh fruits and vegetables, whole grains, lean protein, and low-fat dairy.  Take vitamin and mineral supplements as recommended by your health care provider.  Do not drink alcohol if: ? Your health care provider tells you not to drink. ? You are pregnant, may be pregnant, or are planning to become pregnant.  If you drink alcohol: ? Limit how much you have to 0-1 drink a day. ? Be aware of how much alcohol is in your drink. In the [place] one drink equals one 12 oz bottle of beer (355 mL), one 5 oz glass of wine (148 mL), or one 1 oz glass of hard liquor (44 mL). Lifestyle  Take daily care of your teeth and gums.  Stay active. Exercise for at least 30 minutes on 5 or more days each week.  Do not use any products that contain nicotine or tobacco, such as cigarettes, e-cigarettes, and chewing tobacco. If you need help quitting, ask your health care provider.  If you are sexually active, practice safe sex. Use a condom or other form of birth control  (contraception) in order to prevent pregnancy and STIs (sexually transmitted infections).  If told by your health care provider, take low-dose aspirin daily starting at age 64. What's next?  Visit your health care provider once a year for a well check visit.  Ask your health care provider how often you should have your eyes and teeth checked.  Stay up to date on all vaccines. This information is not intended to replace advice given to you by your health care provider. Make sure you discuss any questions you have with your health care provider. Document Revised: 03/04/2018 Document Reviewed: 03/04/2018 Elsevier Patient Education  2020 [employer].

## 2020-01-06 ENCOUNTER — Other Ambulatory Visit: Payer: 59

## 2020-01-13 ENCOUNTER — Other Ambulatory Visit: Payer: Self-pay | Admitting: Family Medicine

## 2020-01-13 DIAGNOSIS — R0782 Intercostal pain: Secondary | ICD-10-CM

## 2020-01-13 MED FILL — SYNTHROID 150 MCG TABLET: 150 | 90 days supply | Qty: 90 | Fill #0

## 2020-01-18 ENCOUNTER — Other Ambulatory Visit: Payer: Self-pay

## 2020-01-18 ENCOUNTER — Ambulatory Visit: Payer: 59 | Admitting: Family Medicine

## 2020-01-18 ENCOUNTER — Encounter: Payer: Self-pay | Admitting: Family Medicine

## 2020-01-18 VITALS — BP 130/70 | HR 79 | Temp 98.7°F | Ht 66.0 in | Wt 262.0 lb

## 2020-01-18 DIAGNOSIS — R0789 Other chest pain: Secondary | ICD-10-CM

## 2020-01-18 NOTE — Progress Notes (Signed)
Chief Complaint  Lindsay Wade presents with  . Follow-up    Breast pain    Subjective: Lindsay Wade is a 54 y.o. female here for f/u chest pain.  Lindsay Wade is here to follow-up for her right-sided chest pain that has been going on since December.  She thinks that when she was lifting something she strained it.  I saw her around 3 months ago and diagnosed her with a pectoral strain.  She was unable to do the stretches and exercises due to being so sore the next day.  She saw her gynecologist later that month and she received a thorough breast exam that did not yield any abn findings.  Mammogram was also unremarkable.  Her gynecologist prescribed her Celebrex that did not help.  She had a physical at the end of June with another provider and a cardiac work-up was negative including a normal EKG and chest x-ray.  She was prescribed prednisone that did not help.  She was laying over an innertube while at the pool the other day and noticed pain in both of her armpit regions.  The left side got better but the right side is still lingering and appears to be an extension of the pain in her right chest area.  She denies any bruising, swelling, growths, or shortness of breath.  She is not losing weight unintentionally.  Her biggest concern is cancer.  Past Medical History:  Diagnosis Date  . Essential hypertension 04/15/2017  . GERD (gastroesophageal reflux disease)   . Headache   . Hypothyroid   . Hypothyroidism   . MRSA infection   . OSA (obstructive sleep apnea)     Objective: BP 130/70 (BP Location: Left Arm, Lindsay Wade Position: Sitting, Cuff Size: Normal)   Pulse 79   Temp 98.7 F (37.1 C) (Oral)   Ht 5\' 6"  (1.676 m)   Wt 262 lb (118.8 kg)   SpO2 98%   BMI 42.29 kg/m  General: Awake, appears stated age MSK: +TTP in the R pectoral area extending to axillae- I do not appreciate any enlarged LN's supraclavicular or axillary. There is pain when I stretch the pec.  Heart: RRR Lungs: CTAB, no rales,  wheezes or rhonchi. No accessory muscle use Psych: Age appropriate judgment and insight, normal affect and mood  Assessment and Plan: Chest wall pain  MRI has been ordered. Not sure [place] by sports med would be helpful given presence of her breast. Stretches/exercises given again, encouraged light stretching to avoid soreness. We can be more aggressive w rehab plan if MRI neg. Ice, heat, Tylenol, NSAIDs prn.  F/u pending above.  The Lindsay Wade voiced understanding and agreement to the plan.  Greater than 30 minutes were spent face to face with the Lindsay Wade and reviewing her records with provider here and GYN with greater than 50% of this time spent counseling on potential etiologies, prognosis, management and diagnostic/tx options.   Gordon, DO 01/18/20  9:19 AM

## 2020-01-18 NOTE — Patient Instructions (Addendum)
Heat (pad or rice pillow in microwave) over affected area, 10-15 minutes twice daily.   Ice/cold pack over area for 10-15 min twice daily.  OK to take Tylenol 1000 mg (2 extra strength tabs) or 975 mg (3 regular strength tabs) every 6 hours as needed.  Ibuprofen 400-600 mg (2-3 over the counter strength tabs) every 6 hours as needed for pain.   Pectoralis Major Rehab Ask your health care provider which exercises are safe for you. Do exercises exactly as told by your health care provider and adjust them as directed. It is normal to feel mild stretching, pulling, tightness, or discomfort as you do these exercises, but you should stop right away if you feel sudden pain or your pain gets worse.Do not begin these exercises until told by your health care provider. Stretching and range of motion exercises These exercises warm up your muscles and joints and improve the movement and flexibility of your shoulder. These exercises can also help to relieve pain, numbness, and tingling. Exercise A: Pendulum  1. Stand near a wall or a surface that you can hold onto for balance. 2. Bend at the waist and let your left / right arm hang straight down. Use your other arm to keep your balance. 3. Relax your arm and shoulder muscles, and move your hips and your trunk so your left / right arm swings freely. Your arm should swing because of the motion of your body, not because you are using your arm or shoulder muscles. 4. Keep moving so your arm swings in the following directions, as told by your health care provider: ? Side to side. ? Forward and backward. ? In clockwise and counterclockwise circles. 5. Slowly return to the starting position. Repeat 2 times. Complete this exercise 3 times per week. Exercise B: Abduction, standing 1. Stand and hold a broomstick, a cane, or a similar object. Place your hands a little more than shoulder-width apart on the object. Your left / right hand should be palm-up, and your  other hand should be palm-down. 2. While keeping your elbow straight and your shoulder muscles relaxed, push the stick across your body toward your left / right side. Raise your left / right arm to the side of your body and then over your head until you feel a stretch in your shoulder. ? Stop when you reach the angle that is recommended by your health care provider. ? Avoid shrugging your shoulder while you raise your arm. Keep your shoulder blade tucked down toward the middle of your spine. 3. Hold for 10 seconds. 4. Slowly return to the starting position. Repeat 2 times. Complete this exercise 3 times per week. Exercise C: Wand flexion, supine  1. Lie on your back. You may bend your knees for comfort. 2. Hold a broomstick, a cane, or a similar object so that your hands are about shoulder-width apart on the object. Your palms should face toward your feet. 3. Raise your left / right arm in front of your face, then behind your head (toward the floor). Use your other hand to help you do this. Stop when you feel a gentle stretch in your shoulder, or when you reach the angle that is recommended by your health care provider. 4. Hold for 3 seconds. 5. Use the broomstick and your other arm to help you return your left / right arm to the starting position. Repeat 2 times. Complete this exercise 3 times per week. Exercise D: Wand shoulder external rotation 1. Stand  and hold a broomstick, a cane, or a similar object so your handsare about shoulder-width apart on the object. 2. Start with your arms hanging down, then bend both elbows to an "L" shape (90 degrees). 3. Keep your left / right elbow at your side. Use your other hand to push the stick so your left / right forearm moves away from your body, out to your side. ? Keep your left / right elbow bent to 90 degrees and keep it against your side. ? Stop when you feel a gentle stretch in your shoulder, or when you reach the angle recommended by your health  care provider. 4. Hold for 10 seconds. 5. Use the stick to help you return your left / right arm to the starting position. Repeat 2 times. Complete this exercise 3 times per week. Strengthening exercises These exercises build strength and endurance in your shoulder. Endurance is the ability to use your muscles for a long time, even after your muscles get tired. Exercise E: Scapular protraction, standing 1. Stand so you are facing a wall. Place your feet about one arm-length away from the wall. 2. Place your hands on the wall and straighten your elbows. 3. Keep your hands on the wall as you push your upper back away from the wall. You should feel your shoulder blades sliding forward.Keep your elbows and your head still. ? If you are not sure that you are doing this exercise correctly, ask your health care provider for more instructions. 4. Hold for 3 seconds. 5. Slowly return to the starting position. Let your muscles relax completely before you repeat this exercise. Repeat 2 times. Complete this exercise 3 times per week. Exercise F: Shoulder blade squeezes  (scapular retraction) 1. Sit with good posture in a stable chair. Do not let your back touch the back of the chair. 2. Your arms should be at your sides with your elbows bent. You may rest your forearms on a pillow if that is more comfortable. 3. Squeeze your shoulder blades together. Bring them down and back. ? Keep your shoulders level. ? Do not lift your shoulders up toward your ears. 4. Hold for 3 seconds. 5. Return to the starting position. Repeat 2 times. Complete this exercise 3 times per week. This information is not intended to replace advice given to you by your health care provider. Make sure you discuss any questions you have with your health care provider. Document Released: 06/23/2005 Document Revised: 04/03/2016 Document Reviewed: 03/11/2015 Elsevier Interactive Patient Education  [employer].

## 2020-02-14 ENCOUNTER — Ambulatory Visit (HOSPITAL_COMMUNITY): Payer: 59

## 2020-02-20 ENCOUNTER — Other Ambulatory Visit: Payer: Self-pay

## 2020-02-20 ENCOUNTER — Ambulatory Visit (HOSPITAL_COMMUNITY)
Admission: RE | Admit: 2020-02-20 | Discharge: 2020-02-20 | Disposition: A | Discharge status: Home / Self Care | Payer: 59 | Source: Ambulatory Visit | Attending: Family Medicine | Admitting: Family Medicine

## 2020-02-20 DIAGNOSIS — R079 Chest pain, unspecified: Secondary | ICD-10-CM | POA: Diagnosis not present

## 2020-02-20 DIAGNOSIS — R0782 Intercostal pain: Secondary | ICD-10-CM | POA: Insufficient documentation

## 2020-02-20 DIAGNOSIS — M19012 Primary osteoarthritis, left shoulder: Secondary | ICD-10-CM | POA: Diagnosis not present

## 2020-02-20 DIAGNOSIS — M19011 Primary osteoarthritis, right shoulder: Secondary | ICD-10-CM | POA: Diagnosis not present

## 2020-02-29 ENCOUNTER — Other Ambulatory Visit: Payer: Self-pay | Admitting: Family Medicine

## 2020-02-29 ENCOUNTER — Encounter: Payer: Self-pay | Admitting: Family Medicine

## 2020-02-29 ENCOUNTER — Other Ambulatory Visit: Payer: Self-pay

## 2020-02-29 ENCOUNTER — Ambulatory Visit: Payer: 59 | Admitting: Family Medicine

## 2020-02-29 VITALS — BP 128/80 | HR 84 | Temp 98.9°F | Wt 258.4 lb

## 2020-02-29 DIAGNOSIS — S29011D Strain of muscle and tendon of front wall of thorax, subsequent encounter: Secondary | ICD-10-CM

## 2020-02-29 DIAGNOSIS — F418 Other specified anxiety disorders: Secondary | ICD-10-CM | POA: Diagnosis not present

## 2020-02-29 MED ORDER — LISINOPRIL 20 MG PO TABS: 20.0000 mg | ORAL_TABLET | Freq: Every day | ORAL | 2 refills | Status: DC

## 2020-02-29 MED FILL — LISINOPRIL 20 MG TABS: 20 | 90 days supply | Qty: 90 | Fill #0

## 2020-02-29 NOTE — Progress Notes (Signed)
Chief Complaint  Patient presents with  . Follow-up    MRI results.    Subjective: Patient is a 54 y.o. female here for f/u.  Patient wanted to verify that her MRI did not show anything dangerous.  She is still having pain.  She continues to do the stretches and exercises.  She is wondering what the neck steps are.  She is not currently have much time to do physical therapy but will let us know in the future.  Patient is under a lot of stress and anxiety at work.  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  It does not sound like they have a good support system or ways to manage conflict at that office.  She is looking for another position.  She is not interested in any medication at this time.  Past Medical History:  Diagnosis Date  . Essential hypertension 04/15/2017  . GERD (gastroesophageal reflux disease)   . Headache   . Hypothyroid   . Hypothyroidism   . MRSA infection   . OSA (obstructive sleep apnea)     Objective: BP 128/80 (BP Location: Left Arm, Patient Position: Sitting, Cuff Size: Large)   Pulse 84   Temp 98.9 F (37.2 C) (Oral)   Wt 258 lb 6 oz (117.2 kg)   SpO2 97%   BMI 41.70 kg/m  General: Awake, appears stated age Lungs: No accessory muscle use Psych: Age appropriate judgment and insight, normal affect and mood  Assessment and Plan: Pectoralis muscle strain, subsequent encounter  Situational anxiety  1.  Offered physical therapy, she will let us know.  She cannot find at this time.  Continue stretches and exercises, heat, ice, Tylenol. 2.  Letter given to excuse her from work.  Also had letter request accommodations to work up front rather than in the back office where she would be exposed to that nurse more frequently. Fu for cpe at convenience. The patient voiced understanding and agreement to the plan.  [place], DO 02/29/20  12:04 PM

## 2020-02-29 NOTE — Patient Instructions (Addendum)
Let me know if anything is needed regarding your job.  Let me know if/when you would like to see physical therapy over MyChart.  Let us know if you need anything.

## 2020-05-08 ENCOUNTER — Other Ambulatory Visit: Payer: Self-pay | Admitting: Family Medicine

## 2020-05-08 DIAGNOSIS — E039 Hypothyroidism, unspecified: Secondary | ICD-10-CM

## 2020-05-16 ENCOUNTER — Other Ambulatory Visit: Payer: Self-pay | Admitting: Family Medicine

## 2020-05-16 MED FILL — SYNTHROID 150 MCG TABLET: 150 | 90 days supply | Qty: 90 | Fill #0

## 2020-06-12 ENCOUNTER — Encounter: Payer: Self-pay | Admitting: Internal Medicine

## 2020-06-27 MED FILL — LISINOPRIL 20 MG TABS: 20 | 90 days supply | Qty: 90 | Fill #1

## 2020-06-28 ENCOUNTER — Encounter: Payer: Self-pay | Admitting: Family Medicine

## 2020-06-28 ENCOUNTER — Ambulatory Visit: Payer: 59 | Admitting: Family Medicine

## 2020-06-28 ENCOUNTER — Other Ambulatory Visit: Payer: Self-pay

## 2020-06-28 VITALS — BP 146/90 | HR 75 | Temp 97.7°F | Resp 18 | Ht 66.0 in | Wt 264.4 lb

## 2020-06-28 DIAGNOSIS — R0789 Other chest pain: Secondary | ICD-10-CM | POA: Diagnosis not present

## 2020-06-28 DIAGNOSIS — R1312 Dysphagia, oropharyngeal phase: Secondary | ICD-10-CM | POA: Diagnosis not present

## 2020-06-28 DIAGNOSIS — G5602 Carpal tunnel syndrome, left upper limb: Secondary | ICD-10-CM | POA: Diagnosis not present

## 2020-06-28 NOTE — Patient Instructions (Addendum)
If you do not hear anything about your referrals in the next 1-2 weeks, call our office and ask for an update.  Wear the splint at night and during aggravated activities.  Keep the diet clean and stay active.  Monitor BP at home. If >140/90 consistently, schedule an appointment please.   Let us know if you need anything.

## 2020-06-28 NOTE — Progress Notes (Signed)
Chief Complaint  Patient presents with  . Numbness    Tingling as well .Marland Kitchen Left hand , onset: gradual   . Shoulder Pain    Right side of body .     Subjective: Patient is a 54 y.o. female here for follow-up.  Patient has had a near 1 year history of right upper chest wall pain.  She was diagnosed with a pectoral strain but did not improve with stretches/exercises.  She had an MRI of her chest that was unremarkable.  She is not having shortness of breath but does notice it when she takes a deep breath.  No redness, bruising, or swelling.  Her shoulder is unaffected.  She has a history of carpal tunnel syndrome in her left wrist.  It will frequently become numb.  She is not dropping things.  She has not tried any splinting or injections.  The patient has a near 1 year history of difficulty swallowing.  She feels that things are narrow.  She does have a history of thyroid nodules and takes thyroid medication for an underactive thyroid.  She does not feel any growth of her thyroid.  Past Medical History:  Diagnosis Date  . Essential hypertension 04/15/2017  . GERD (gastroesophageal reflux disease)   . Headache   . Hypothyroid   . Hypothyroidism   . MRSA infection   . OSA (obstructive sleep apnea)     Objective: BP (!) 146/90   Pulse 75   Temp 97.7 F (36.5 C) (Oral)   Resp 18   Ht 5\' 6"  (1.676 m)   Wt 264 lb 6.4 oz (119.9 kg)   SpO2 99%   BMI 42.68 kg/m  General: Awake, appears stated age Neck: I do not appreciate any goiter or asymmetry Heart: RRR MSK: Tenderness to palpation of the mid axillary line at approximately rib 3 and 4; no crepitus, edema, deformity, ecchymosis, erythema Lungs: CTAB, no rales, wheezes or rhonchi. No accessory muscle use Psych: Age appropriate judgment and insight, normal affect and mood  Assessment and Plan: Right-sided chest wall pain - Plan: Ambulatory referral to Sports Medicine  Carpal tunnel syndrome of left wrist  Oropharyngeal dysphagia  - Plan: Ambulatory referral to Gastroenterology  1.  Refer to sports med.  May need physical therapy. 2.  Wrist splint provided to wear at night and during aggravating activities.  Consider injection in the future. 3.  It does not feel that her thyroid is impeding her swallowing.  I will refer to gastroenterology for further evaluation.  Could consider ENT if that work-up is unremarkable. The patient voiced understanding and agreement to the plan.  [place], DO 06/28/20  10:38 AM

## 2020-07-03 ENCOUNTER — Ambulatory Visit: Payer: Self-pay

## 2020-07-03 ENCOUNTER — Ambulatory Visit: Payer: 59 | Admitting: Family Medicine

## 2020-07-03 ENCOUNTER — Other Ambulatory Visit: Payer: Self-pay

## 2020-07-03 VITALS — BP 131/90 | Ht 66.0 in | Wt 260.0 lb

## 2020-07-03 DIAGNOSIS — M659 Synovitis and tenosynovitis, unspecified: Secondary | ICD-10-CM

## 2020-07-03 DIAGNOSIS — R0789 Other chest pain: Secondary | ICD-10-CM | POA: Insufficient documentation

## 2020-07-03 NOTE — Assessment & Plan Note (Signed)
Acute on chronic in nature.  Chest x-ray was normal as well as an MRI.  Has had a normal mammogram as well.  Unclear if this is costochondritis versus nerve irritation from the cervical spine.  No specific changes on independent review of the cervical spine MRI. -Counseled on home exercise therapy and supportive care. -CK. -Could consider prednisone or gabapentin.

## 2020-07-03 NOTE — Progress Notes (Signed)
ABILENE MCPHEE - 54 y.o. female MRN [id number]  Date of birth: 1965-11-13  SUBJECTIVE:  Including CC & ROS.  No chief complaint on file.   KARIYAH BAUGH is a 54 y.o. female that is presenting with acute on chronic right-sided chest wall pain.  The pain seems deep in her chest.  She has had a mammogram and testing which is led to no specific diagnosis.  She has tried Celebrex with no improvement.  Denies any specific injury or inciting event.  Independent review of the MRI of the chest wall shows no structural abnormalities. Independent review of the chest x-ray from 6/28 shows no acute changes.  Review of Systems See HPI   HISTORY: Past Medical, Surgical, Social, and Family History Reviewed & Updated per EMR.   Pertinent Historical Findings include:  Past Medical History:  Diagnosis Date  . Essential hypertension 04/15/2017  . GERD (gastroesophageal reflux disease)   . Headache   . Hypothyroid   . Hypothyroidism   . MRSA infection   . OSA (obstructive sleep apnea)     Past Surgical History:  Procedure Laterality Date  . BTS    . CESAREAN SECTION    . TONSILLECTOMY    . uterine ablation      Family History  Problem Relation Age of Onset  . Hypertension Mother   . Diabetes Mother        TYPE 2   . Hyperlipidemia Mother   . Thyroid disease Mother   . Hypertension Father   . Parkinsonism Father   . Hyperlipidemia Father   . Stroke Father   . Hyperlipidemia Sister   . Colon cancer Neg Hx   . Stomach cancer Neg Hx   . Pancreatic cancer Neg Hx     Social History   Socioeconomic History  . Marital status: Married    Spouse name: Jomarie Longs  . Number of children: 2  . Years of education: 61  . Highest education level: High school graduate  Occupational History  . Occupation: Marine scientist    Employer: [employer]  Tobacco Use  . Smoking status: Never Smoker  . Smokeless tobacco: Never Used  Vaping Use  . Vaping Use: Never used   Substance and Sexual Activity  . Alcohol use: Yes    Comment: social drinker; very rare   . Drug use: No  . Sexual activity: Yes    Partners: Male    Birth control/protection: Surgical  Other Topics Concern  . Not on file  Social History Narrative   Lives at home with her husband and 2 sons   Right handed   3-4 cups of caffeine daily   Social Determinants of Health   Financial Resource Strain: Not on file  Food Insecurity: Not on file  Transportation Needs: Not on file  Physical Activity: Not on file  Stress: Not on file  Social Connections: Not on file  Intimate Partner Violence: Not on file     PHYSICAL EXAM:  VS: BP 131/90   Ht 5\' 6"  (1.676 m)   Wt 260 lb (117.9 kg)   BMI 41.97 kg/m  Physical Exam Gen: NAD, alert, cooperative with exam, well-appearing MSK:  Right shoulder: Normal range of motion. Normal strength resistance. No palpable defect of the chest wall. Neurovascular intact  Limited ultrasound: Right shoulder/chest:  Normal-appearing biceps tendon long and short axis. No effusion of the shoulder joint. Normal-appearing subscapularis. Effusion noted of the [facility] joint with increased hyperemia. No changes of  the right sternoclavicular joint. No changes appreciated of the pectoralis major.  Summary: Has degenerative changes appreciated of the [facility] joint.  Ultrasound and interpretation by Clearance Coots, MD    ASSESSMENT & PLAN:   Joint synovitis Has changes of the [facility] joint which could be related to an inflammatory process. -Counseled on supportive care. -Uric acid, sed rate, CRP, ANA  Chest wall pain Acute on chronic in nature.  Chest x-ray was normal as well as an MRI.  Has had a normal mammogram as well.  Unclear if this is costochondritis versus nerve irritation from the cervical spine.  No specific changes on independent review of the cervical spine MRI. -Counseled on home exercise therapy and supportive care. -CK. -Could consider prednisone  or gabapentin.

## 2020-07-03 NOTE — Patient Instructions (Signed)
Nice to meet you I will call with the results from today   Please send me a message in MyChart with any questions or updates.  Please see me back in 4 weeks.   --Dr. Jordan Likes

## 2020-07-03 NOTE — Assessment & Plan Note (Signed)
Has changes of the [facility] joint which could be related to an inflammatory process. -Counseled on supportive care. -Uric acid, sed rate, CRP, ANA

## 2020-07-05 ENCOUNTER — Telehealth: Payer: Self-pay | Admitting: Family Medicine

## 2020-07-05 LAB — ANA,IFA RA DIAG PNL W/RFLX TIT/PATN
ANA Titer 1: NEGATIVE
Cyclic Citrullin Peptide Ab: 5 units (ref 0–19)
Rheumatoid fact SerPl-aCnc: 39 IU/mL — ABNORMAL HIGH (ref <14.0)

## 2020-07-05 LAB — URIC ACID: Uric Acid: 2.9 mg/dL — ABNORMAL LOW (ref 3.0–7.2)

## 2020-07-05 LAB — CK: Total CK: 102 U/L (ref 32–182)

## 2020-07-05 LAB — SEDIMENTATION RATE: Sed Rate: 15 mm/hr (ref 0–40)

## 2020-07-05 LAB — C-REACTIVE PROTEIN: CRP: 4 mg/L (ref 0–10)

## 2020-07-05 NOTE — Telephone Encounter (Signed)
Left VM for patient. If she calls back please have her speak with a nurse/CMA and inform that her rheumatoid factor was elevated.  This could be an indication of an autoimmune process.  We could consider referral to rheumatology..   If any questions then please take the best time and phone number to call and I will try to call her back.   Myra Rude, MD [facility] 07/05/2020, 1:22 PM

## 2020-07-09 ENCOUNTER — Other Ambulatory Visit: Payer: Self-pay | Admitting: Family Medicine

## 2020-07-09 ENCOUNTER — Telehealth: Payer: Self-pay

## 2020-07-09 ENCOUNTER — Other Ambulatory Visit: Payer: Self-pay

## 2020-07-09 ENCOUNTER — Encounter: Payer: Self-pay | Admitting: Family Medicine

## 2020-07-09 MED ORDER — CYCLOBENZAPRINE HCL 10 MG PO TABS: 5.0000 mg | ORAL_TABLET | Freq: Three times a day (TID) | ORAL | 0 refills | Status: DC | PRN

## 2020-07-09 NOTE — Telephone Encounter (Signed)
Heat, ice, biofreeze.

## 2020-07-09 NOTE — Telephone Encounter (Signed)
Pt called stating she is having extreme sciatic nerve pain radiating from mid back to buttocks.  Appt was offered to pt to come in today, but she cannot make it in d/t the pain.  Appt was made for her in the morning with PCP. She is requesting a script for a muscle relaxer to be called into [facility] at [address] in [place] [address].  She would also like to know what else can be done for the pain, cold or hot compresses, etc.  Please advise.

## 2020-07-09 NOTE — Telephone Encounter (Signed)
Pt does have an appt 1/4

## 2020-07-10 ENCOUNTER — Other Ambulatory Visit: Payer: Self-pay | Admitting: Family Medicine

## 2020-07-10 ENCOUNTER — Ambulatory Visit: Payer: 59 | Admitting: Family Medicine

## 2020-07-10 ENCOUNTER — Encounter: Payer: Self-pay | Admitting: Family Medicine

## 2020-07-10 VITALS — BP 124/84 | HR 84 | Temp 99.2°F | Ht 66.0 in | Wt 264.2 lb

## 2020-07-10 DIAGNOSIS — M545 Low back pain, unspecified: Secondary | ICD-10-CM | POA: Diagnosis not present

## 2020-07-10 MED ORDER — METHYLPREDNISOLONE ACETATE 80 MG/ML IJ SUSP
80.0000 mg | Freq: Once | INTRAMUSCULAR | Status: AC
  Administered 2020-07-10: 80 mg via INTRAMUSCULAR

## 2020-07-10 MED ORDER — CYCLOBENZAPRINE HCL 10 MG PO TABS: 5.0000 mg | ORAL_TABLET | Freq: Three times a day (TID) | ORAL | 0 refills | Status: DC | PRN

## 2020-07-10 MED ORDER — MELOXICAM 15 MG PO TABS: 15.0000 mg | ORAL_TABLET | Freq: Every day | ORAL | 0 refills | Status: DC

## 2020-07-10 MED FILL — MELOXICAM 15 MG TABLET: 15 | 30 days supply | Qty: 30 | Fill #0

## 2020-07-10 MED FILL — CYCLOBENZAPRINE HCL 10 MG T: 10 | 7 days supply | Qty: 21 | Fill #0

## 2020-07-10 NOTE — Progress Notes (Signed)
Musculoskeletal Exam  Patient: Lindsay Wade DOB: 1966-02-06  DOS: 07/10/2020  SUBJECTIVE:  Chief Complaint:   Chief Complaint  Patient presents with  . Pain    Lindsay Wade is a 55 y.o.  female for evaluation and treatment of his back pain.   Onset:  3 days ago. No inj or change in activity.  Location: lower Character:  sharp  Progression of issue:  has worsened Associated symptoms: buttock pain; no bruising, redness, swelling Denies bowel/bladder incontinence or weakness Treatment: to date has been OTC NSAIDS and acetaminophen.   Neurovascular symptoms: no  Past Medical History:  Diagnosis Date  . Essential hypertension 04/15/2017  . GERD (gastroesophageal reflux disease)   . Headache   . Hypothyroid   . Hypothyroidism   . MRSA infection   . OSA (obstructive sleep apnea)     Objective:  VITAL SIGNS: BP 124/84 (BP Location: Left Arm, Patient Position: Sitting, Cuff Size: Large)   Pulse 84   Temp 99.2 F (37.3 C) (Oral)   Ht 5\' 6"  (1.676 m)   Wt 264 lb 4 oz (119.9 kg)   SpO2 98%   BMI 42.65 kg/m  Constitutional: Well formed, well developed. No acute distress. HENT: Normocephalic, atraumatic.  Thorax & Lungs:  No accessory muscle use Skin: Warm. Dry. No erythema. No rash.  Musculoskeletal: low back.   Tenderness to palpation: yes over R lumbar region and R upper glute Deformity: no Ecchymosis: no Straight leg test: negative for Poor hamstring flexibility b/l. Neurologic: Normal sensory function. No focal deficits noted. DTR's equal and symmetric in LE's. No clonus. Psychiatric: Normal mood. Age appropriate judgment and insight. Alert & oriented x 3.    Assessment:  Acute right-sided low back pain without sciatica - Plan: meloxicam (MOBIC) 15 MG tablet, cyclobenzaprine (FLEXERIL) 10 MG tablet  Plan: Orders as above. Stretches/exercises, heat, ice, Tylenol, NSAIDs as above. Warnings about Flexeril verbalized and written down. Letter for work  given to return Fri or sooner if she is feeling better.  F/u prn. The patient voiced understanding and agreement to the plan.   [place], DO 07/10/20  9:14 AM

## 2020-07-10 NOTE — Patient Instructions (Addendum)

## 2020-07-10 NOTE — Addendum Note (Signed)
Addended by: Scharlene Gloss B on: 07/10/2020 09:35 AM   Modules accepted: Orders

## 2020-07-12 ENCOUNTER — Other Ambulatory Visit: Payer: Self-pay | Admitting: Family Medicine

## 2020-07-12 DIAGNOSIS — M549 Dorsalgia, unspecified: Secondary | ICD-10-CM

## 2020-07-12 DIAGNOSIS — M545 Low back pain, unspecified: Secondary | ICD-10-CM

## 2020-07-12 MED ORDER — DULOXETINE HCL 30 MG PO CPEP: 30.0000 mg | ORAL_CAPSULE | Freq: Every day | ORAL | 3 refills | Status: DC

## 2020-07-12 MED FILL — DULoxetine HCL 30 MG CPEP: 30 | 30 days supply | Qty: 30 | Fill #0

## 2020-07-13 ENCOUNTER — Ambulatory Visit: Payer: 59

## 2020-07-16 ENCOUNTER — Telehealth: Payer: Self-pay | Admitting: Family Medicine

## 2020-07-16 NOTE — Telephone Encounter (Signed)
Document faxed to office to be filled out by provider ([employer] 6 pages with fax cover sheet) Document put at front office tray under providers name.

## 2020-07-16 NOTE — Telephone Encounter (Signed)
Received document///PCP completed///faxed

## 2020-07-18 ENCOUNTER — Encounter: Payer: Self-pay | Admitting: Orthopaedic Surgery

## 2020-07-18 ENCOUNTER — Ambulatory Visit: Payer: 59 | Admitting: Orthopaedic Surgery

## 2020-07-18 ENCOUNTER — Ambulatory Visit (INDEPENDENT_AMBULATORY_CARE_PROVIDER_SITE_OTHER): Payer: 59

## 2020-07-18 VITALS — BP 174/83 | HR 79 | Ht 66.0 in | Wt 264.0 lb

## 2020-07-18 DIAGNOSIS — M545 Low back pain, unspecified: Secondary | ICD-10-CM | POA: Diagnosis not present

## 2020-07-18 DIAGNOSIS — G8929 Other chronic pain: Secondary | ICD-10-CM | POA: Diagnosis not present

## 2020-07-18 NOTE — Progress Notes (Signed)
Office Visit Note   Patient: Lindsay Wade           Date of Birth: 09/24/1965           MRN: [id number] Visit Date: 07/18/2020              Requested by: [name], [address] [place],  [place] [address] PCP: [name], DO   Assessment & Plan: Visit Diagnoses:  1. Chronic right-sided low back pain, unspecified whether sciatica present     Plan: Work note given no work x1 week.  We will set up for some physical therapy for her back and right leg pain radiates into her buttocks and thigh.  She will continue to work on weight loss which she has noticed when she is lighter she has less pain.  Pathophysiology discussed.  Follow-Up Instructions: Return in about 6 weeks (around 08/29/2020).   Orders:  Orders Placed This Encounter  Procedures  . XR Lumbar Spine 2-3 Views  . Ambulatory referral to Physical Therapy   No orders of the defined types were placed in this encounter.     Procedures: No procedures performed   Clinical Data: No additional findings.   Subjective: Chief Complaint  Patient presents with  . Lower Back - Pain    HPI 55 year old female seen with back pain with pain that radiates to her back and the right buttocks.  She states it began [place] Day she does not recall anything specific was not active New Year's Eve.  She states she is having trouble driving difficulty laying on her back her leg gets tight.  She has had an MRI scan.  Patient is having difficult time.  She denies numbness or tingling.  She states in the past she has had some symptoms with back bothering her but nothing as severe as currently.  Past history of right-sided chest pain which was worked up negative.  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  Patient's weight is fluctuated she notes when she is heavier she has increased pain in her back and in her leg.  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  Review of Systems follows for sleep apnea morbid obesity, hypertension, hypothyroidism.  All other systems noncontributory to HPI other than as mentioned above.   Objective: Vital Signs: BP (!) 174/83   Pulse 79   Ht 5\' 6"  (1.676 m)   Wt 264 lb (119.7 kg)   BMI 42.61 kg/m   Physical Exam Constitutional:      Appearance: She is well-developed.  HENT:     Head: Normocephalic.     Right Ear: External ear normal.     Left Ear: External ear normal.  Eyes:     Pupils: Pupils are equal, round, and reactive to light.  Neck:     Thyroid: No thyromegaly.     Trachea: No tracheal deviation.  Cardiovascular:     Rate and Rhythm: Normal rate.  Pulmonary:     Effort: Pulmonary effort is normal.  Abdominal:     Palpations: Abdomen is soft.  Skin:    General: Skin is warm and dry.  Neurological:     Mental Status: She is alert and oriented to person, place, and time.  Psychiatric:        Mood and Affect: Mood and affect normal.        Behavior: Behavior  normal.     Ortho Exam patient has some pain with straight leg raising negative logroll of the hips.  Knee and ankle jerk are intact she has tenderness over sciatic notch over the right side.  Knee and ankle jerk are intact.  Anterior tib gastrocsoleus is intact.  Specialty Comments:  No specialty comments available.  Imaging: XR Lumbar Spine 2-3 Views  Result Date: 07/19/2020 AP lateral lumbar spine x-rays demonstrates minimal lumbar curvature.  Mild narrowing L5-S1 disc space with endplate spurring and facet arthropathy.  Negative for acute fracture.  No spondylolisthesis. Impression: Lumbar L5-S1 disc degeneration.    PMFS History: Patient Active Problem List   Diagnosis Date Noted  . Joint synovitis 07/03/2020  . Chest wall pain 07/03/2020  . Obstructive sleep apnea treated with BiPAP 10/06/2017  . Occipital neuralgia of right side 06/03/2017  . Uncontrolled morning headache 05/26/2017  . HTN  (hypertension), malignant 05/26/2017  . Class 2 severe obesity due to excess calories with serious comorbidity in adult ([place]) 05/26/2017  . Snoring 05/26/2017  . Occipital headache 04/24/2017  . Essential hypertension 04/15/2017  . Hypothyroid    Past Medical History:  Diagnosis Date  . Essential hypertension 04/15/2017  . GERD (gastroesophageal reflux disease)   . Headache   . Hypothyroid   . Hypothyroidism   . MRSA infection   . OSA (obstructive sleep apnea)     Family History  Problem Relation Age of Onset  . Hypertension Mother   . Diabetes Mother        TYPE 2   . Hyperlipidemia Mother   . Thyroid disease Mother   . Hypertension Father   . Parkinsonism Father   . Hyperlipidemia Father   . Stroke Father   . Hyperlipidemia Sister   . Colon cancer Neg Hx   . Stomach cancer Neg Hx   . Pancreatic cancer Neg Hx     Past Surgical History:  Procedure Laterality Date  . BTS    . CESAREAN SECTION    . TONSILLECTOMY    . uterine ablation     Social History   Occupational History  . Occupation: Education officer, museum    Employer: [employer]  Tobacco Use  . Smoking status: Never Smoker  . Smokeless tobacco: Never Used  Vaping Use  . Vaping Use: Never used  Substance and Sexual Activity  . Alcohol use: Yes    Comment: social drinker; very rare   . Drug use: No  . Sexual activity: Yes    Partners: Male    Birth control/protection: Surgical

## 2020-07-26 ENCOUNTER — Encounter: Payer: Self-pay | Admitting: Family Medicine

## 2020-07-26 ENCOUNTER — Telehealth (INDEPENDENT_AMBULATORY_CARE_PROVIDER_SITE_OTHER): Payer: 59 | Admitting: Family Medicine

## 2020-07-26 ENCOUNTER — Other Ambulatory Visit: Payer: Self-pay

## 2020-07-26 VITALS — BP 130/87 | HR 80 | Temp 98.3°F

## 2020-07-26 DIAGNOSIS — J029 Acute pharyngitis, unspecified: Secondary | ICD-10-CM

## 2020-07-26 NOTE — Progress Notes (Signed)
[place] [facility] [address], [place], [place] [address] [phone]  Date:  07/26/2020   Name:  Lindsay Wade   DOB:  [birth year]   MRN:  [id number]  PCP:  [name], DO    Chief Complaint: No chief complaint on file.   History of Present Illness:  Lindsay Wade is a 55 y.o. very pleasant female patient who presents with the following:  Pt of Dr [name]- seen today as she is accompanying her 62 year old son for his appt for severe pharyngitis  She notes headache and cough, she has not yet been tested for COVID.  She is vaccinated but has not gotten her booster We will test her for COVID-19 today History of hypothyroidism, hypertension  Patient Active Problem List   Diagnosis Date Noted  . Joint synovitis 07/03/2020  . Chest wall pain 07/03/2020  . Obstructive sleep apnea treated with BiPAP 10/06/2017  . Occipital neuralgia of right side 06/03/2017  . Uncontrolled morning headache 05/26/2017  . HTN (hypertension), malignant 05/26/2017  . Class 2 severe obesity due to excess calories with serious comorbidity in adult ([place]) 05/26/2017  . Snoring 05/26/2017  . Occipital headache 04/24/2017  . Essential hypertension 04/15/2017  . Hypothyroid     Past Medical History:  Diagnosis Date  . Essential hypertension 04/15/2017  . GERD (gastroesophageal reflux disease)   . Headache   . Hypothyroid   . Hypothyroidism   . MRSA infection   . OSA (obstructive sleep apnea)     Past Surgical History:  Procedure Laterality Date  . BTS    . CESAREAN SECTION    . TONSILLECTOMY    . uterine ablation      Social History   Tobacco Use  . Smoking status: Never Smoker  . Smokeless tobacco: Never Used  Vaping Use  . Vaping Use: Never used  Substance Use Topics  . Alcohol use: Yes    Comment: social drinker; very rare   . Drug use: No    Family History  Problem Relation Age of Onset  .  Hypertension Mother   . Diabetes Mother        TYPE 2   . Hyperlipidemia Mother   . Thyroid disease Mother   . Hypertension Father   . Parkinsonism Father   . Hyperlipidemia Father   . Stroke Father   . Hyperlipidemia Sister   . Colon cancer Neg Hx   . Stomach cancer Neg Hx   . Pancreatic cancer Neg Hx     No Known Allergies  Medication list has been reviewed and updated.  Current Outpatient Medications on File Prior to Visit  Medication Sig Dispense Refill  . cyclobenzaprine (FLEXERIL) 10 MG tablet Take 0.5-1 tablets (5-10 mg total) by mouth 3 (three) times daily as needed for muscle spasms. (Patient not taking: Reported on 07/18/2020) 21 tablet 0  . DULoxetine (CYMBALTA) 30 MG capsule Take 1 capsule (30 mg total) by mouth daily. 30 capsule 3  . levothyroxine (SYNTHROID) 150 MCG tablet TAKE 1 TABLET BY MOUTH ONCE DAILY BEFORE BREAKFAST 90 tablet 0  . lisinopril (ZESTRIL) 20 MG tablet Take 1 tablet (20 mg total) by mouth daily. 90 tablet 2  . meloxicam (MOBIC) 15 MG tablet Take 1 tablet (15 mg total) by mouth daily. (Patient not taking: Reported on 07/18/2020) 30 tablet 0   No current facility-administered medications on file prior to visit.    Review of  Systems:  As per HPI- otherwise negative.   Physical Examination: Vitals:   07/26/20 1440  BP: 130/87  Pulse: 80  Temp: 98.3 F (36.8 C)  SpO2: 97%   There were no vitals filed for this visit. There is no height or weight on file to calculate BMI. Ideal Body Weight:    GEN: no acute distress. HEENT: Atraumatic, Normocephalic.   Bilateral TM wnl, oropharynx normal.  PEERL,EOMI.   Ears and Nose: No external deformity. CV: RRR, No M/G/R. No JVD. No thrill. No extra heart sounds. PULM: CTA B, no wheezes, crackles, rhonchi. No retractions. No resp. distress. No accessory muscle use. EXTR: No c/c/e PSYCH: Normally interactive. Conversant.    Assessment and Plan: Sore throat - Plan: Novel Coronavirus, NAA  ([facility])  COVID-19 PCR pending Patient seen today while wearing appropriate PPE for potentially COVID-positive patient Her symptoms at this time are mild, we saw her and tested her today as she accompanied her son who is also ill.  I advised that I will be in touch with her labs soon as possible, she will seek care if any worsening in the meantime  Signed Lamar Blinks, MD

## 2020-07-26 NOTE — Patient Instructions (Signed)
Good to see you today- covid test is pending. Behave as though you may have covid until we know you don't!  Please seek care if not doing ok

## 2020-07-28 ENCOUNTER — Encounter: Payer: Self-pay | Admitting: Family Medicine

## 2020-07-28 LAB — NOVEL CORONAVIRUS, NAA

## 2020-09-08 ENCOUNTER — Other Ambulatory Visit: Payer: Self-pay | Admitting: Family Medicine

## 2020-09-08 MED FILL — LISINOPRIL 20 MG TABS: 20 | 90 days supply | Qty: 90 | Fill #2

## 2020-09-10 ENCOUNTER — Other Ambulatory Visit: Payer: Self-pay | Admitting: Family Medicine

## 2020-09-10 MED FILL — SYNTHROID 150 MCG TABLET: 150 | 90 days supply | Qty: 90 | Fill #0

## 2020-10-25 ENCOUNTER — Other Ambulatory Visit: Payer: Self-pay | Admitting: Family Medicine

## 2020-10-29 ENCOUNTER — Other Ambulatory Visit: Payer: Self-pay | Admitting: Family Medicine

## 2020-10-29 DIAGNOSIS — E039 Hypothyroidism, unspecified: Secondary | ICD-10-CM

## 2020-11-09 ENCOUNTER — Other Ambulatory Visit: Payer: Self-pay | Admitting: Family Medicine

## 2020-11-09 DIAGNOSIS — R0789 Other chest pain: Secondary | ICD-10-CM

## 2020-11-09 DIAGNOSIS — E039 Hypothyroidism, unspecified: Secondary | ICD-10-CM

## 2020-11-12 ENCOUNTER — Other Ambulatory Visit: Payer: Self-pay

## 2020-11-12 ENCOUNTER — Other Ambulatory Visit (INDEPENDENT_AMBULATORY_CARE_PROVIDER_SITE_OTHER): Payer: 59

## 2020-11-12 DIAGNOSIS — E039 Hypothyroidism, unspecified: Secondary | ICD-10-CM

## 2020-11-13 LAB — TSH: TSH: 0.77 u[IU]/mL (ref 0.35–4.50)

## 2020-11-27 ENCOUNTER — Encounter: Payer: Self-pay | Admitting: Internal Medicine

## 2020-11-27 ENCOUNTER — Ambulatory Visit: Payer: 59 | Admitting: Internal Medicine

## 2020-11-27 ENCOUNTER — Other Ambulatory Visit: Payer: Self-pay

## 2020-11-27 VITALS — BP 136/84 | HR 80 | Ht 66.0 in | Wt 268.5 lb

## 2020-11-27 DIAGNOSIS — R5383 Other fatigue: Secondary | ICD-10-CM

## 2020-11-27 DIAGNOSIS — E559 Vitamin D deficiency, unspecified: Secondary | ICD-10-CM

## 2020-11-27 DIAGNOSIS — E039 Hypothyroidism, unspecified: Secondary | ICD-10-CM

## 2020-11-27 DIAGNOSIS — R635 Abnormal weight gain: Secondary | ICD-10-CM | POA: Diagnosis not present

## 2020-11-27 DIAGNOSIS — E010 Iodine-deficiency related diffuse (endemic) goiter: Secondary | ICD-10-CM | POA: Diagnosis not present

## 2020-11-27 NOTE — Patient Instructions (Signed)

## 2020-11-27 NOTE — Progress Notes (Signed)
Name: Lindsay Wade  MRN/ DOB: [id number], [birth year]    Age/ Sex: 55 y.o., female    PCP: [name], DO   Reason for Endocrinology Evaluation: Hypothyroidism     Date of Initial Endocrinology Evaluation: 11/27/2020     HPI: [name] is a 56 y.o. female with a past medical history of OSA , hypothyroidism and HTN. The patient presented for initial endocrinology clinic visit on 11/27/2020 for consultative assistance with her hypothyroidism.   She has been diagnosed with hypothyroidism ~23 yrs ago secondary to Hashimoto's Thyroiditis.   She has noted local neck enlargement for the past yr as well as discomfort looking down or lying supine .  She is on Vitamin D takes it at night   Denies constipation or diarrhea  Denies palpitations  Has noted LE edema  Sleeps well at night , she is on a BIPAP machine  She has noted weight , on weight watchers      Levothyroxine 150 mcg daily  HISTORY:  Past Medical History:  Past Medical History:  Diagnosis Date  . Essential hypertension 04/15/2017  . GERD (gastroesophageal reflux disease)   . Headache   . Hypothyroid   . Hypothyroidism   . MRSA infection   . OSA (obstructive sleep apnea)    Past Surgical History:  Past Surgical History:  Procedure Laterality Date  . BTS    . CESAREAN SECTION    . TONSILLECTOMY    . uterine ablation        Social History:  reports that she has never smoked. She has never used smokeless tobacco. She reports current alcohol use. She reports that she does not use drugs.  Family History: family history includes Diabetes in her mother; Hyperlipidemia in her father, mother, and sister; Hypertension in her father and mother; Parkinsonism in her father; Stroke in her father; Thyroid disease in her mother.   HOME MEDICATIONS: Allergies as of 11/27/2020   No Known Allergies     Medication List       Accurate as of Nov 27, 2020  7:40 AM. If you have any questions,  ask your nurse or doctor.        STOP taking these medications   cyclobenzaprine 10 MG tablet Commonly known as: FLEXERIL Stopped by: Dorita Sciara, MD   DULoxetine 30 MG capsule Commonly known as: CYMBALTA Stopped by: Dorita Sciara, MD     TAKE these medications   lisinopril 20 MG tablet Commonly known as: ZESTRIL TAKE 1 TABLET (20 MG TOTAL) BY MOUTH DAILY.   Synthroid 150 MCG tablet Generic drug: levothyroxine TAKE 1 TABLET BY MOUTH ONCE DAILY BEFORE BREAKFAST         REVIEW OF SYSTEMS: A comprehensive ROS was conducted with the patient and is negative except as per HPI    OBJECTIVE:  VS: BP 136/84   Pulse 80   Ht 5\' 6"  (1.676 m)   Wt 268 lb 8 oz (121.8 kg)   SpO2 98%   BMI 43.34 kg/m    Wt Readings from Last 3 Encounters:  11/27/20 268 lb 8 oz (121.8 kg)  07/18/20 264 lb (119.7 kg)  07/10/20 264 lb 4 oz (119.9 kg)     EXAM: General: Pt appears well and is in NAD  Eyes: External eye exam normal without stare, lid lag or exophthalmos.  EOM intact.   Neck: General: Supple without adenopathy. Thyroid: Thyroid size normal.  No goiter or nodules appreciated.  No thyroid bruit.  Lungs: Clear with good BS bilat with no rales, rhonchi, or wheezes  Heart: Auscultation: RRR.  Abdomen: Normoactive bowel sounds, soft, nontender, without masses or organomegaly palpable  Extremities:  BL LE: Trace pretibial edema  Skin: Hair: Texture and amount normal with gender appropriate distribution Skin Inspection: No rashes Skin Palpation: Skin temperature, texture, and thickness normal to palpation  Neuro: Cranial nerves: II - XII grossly intact  Motor: Normal strength throughout DTRs: 2+ and symmetric in UE without delay in relaxation phase  Mental Status: Judgment, insight: Intact Orientation: Oriented to time, place, and person Mood and affect: No depression, anxiety, or agitation     DATA REVIEWED: Results for LAQUESHIA, CIHLAR (MRN [id number]) as of  11/27/2020 08:29  Ref. Range 10/05/2019 16:13 11/12/2020 16:11  TSH Latest Ref Range: 0.35 - 4.50 uIU/mL 0.04 (L) 0.77      ASSESSMENT/PLAN/RECOMMENDATIONS:   1. Hashimoto's Thyroiditis :  - She was diagnosed with Hashimoto's Thyroiditis in her 60's.  - - Pt educated extensively on the correct way to take levothyroxine (first thing in the morning with water, 30 minutes before eating or taking other medications). - Pt encouraged to double dose the following day if she were to miss a dose given long half-life of levothyroxine. - Discussed risk of cardiac arhythmia and increase bone resorption with iatrogenic   Medications : Levothyroxine 150 mcg daily   2. Thyromegaly:   - This is subjective, I was unable to feel nodules , will proceed with thyroid ultrasound  - NO nodules in 2018 on thyroid ultrasound   3. Weight gain :  - She has gained ~ 20 lb over the past 2 yrs, she is on weight watchers, admits to imperfect adherence but despite that unable to lose much weight. She had wife but pale abdominal stria and well as LE edema.  - Will screen for cushing syndrome  - Discussed avoiding sugar-sweetened beverages and avoiding snacks and eating well balanced diet  - Pt to consider aquatic exercise due to ankle pains   4. Vitamin D deficiency :  - As low as 15.7 ng/mL , she is on Vitamin D. Will check on next lab draw next month    5. Fatigue :  - Will check B12 . She is on a BIPAP machine at night    Labs net month   F/U in 4 months   Signed electronically by: Mack Guise, MD  [facility]  [facility] [address] [place], [place] [address] Phone: [phone] FAX: [phone]   CC: [name], [address] [place] [address] Phone: [phone] Fax: [phone]   Return to Endocrinology clinic as below: Future Appointments  Date Time Provider [place]  01/02/2021  8:00  AM [name], Crosby Oyster, DO [facility] [place]

## 2020-12-07 DIAGNOSIS — Z6841 Body Mass Index (BMI) 40.0 and over, adult: Secondary | ICD-10-CM | POA: Diagnosis not present

## 2020-12-07 DIAGNOSIS — M545 Low back pain, unspecified: Secondary | ICD-10-CM | POA: Diagnosis not present

## 2020-12-07 DIAGNOSIS — R768 Other specified abnormal immunological findings in serum: Secondary | ICD-10-CM | POA: Diagnosis not present

## 2020-12-07 DIAGNOSIS — M150 Primary generalized (osteo)arthritis: Secondary | ICD-10-CM | POA: Diagnosis not present

## 2020-12-07 DIAGNOSIS — M2550 Pain in unspecified joint: Secondary | ICD-10-CM | POA: Diagnosis not present

## 2020-12-07 DIAGNOSIS — R0789 Other chest pain: Secondary | ICD-10-CM | POA: Diagnosis not present

## 2020-12-10 ENCOUNTER — Other Ambulatory Visit: Payer: Self-pay | Admitting: Family Medicine

## 2020-12-10 DIAGNOSIS — Z1211 Encounter for screening for malignant neoplasm of colon: Secondary | ICD-10-CM

## 2020-12-18 ENCOUNTER — Ambulatory Visit: Payer: 59 | Admitting: Family Medicine

## 2020-12-19 ENCOUNTER — Other Ambulatory Visit: Payer: 59

## 2020-12-19 ENCOUNTER — Other Ambulatory Visit: Payer: Self-pay

## 2020-12-19 DIAGNOSIS — R635 Abnormal weight gain: Secondary | ICD-10-CM

## 2020-12-19 NOTE — Progress Notes (Signed)
Pt's spouse returned 24 hr urine specimen. Start Date:  12/18/20 @ 6:15 End Date:  12/19/20 @ 6:15 Total volume:  1934mL

## 2020-12-24 ENCOUNTER — Other Ambulatory Visit: Payer: Self-pay

## 2020-12-24 ENCOUNTER — Ambulatory Visit (HOSPITAL_BASED_OUTPATIENT_CLINIC_OR_DEPARTMENT_OTHER)
Admission: RE | Admit: 2020-12-24 | Discharge: 2020-12-24 | Disposition: A | Discharge status: Home / Self Care | Payer: 59 | Source: Ambulatory Visit | Attending: Internal Medicine | Admitting: Internal Medicine

## 2020-12-24 DIAGNOSIS — E042 Nontoxic multinodular goiter: Secondary | ICD-10-CM | POA: Diagnosis not present

## 2020-12-24 DIAGNOSIS — E010 Iodine-deficiency related diffuse (endemic) goiter: Secondary | ICD-10-CM | POA: Diagnosis not present

## 2020-12-24 DIAGNOSIS — E063 Autoimmune thyroiditis: Secondary | ICD-10-CM | POA: Diagnosis not present

## 2020-12-24 DIAGNOSIS — Z862 Personal history of diseases of the blood and blood-forming organs and certain disorders involving the immune mechanism: Secondary | ICD-10-CM | POA: Diagnosis not present

## 2020-12-24 DIAGNOSIS — E034 Atrophy of thyroid (acquired): Secondary | ICD-10-CM | POA: Diagnosis not present

## 2020-12-25 LAB — CORTISOL, URINE, 24 HOUR
24 Hour urine volume (VMAHVA): 1900 mL
CREATININE, URINE: 1.8 g/(24.h) (ref 0.50–2.15)
Cortisol (Ur), Free: 20 mcg/24 h (ref 4.0–50.0)

## 2021-01-02 ENCOUNTER — Encounter: Payer: Self-pay | Admitting: Family Medicine

## 2021-01-02 ENCOUNTER — Other Ambulatory Visit: Payer: Self-pay | Admitting: Family Medicine

## 2021-01-02 ENCOUNTER — Other Ambulatory Visit (HOSPITAL_BASED_OUTPATIENT_CLINIC_OR_DEPARTMENT_OTHER): Payer: Self-pay

## 2021-01-02 ENCOUNTER — Ambulatory Visit (INDEPENDENT_AMBULATORY_CARE_PROVIDER_SITE_OTHER): Payer: 59 | Admitting: Family Medicine

## 2021-01-02 ENCOUNTER — Other Ambulatory Visit: Payer: Self-pay

## 2021-01-02 VITALS — BP 120/80 | HR 79 | Temp 98.4°F | Ht 66.0 in | Wt 271.2 lb

## 2021-01-02 DIAGNOSIS — G8929 Other chronic pain: Secondary | ICD-10-CM

## 2021-01-02 DIAGNOSIS — Z0000 Encounter for general adult medical examination without abnormal findings: Secondary | ICD-10-CM

## 2021-01-02 DIAGNOSIS — Z1211 Encounter for screening for malignant neoplasm of colon: Secondary | ICD-10-CM | POA: Diagnosis not present

## 2021-01-02 DIAGNOSIS — K219 Gastro-esophageal reflux disease without esophagitis: Secondary | ICD-10-CM | POA: Diagnosis not present

## 2021-01-02 DIAGNOSIS — M25571 Pain in right ankle and joints of right foot: Secondary | ICD-10-CM

## 2021-01-02 DIAGNOSIS — Z1159 Encounter for screening for other viral diseases: Secondary | ICD-10-CM

## 2021-01-02 DIAGNOSIS — E785 Hyperlipidemia, unspecified: Secondary | ICD-10-CM

## 2021-01-02 LAB — COMPREHENSIVE METABOLIC PANEL
ALT: 35 U/L (ref 0–35)
AST: 29 U/L (ref 0–37)
Albumin: 4.6 g/dL (ref 3.5–5.2)
Alkaline Phosphatase: 72 U/L (ref 39–117)
BUN: 15 mg/dL (ref 6–23)
CO2: 26 mEq/L (ref 19–32)
Calcium: 9.3 mg/dL (ref 8.4–10.5)
Chloride: 105 mEq/L (ref 96–112)
Creatinine, Ser: 0.77 mg/dL (ref 0.40–1.20)
GFR: 88.55 mL/min (ref >60.00)
Glucose, Bld: 92 mg/dL (ref 70–99)
Potassium: 4 mEq/L (ref 3.5–5.1)
Sodium: 140 mEq/L (ref 135–145)
Total Bilirubin: 0.6 mg/dL (ref 0.2–1.2)
Total Protein: 7 g/dL (ref 6.0–8.3)

## 2021-01-02 LAB — TSH: TSH: 0.62 u[IU]/mL (ref 0.35–4.50)

## 2021-01-02 LAB — CBC
HCT: 39.8 % (ref 36.0–46.0)
Hemoglobin: 13.2 g/dL (ref 12.0–15.0)
MCHC: 33.2 g/dL (ref 30.0–36.0)
MCV: 89.7 fl (ref 78.0–100.0)
Platelets: 250 10*3/uL (ref 150.0–400.0)
RBC: 4.44 Mil/uL (ref 3.87–5.11)
RDW: 13.5 % (ref 11.5–15.5)
WBC: 5.6 10*3/uL (ref 4.0–10.5)

## 2021-01-02 LAB — LIPID PANEL
Cholesterol: 250 mg/dL — ABNORMAL HIGH (ref 0–200)
HDL: 57.8 mg/dL (ref >39.00)
NonHDL: 191.76
Total CHOL/HDL Ratio: 4
Triglycerides: 221 mg/dL — ABNORMAL HIGH (ref 0.0–149.0)
VLDL: 44.2 mg/dL — ABNORMAL HIGH (ref 0.0–40.0)

## 2021-01-02 LAB — LDL CHOLESTEROL, DIRECT: Direct LDL: 153 mg/dL

## 2021-01-02 LAB — HEMOGLOBIN A1C: Hgb A1c MFr Bld: 5.9 % (ref 4.6–6.5)

## 2021-01-02 MED ORDER — PANTOPRAZOLE SODIUM 40 MG PO TBEC
40.0000 mg | DELAYED_RELEASE_TABLET | Freq: Every day | ORAL | 3 refills | Status: DC
  Filled 2021-01-02: qty 30, 30d supply, fill #0

## 2021-01-02 MED ORDER — LEVOTHYROXINE SODIUM 150 MCG PO TABS
ORAL_TABLET | Freq: Every day | ORAL | 2 refills | Status: DC
  Filled 2021-01-02: qty 90, 90d supply, fill #0
  Filled 2021-03-26: qty 90, 90d supply, fill #1
  Filled 2021-07-29: qty 90, 90d supply, fill #2

## 2021-01-02 MED ORDER — MELOXICAM 7.5 MG PO TABS
7.5000 mg | ORAL_TABLET | Freq: Every day | ORAL | 0 refills | Status: DC
  Filled 2021-01-02: qty 30, 30d supply, fill #0

## 2021-01-02 NOTE — Patient Instructions (Addendum)
Give [place] 2-3 business days to get the results of your labs back.   Keep the diet clean and stay active.  For the swelling in your lower extremities, be sure to elevate your legs when able, mind the salt intake, stay physically active and consider wearing compression stockings.  The new Shingrix vaccine (for shingles) is a 2 shot series. It can make people feel low energy, achy and almost like they have the flu for 48 hours after injection. Please plan accordingly when deciding on when to get this shot. Call our office for a nurse visit appointment to get this. The second shot of the series is less severe regarding the side effects, but it still lasts 48 hours.   Let us know if you need anything.  Ankle Exercises It is normal to feel mild stretching, pulling, tightness, or discomfort as you do these exercises, but you should stop right away if you feel sudden pain or your pain gets worse.  Stretching and range of motion exercises These exercises warm up your muscles and joints and improve the movement and flexibility of your ankle. These exercises also help to relieve pain, numbness, and tingling. Exercise A: Dorsiflexion/Plantar Flexion    Sit with your affected knee straight or bent. Do not rest your foot on anything. Flex your affected ankle to tilt the top of your foot toward your shin. Hold this position for 5 seconds. Point your toes downward to tilt the top of your foot away from your shin. Hold this position for 5 seconds. Repeat 2 times. Complete this exercise 3 times per week. Exercise B: Ankle Alphabet    Sit with your affected foot supported at your lower leg. Do not rest your foot on anything. Make sure your foot has room to move freely. Think of your affected foot as a paintbrush, and move your foot to trace each letter of the alphabet in the air. Keep your hip and knee still while you trace. Make the letters as large as you can without increasing any discomfort. Trace every  letter from A to Z. Repeat 2 times. Complete this exercise 3 times per week. Strengthening exercises These exercises build strength and endurance in your ankle. Endurance is the ability to use your muscles for a long time, even after they get tired. Exercise D: Dorsiflexors    Secure a rubber exercise band or tube to an object, such as a table leg, that will stay still when the band is pulled. Secure the other end around your affected foot. Sit on the floor, facing the object with your affected leg extended. The band or tube should be slightly tense when your foot is relaxed. Slowly flex your affected ankle and toes to bring your foot toward you. Hold this position for 3 seconds.  Slowly return your foot to the starting position, controlling the band as you do that. Do a total of 10 repetitions. Repeat 2 times. Complete this exercise 3 times per week. Exercise E: Plantar Flexors    Sit on the floor with your affected leg extended. Loop a rubber exercise band or tube around the ball of your affected foot. The ball of your foot is on the walking surface, right under your toes. The band or tube should be slightly tense when your foot is relaxed. Slowly point your toes downward, pushing them away from you. Hold this position for 3 seconds. Slowly release the tension in the band or tube, controlling smoothly until your foot is back in  the starting position. Repeat for a total of 10 repetitions. Repeat 2 times. Complete this exercise 3 times per week. Exercise F: Towel Curls    Sit in a chair on a non-carpeted surface, and put your feet on the floor. Place a towel in front of your feet.  Keeping your heel on the floor, put your affected foot on the towel. Pull the towel toward you by grabbing the towel with your toes and curling them under. Keep your heel on the floor. Let your toes relax. Grab the towel again. Keep going until the towel is completely underneath your foot. Repeat for a total  of 10 repetitions. Repeat 2 times. Complete this exercise 3 times per week. Exercise G: Heel Raise ( Plantar Flexors, Standing)     Stand with your feet shoulder-width apart. Keep your weight spread evenly over the width of your feet while you rise up on your toes. Use a wall or table to steady yourself, but try not to use it for support. If this exercise is too easy, try these options: Shift your weight toward your affected leg until you feel challenged. If told by your health care provider, lift your uninjured leg off the floor. Hold this position for 3 seconds. Repeat for a total of 10 repetitions. Repeat 2 times. Complete this exercise 3 times per week. Exercise H: Tandem Walking Stand with one foot directly in front of the other. Slowly raise your back foot up, lifting your heel before your toes, and place it directly in front of your other foot. Continue to walk in this heel-to-toe way for 10 steps or for as long as told by your health care provider. Have a countertop or wall nearby to use if needed to keep your balance, but try not to hold onto anything for support. Repeat 2 times. Complete this exercises 3 times per week. Make sure you discuss any questions you have with your health care provider. Document Released: 05/07/2005 Document Revised: 02/21/2016 Document Reviewed: 03/11/2015 Elsevier Interactive Patient Education  2018 [employer].

## 2021-01-02 NOTE — Progress Notes (Addendum)
Chief Complaint  Patient presents with   Annual Exam     Well Woman Lindsay Wade is here for a complete physical.   Her last physical was >1 year ago.  Current diet: in general, diet could be better. Current exercise: walking. Weight is stable and she denies fatigue out of ordinary. Seatbelt? Yes  Health Maintenance Pap/HPV- Yes Mammogram- Yes Colon cancer screening-No-has appointment with gastroenterology later this summer Shingrix- No Tetanus- Yes Hep C screening- No HIV screening- Yes  Ankle swelling This issue has been going on for 4 months.  Bilateral ankle swelling/pain that is worse on the R. No inj or change in activity. It is hurting her when she walks.  No redness or bruising.  She had plain films done by her rheumatologist and she has an appointment on 7/12 to review that.  She has not tried anything at home so far.  GERD Patient has a history of reflux.  She uses Mylanta relatively routinely but still has burning.  It has been getting worse.  She has an appointment with gastroenterology later this summer.  She has failed Prilosec.  No unintentional weight loss, bleeding, nighttime awakenings, or fevers.  Past Medical History:  Diagnosis Date   Essential hypertension 04/15/2017   GERD (gastroesophageal reflux disease)    Headache    Hypothyroid    Hypothyroidism    MRSA infection    OSA (obstructive sleep apnea)      Past Surgical History:  Procedure Laterality Date   BTS     CESAREAN SECTION     TONSILLECTOMY     uterine ablation      Medications  Current Outpatient Medications on File Prior to Visit  Medication Sig Dispense Refill   levothyroxine (SYNTHROID) 150 MCG tablet TAKE 1 TABLET BY MOUTH ONCE DAILY BEFORE BREAKFAST 90 tablet 0   lisinopril (ZESTRIL) 20 MG tablet TAKE 1 TABLET (20 MG TOTAL) BY MOUTH DAILY. 90 tablet 2    Allergies No Known Allergies  Review of Systems: Constitutional:  no unexpected weight changes Eye:  no recent  significant change in vision Ear/Nose/Mouth/Throat:  Ears:  no recent change in hearing Nose/Mouth/Throat:  no complaints of nasal congestion, no sore throat Cardiovascular: no chest pain Respiratory:  no shortness of breath Gastrointestinal:  + Heartburn GU:  Female: negative for dysuria or pelvic pain Musculoskeletal/Extremities:  + Bilateral ankle pain Integumentary (Skin/Breast):  no abnormal skin lesions reported Neurologic:  no headaches Endocrine:  denies fatigue  Exam BP 120/80   Pulse 79   Temp 98.4 F (36.9 C) (Oral)   Ht 5\' 6"  (1.676 m)   Wt 271 lb 4 oz (123 kg)   SpO2 99%   BMI 43.78 kg/m  General:  well developed, well nourished, in no apparent distress Skin:  no significant moles, warts, or growths Head:  no masses, lesions, or tenderness Eyes:  pupils equal and round, sclera anicteric without injection Ears:  canals without lesions, TMs shiny without retraction, no obvious effusion, no erythema Nose:  nares patent, septum midline, mucosa normal, and no drainage or sinus tenderness Throat/Pharynx:  lips and gingiva without lesion; tongue and uvula midline; non-inflamed pharynx; no exudates or postnasal drainage Neck: neck supple without adenopathy, thyromegaly, or masses Lungs:  clear to auscultation, breath sounds equal bilaterally, no respiratory distress Cardio:  regular rate and rhythm, 1+ pitting bilateral lower extremity edema tapering at the distal third of the tibia Abdomen:  abdomen soft, nontender; bowel sounds normal; no masses or organomegaly  Genital: Defer to GYN Musculoskeletal: Negative squeeze and anterior drawer of the right ankle; there is moderate tenderness over the ankle mortise, ATFL, PTFL; there appears to be an effusion in the right ankle as well. Neuro:  gait normal; deep tendon reflexes normal and symmetric Psych: well oriented with normal range of affect and appropriate judgment/insight  Assessment and Plan  Well adult exam - Plan: CBC,  Comprehensive metabolic panel, Lipid panel, TSH, Hemoglobin A1c  Encounter for hepatitis C screening test for low risk patient - Plan: Hepatitis C antibody  Screen for colon cancer  Gastroesophageal reflux disease, unspecified whether esophagitis present - Plan: pantoprazole (PROTONIX) 40 MG tablet  Chronic pain of right ankle - Plan: meloxicam (MOBIC) 7.5 MG tablet   Well 55 y.o. female. Counseled on diet and exercise. Other orders as above. Chronic, uncontrolled.  She has failed Prilosec.  Start Protonix.  Reflux precautions discussed. Chronic, uncontrolled.  Low-dose meloxicam to avoid Cox inhibition of the stomach.  Stretches and exercises for the ankle provided.  She will follow-up with her rheumatologist in a couple weeks.  She will follow-up with sports medicine in a month if no improvement. Follow up in 6 mo or prn. The patient voiced understanding and agreement to the plan.  [place], DO 01/02/21 9:26 AM

## 2021-01-03 ENCOUNTER — Other Ambulatory Visit: Payer: Self-pay | Admitting: Family Medicine

## 2021-01-03 ENCOUNTER — Other Ambulatory Visit (HOSPITAL_BASED_OUTPATIENT_CLINIC_OR_DEPARTMENT_OTHER): Payer: Self-pay

## 2021-01-03 LAB — HEPATITIS C ANTIBODY
Hepatitis C Ab: NONREACTIVE
SIGNAL TO CUT-OFF: 0.01 (ref <1.00)

## 2021-01-04 ENCOUNTER — Other Ambulatory Visit (HOSPITAL_BASED_OUTPATIENT_CLINIC_OR_DEPARTMENT_OTHER): Payer: Self-pay

## 2021-01-04 MED ORDER — LISINOPRIL 20 MG PO TABS
ORAL_TABLET | Freq: Every day | ORAL | 2 refills | Status: DC
  Filled 2021-01-04: qty 90, 90d supply, fill #0
  Filled 2021-03-26: qty 90, 90d supply, fill #1
  Filled 2021-07-29: qty 90, 90d supply, fill #2

## 2021-01-05 ENCOUNTER — Telehealth: Payer: 59 | Admitting: Nurse Practitioner

## 2021-01-05 DIAGNOSIS — N3000 Acute cystitis without hematuria: Secondary | ICD-10-CM

## 2021-01-05 MED ORDER — SULFAMETHOXAZOLE-TRIMETHOPRIM 800-160 MG PO TABS: 1.0000 | ORAL_TABLET | Freq: Two times a day (BID) | ORAL | 0 refills | Status: AC

## 2021-01-05 NOTE — Progress Notes (Signed)
E-Visit for Urinary Problems  We are sorry that you are not feeling well.  Here is how we plan to help!  Based on what you shared with me it looks like you most likely have a simple urinary tract infection.  A UTI (Urinary Tract Infection) is a bacterial infection of the bladder.  Most cases of urinary tract infections are simple to treat but a key part of your care is to encourage you to drink plenty of fluids and watch your symptoms carefully.  I have prescribed Bactrim DS One tablet twice a day for 5 days.  Your symptoms should gradually improve. Call us if the burning in your urine worsens, you develop worsening fever, back pain or pelvic pain or if your symptoms do not resolve after completing the antibiotic.  Urinary tract infections can be prevented by drinking plenty of water to keep your body hydrated.  Also be sure when you wipe, wipe from front to back and don't hold it in!  If possible, empty your bladder every 4 hours.  HOME CARE Drink plenty of fluids Compete the full course of the antibiotics even if the symptoms resolve Remember, when you need to go.go. Holding in your urine can increase the likelihood of getting a UTI! GET HELP RIGHT AWAY IF: You cannot urinate You get a high fever Worsening back pain occurs You see blood in your urine You feel sick to your stomach or throw up You feel like you are going to pass out  MAKE SURE YOU  Understand these instructions. Will watch your condition. Will get help right away if you are not doing well or get worse.  Meds ordered this encounter  Medications   sulfamethoxazole-trimethoprim (BACTRIM DS) 800-160 MG tablet    Sig: Take 1 tablet by mouth 2 (two) times daily for 5 days.    Dispense:  10 tablet    Refill:  0    Thank you for choosing an e-visit. I spent approximately 5 minutes reviewing the patient's history and coordinating her care today.   Your e-visit answers were reviewed by a board certified advanced  clinical practitioner to complete your personal care plan. Depending upon the condition, your plan could have included both over the counter or prescription medications.  Please review your pharmacy choice. Make sure the pharmacy is open so you can pick up prescription now. If there is a problem, you may contact your provider through [employer] and have the prescription routed to another pharmacy.  Your safety is important to [place]. If you have drug allergies check your prescription carefully.   For the next 24 hours you can use MyChart to ask questions about today's visit, request a non-urgent call back, or ask for a work or school excuse. You will get an email in the next two days asking about your experience. I hope that your e-visit has been valuable and will speed your recovery.

## 2021-01-09 NOTE — Progress Notes (Signed)
Duplicate encounter created.

## 2021-01-15 DIAGNOSIS — M150 Primary generalized (osteo)arthritis: Secondary | ICD-10-CM | POA: Diagnosis not present

## 2021-01-15 DIAGNOSIS — R0789 Other chest pain: Secondary | ICD-10-CM | POA: Diagnosis not present

## 2021-01-15 DIAGNOSIS — Z6841 Body Mass Index (BMI) 40.0 and over, adult: Secondary | ICD-10-CM | POA: Diagnosis not present

## 2021-01-15 DIAGNOSIS — R768 Other specified abnormal immunological findings in serum: Secondary | ICD-10-CM | POA: Diagnosis not present

## 2021-01-15 DIAGNOSIS — M2550 Pain in unspecified joint: Secondary | ICD-10-CM | POA: Diagnosis not present

## 2021-02-12 ENCOUNTER — Other Ambulatory Visit: Payer: Self-pay

## 2021-02-12 ENCOUNTER — Other Ambulatory Visit (HOSPITAL_BASED_OUTPATIENT_CLINIC_OR_DEPARTMENT_OTHER): Payer: Self-pay

## 2021-02-12 ENCOUNTER — Ambulatory Visit: Payer: 59 | Admitting: Gastroenterology

## 2021-02-12 ENCOUNTER — Encounter: Payer: Self-pay | Admitting: Gastroenterology

## 2021-02-12 VITALS — BP 148/84 | HR 62 | Temp 98.1°F | Ht 66.0 in | Wt 275.0 lb

## 2021-02-12 DIAGNOSIS — R109 Unspecified abdominal pain: Secondary | ICD-10-CM

## 2021-02-12 DIAGNOSIS — K219 Gastro-esophageal reflux disease without esophagitis: Secondary | ICD-10-CM | POA: Diagnosis not present

## 2021-02-12 DIAGNOSIS — K2100 Gastro-esophageal reflux disease with esophagitis, without bleeding: Secondary | ICD-10-CM

## 2021-02-12 DIAGNOSIS — Z1211 Encounter for screening for malignant neoplasm of colon: Secondary | ICD-10-CM

## 2021-02-12 MED ORDER — DEXLANSOPRAZOLE 60 MG PO CPDR
60.0000 mg | DELAYED_RELEASE_CAPSULE | Freq: Every day | ORAL | 3 refills | Status: DC
  Filled 2021-02-12: qty 30, 30d supply, fill #0
  Filled 2021-03-26: qty 30, 30d supply, fill #1
  Filled 2021-05-08: qty 30, 30d supply, fill #2

## 2021-02-12 MED ORDER — CLENPIQ 10-3.5-12 MG-GM -GM/160ML PO SOLN
320.0000 mL | ORAL | 0 refills | Status: DC
  Filled 2021-02-12: qty 320, 1d supply, fill #0

## 2021-02-12 NOTE — Progress Notes (Signed)
Gastroenterology Consultation  Referring Provider:     [name]* Primary Care Physician:  [name], DO Primary Gastroenterologist:  Dr. Allen Norris     Reason for Consultation:     GERD and left-sided abdominal pain        HPI:   Lindsay Wade is a 55 y.o. y/o female referred for consultation & management of GERD and left-sided abdominal pain by Dr. [name], Crosby Oyster, DO.  This patient is a very pleasant woman who comes in today with some pain in the left side of her abdomen.  It is mostly associated with movement and not with eating or drinking.  She reports that the pain is not made any better or worse with moving her bowels.  She also has been on Protonix and reports that she has acid breakthrough.  She has done that her with Dexilant.  She had an upper endoscopy in the past in [place] by another gastroenterologist.   The patient reports that she is due for colonoscopy.  She also reports that she has fullness in her back and is not sure if it's related to reflux or that she need to see ENT.  The patient had the previous upper endoscopy due to the report of dysphagia which she still feels like things go down slowly.  At the time of the upper endoscopy there was no narrowing or abnormality seen in the esophagus.  There is no report of any black stools or bloody stools.  She also denies any unexplained weight loss.  Past Medical History:  Diagnosis Date   Essential hypertension 04/15/2017   GERD (gastroesophageal reflux disease)    Headache    Hypothyroid    Hypothyroidism    MRSA infection    OSA (obstructive sleep apnea)     Past Surgical History:  Procedure Laterality Date   BTS     CESAREAN SECTION     TONSILLECTOMY     uterine ablation      Prior to Admission medications   Medication Sig Start Date End Date Taking? Authorizing Provider  levothyroxine (SYNTHROID) 150 MCG tablet TAKE 1 TABLET BY MOUTH ONCE DAILY BEFORE BREAKFAST 01/02/21  01/02/22 Yes Wendling, Crosby Oyster, DO  lisinopril (ZESTRIL) 20 MG tablet Take 1 tablet by mouth daily. 01/04/21 01/04/22 Yes Wendling, Crosby Oyster, DO  pantoprazole (PROTONIX) 40 MG tablet Take 1 tablet (40 mg total) by mouth daily. 01/02/21  Yes [name], DO  meloxicam (MOBIC) 7.5 MG tablet Take 1 tablet (7.5 mg total) by mouth daily. Patient not taking: Reported on 02/12/2021 01/02/21   [name], DO    Family History  Problem Relation Age of Onset   Hypertension Mother    Diabetes Mother        TYPE 2    Hyperlipidemia Mother    Thyroid disease Mother    Hypertension Father    Parkinsonism Father    Hyperlipidemia Father    Stroke Father    Hyperlipidemia Sister    Colon cancer Neg Hx    Stomach cancer Neg Hx    Pancreatic cancer Neg Hx      Social History   Tobacco Use   Smoking status: Never   Smokeless tobacco: Never  Vaping Use   Vaping Use: Never used  Substance Use Topics   Alcohol use: Not Currently    Comment: social drinker; very rare    Drug use: No    Allergies as of 02/12/2021   (  No Known Allergies)    Review of Systems:    All systems reviewed and negative except where noted in HPI.   Physical Exam:  BP (!) 148/84 (BP Location: Left Arm, Patient Position: Sitting)   Pulse 62   Temp 98.1 F (36.7 C)   Ht '5\' 6"'$  (1.676 m)   Wt 275 lb (124.7 kg)   BMI 44.39 kg/m  No LMP recorded. Patient has had an ablation. General:   Alert,  Well-developed, well-nourished, pleasant and cooperative in NAD Head:  Normocephalic and atraumatic. Eyes:  Sclera clear, no icterus.   Conjunctiva pink. Ears:  Normal auditory acuity. Neck:  Supple; no masses or thyromegaly. Lungs:  Respirations even and unlabored.  Clear throughout to auscultation.   No wheezes, crackles, or rhonchi. No acute distress. Heart:  Regular rate and rhythm; no murmurs, clicks, rubs, or gallops. Abdomen:  Normal bowel sounds.  No bruits.  Soft, non-tender and  non-distended without masses, hepatosplenomegaly or hernias noted.  No guarding or rebound tenderness.  Negative Carnett sign.   Rectal:  Deferred.  Pulses:  Normal pulses noted. Extremities:  No clubbing or edema.  No cyanosis. Neurologic:  Alert and oriented x3;  grossly normal neurologically. Skin:  Intact without significant lesions or rashes.  No jaundice. Lymph Nodes:  No significant cervical adenopathy. Psych:  Alert and cooperative. Normal mood and affect.  Imaging Studies: No results found.  Assessment and Plan:   [name] is a 55 y.o. y/o female who comes in with left-sided abdominal pain that is not exacerbated by eating drinking or moving her bowels.  It is mostly associated with movement.  The physical exam did not show it to be obviously musculoskeletal in nature.  The patient will be set up for colonoscopy due to her not having a recent colonoscopy and she will be set up for an EGD due to her dysphagia and long-standing GERD.  The patient has been explained the plan and agrees with it.  The patient will follow up at the time of the procedures.    Lucilla Lame, MD. Marval Regal    Note: This dictation was prepared with Dragon dictation along with smaller phrase technology. Any transcriptional errors that result from this process are unintentional.

## 2021-02-13 ENCOUNTER — Other Ambulatory Visit: Payer: 59

## 2021-02-13 ENCOUNTER — Other Ambulatory Visit (HOSPITAL_BASED_OUTPATIENT_CLINIC_OR_DEPARTMENT_OTHER): Payer: Self-pay

## 2021-02-13 ENCOUNTER — Ambulatory Visit: Payer: 59 | Admitting: Family Medicine

## 2021-02-13 ENCOUNTER — Other Ambulatory Visit: Payer: Self-pay

## 2021-02-13 ENCOUNTER — Encounter: Payer: Self-pay | Admitting: Family Medicine

## 2021-02-13 VITALS — BP 142/82 | HR 83 | Temp 98.4°F | Ht 66.0 in | Wt 276.0 lb

## 2021-02-13 DIAGNOSIS — R600 Localized edema: Secondary | ICD-10-CM

## 2021-02-13 DIAGNOSIS — M542 Cervicalgia: Secondary | ICD-10-CM

## 2021-02-13 MED ORDER — FUROSEMIDE 40 MG PO TABS
40.0000 mg | ORAL_TABLET | Freq: Every day | ORAL | 3 refills | Status: DC
  Filled 2021-02-13: qty 90, 90d supply, fill #0

## 2021-02-13 NOTE — Patient Instructions (Signed)
Give us 2-3 business days to get the results of your labs back.   For the swelling in your lower extremities, be sure to elevate your legs when able, mind the salt intake, stay physically active and consider wearing compression stockings.   Let us know if you need anything. 

## 2021-02-13 NOTE — Progress Notes (Signed)
Chief Complaint  Patient presents with   Edema    Edema both feet and legs   Pain    Lindsay Wade here for bilateral foot/leg swelling.  Duration: 6 months; got worse 3 mo ago Hx of prolonged bedrest, recent surgery, travel or injury? No Pain the calf? No SOB? No Personal or family history of clot or bleeding disorder? No Hx of heart failure, renal failure, hepatic failure? No  Over past several mo   Past Medical History:  Diagnosis Date   Essential hypertension 04/15/2017   GERD (gastroesophageal reflux disease)    Headache    Hypothyroid    Hypothyroidism    MRSA infection    OSA (obstructive sleep apnea)    Family History  Problem Relation Age of Onset   Hypertension Mother    Diabetes Mother        TYPE 2    Hyperlipidemia Mother    Thyroid disease Mother    Hypertension Father    Parkinsonism Father    Hyperlipidemia Father    Stroke Father    Hyperlipidemia Sister    Colon cancer Neg Hx    Stomach cancer Neg Hx    Pancreatic cancer Neg Hx    Past Surgical History:  Procedure Laterality Date   BTS     CESAREAN SECTION     TONSILLECTOMY     uterine ablation      Current Outpatient Medications:    dexlansoprazole (DEXILANT) 60 MG capsule, Take 1 capsule (60 mg total) by mouth daily., Disp: 30 capsule, Rfl: 3   furosemide (LASIX) 40 MG tablet, Take 1 tablet (40 mg total) by mouth daily., Disp: 30 tablet, Rfl: 3   levothyroxine (SYNTHROID) 150 MCG tablet, TAKE 1 TABLET BY MOUTH ONCE DAILY BEFORE BREAKFAST, Disp: 90 tablet, Rfl: 2   lisinopril (ZESTRIL) 20 MG tablet, Take 1 tablet by mouth daily., Disp: 90 tablet, Rfl: 2   pantoprazole (PROTONIX) 40 MG tablet, Take 1 tablet (40 mg total) by mouth daily., Disp: 30 tablet, Rfl: 3   Sod Picosulfate-Mag Ox-Cit Acd (CLENPIQ) 10-3.5-12 MG-GM -GM/160ML SOLN, Take 320 mLs by mouth as directed., Disp: 320 mL, Rfl: 0  BP (!) 142/82   Pulse 83   Temp 98.4 F (36.9 C) (Oral)   Ht '5\' 6"'$  (1.676 m)   Wt 276 lb  (125.2 kg)   SpO2 98%   BMI 44.55 kg/m  Gen- awake, alert, appears stated age Heart- RRR, 2+ LE edema b/l tapering at the prox 1/3 of tibia Lungs- CTAB, normal effort w/o accessory muscle use Neck- slightly enlarged submand glands b/l, no masses appreciated, no thyromegaly MSK- no calf ttp b/l Psych: Age appropriate judgment and insight  Bilateral lower extremity edema - Plan: Comprehensive metabolic panel  Neck pain on left side - Plan: US Soft Tissue Head/Neck (NON-THYROID), CBC  Morbid obesity (HCC)  Chronic, unstable. Ck labs. Compression stockings, elevate legs, walk, mind salt intake. Trial Lasix. Warned in dependent edema, Lasix is pretty useless. May need to add PO K pending lab results.  New problem, uncertain prog. Ck [place], if anything present, will likely need to ck CT neck w contrast.  F/u prn. Pt voiced understanding and agreement to the plan.  [place], DO 02/13/21  4:27 PM

## 2021-02-14 LAB — COMPREHENSIVE METABOLIC PANEL
ALT: 25 U/L (ref 0–35)
AST: 21 U/L (ref 0–37)
Albumin: 4.2 g/dL (ref 3.5–5.2)
Alkaline Phosphatase: 66 U/L (ref 39–117)
BUN: 12 mg/dL (ref 6–23)
CO2: 26 mEq/L (ref 19–32)
Calcium: 8.9 mg/dL (ref 8.4–10.5)
Chloride: 105 mEq/L (ref 96–112)
Creatinine, Ser: 0.79 mg/dL (ref 0.40–1.20)
GFR: 85.79 mL/min (ref >60.00)
Glucose, Bld: 90 mg/dL (ref 70–99)
Potassium: 3.9 mEq/L (ref 3.5–5.1)
Sodium: 140 mEq/L (ref 135–145)
Total Bilirubin: 0.4 mg/dL (ref 0.2–1.2)
Total Protein: 7 g/dL (ref 6.0–8.3)

## 2021-02-14 LAB — CBC
HCT: 38.8 % (ref 36.0–46.0)
Hemoglobin: 13 g/dL (ref 12.0–15.0)
MCHC: 33.4 g/dL (ref 30.0–36.0)
MCV: 89 fl (ref 78.0–100.0)
Platelets: 233 10*3/uL (ref 150.0–400.0)
RBC: 4.36 Mil/uL (ref 3.87–5.11)
RDW: 13.6 % (ref 11.5–15.5)
WBC: 6.5 10*3/uL (ref 4.0–10.5)

## 2021-02-28 ENCOUNTER — Other Ambulatory Visit: Payer: Self-pay

## 2021-02-28 ENCOUNTER — Ambulatory Visit (HOSPITAL_BASED_OUTPATIENT_CLINIC_OR_DEPARTMENT_OTHER)
Admission: RE | Admit: 2021-02-28 | Discharge: 2021-02-28 | Disposition: A | Discharge status: Home / Self Care | Payer: 59 | Source: Ambulatory Visit | Attending: Family Medicine | Admitting: Family Medicine

## 2021-02-28 DIAGNOSIS — M542 Cervicalgia: Secondary | ICD-10-CM | POA: Insufficient documentation

## 2021-02-28 DIAGNOSIS — R221 Localized swelling, mass and lump, neck: Secondary | ICD-10-CM | POA: Diagnosis not present

## 2021-03-05 ENCOUNTER — Encounter: Payer: Self-pay | Admitting: Gastroenterology

## 2021-03-15 ENCOUNTER — Ambulatory Visit: Payer: 59 | Admitting: Anesthesiology

## 2021-03-15 ENCOUNTER — Encounter
Admission: RE | Disposition: A | Discharge status: Home / Self Care | Payer: Self-pay | Source: Home / Self Care | Attending: Gastroenterology

## 2021-03-15 ENCOUNTER — Ambulatory Visit
Admission: RE | Admit: 2021-03-15 | Discharge: 2021-03-15 | Disposition: A | Discharge status: Home / Self Care | Payer: 59 | Attending: Gastroenterology | Admitting: Gastroenterology

## 2021-03-15 ENCOUNTER — Encounter: Payer: Self-pay | Admitting: Gastroenterology

## 2021-03-15 ENCOUNTER — Other Ambulatory Visit: Payer: Self-pay

## 2021-03-15 DIAGNOSIS — Z79899 Other long term (current) drug therapy: Secondary | ICD-10-CM | POA: Insufficient documentation

## 2021-03-15 DIAGNOSIS — Z1211 Encounter for screening for malignant neoplasm of colon: Secondary | ICD-10-CM | POA: Diagnosis not present

## 2021-03-15 DIAGNOSIS — R1310 Dysphagia, unspecified: Secondary | ICD-10-CM | POA: Diagnosis not present

## 2021-03-15 DIAGNOSIS — K635 Polyp of colon: Secondary | ICD-10-CM | POA: Diagnosis not present

## 2021-03-15 DIAGNOSIS — K219 Gastro-esophageal reflux disease without esophagitis: Secondary | ICD-10-CM | POA: Diagnosis not present

## 2021-03-15 DIAGNOSIS — K2100 Gastro-esophageal reflux disease with esophagitis, without bleeding: Secondary | ICD-10-CM

## 2021-03-15 DIAGNOSIS — K648 Other hemorrhoids: Secondary | ICD-10-CM | POA: Insufficient documentation

## 2021-03-15 DIAGNOSIS — Z79890 Hormone replacement therapy (postmenopausal): Secondary | ICD-10-CM | POA: Diagnosis not present

## 2021-03-15 HISTORY — DX: Other specified postprocedural states: R11.2

## 2021-03-15 HISTORY — PX: COLONOSCOPY WITH PROPOFOL: SHX5780

## 2021-03-15 HISTORY — PX: ESOPHAGOGASTRODUODENOSCOPY (EGD) WITH PROPOFOL: SHX5813

## 2021-03-15 HISTORY — DX: Other specified postprocedural states: Z98.890

## 2021-03-15 SURGERY — COLONOSCOPY WITH PROPOFOL
Anesthesia: General

## 2021-03-15 MED ORDER — SODIUM CHLORIDE 0.9 % IV SOLN: INTRAVENOUS | Status: DC

## 2021-03-15 MED ORDER — ONDANSETRON HCL 4 MG/2ML IJ SOLN: 4.0000 mg | Freq: Once | INTRAMUSCULAR | Status: DC | PRN

## 2021-03-15 MED ORDER — STERILE WATER FOR IRRIGATION IR SOLN
Status: DC | PRN
  Administered 2021-03-15: 150 mL

## 2021-03-15 MED ORDER — PROPOFOL 10 MG/ML IV BOLUS
INTRAVENOUS | Status: DC | PRN
  Administered 2021-03-15 (×4): 30 mg via INTRAVENOUS
  Administered 2021-03-15: 70 mg via INTRAVENOUS
  Administered 2021-03-15 (×2): 30 mg via INTRAVENOUS

## 2021-03-15 MED ORDER — ACETAMINOPHEN 10 MG/ML IV SOLN: 1000.0000 mg | Freq: Once | INTRAVENOUS | Status: DC | PRN

## 2021-03-15 MED ORDER — LIDOCAINE HCL (CARDIAC) PF 100 MG/5ML IV SOSY
PREFILLED_SYRINGE | INTRAVENOUS | Status: DC | PRN
  Administered 2021-03-15: 60 mg via INTRAVENOUS

## 2021-03-15 MED ORDER — LACTATED RINGERS IV SOLN: INTRAVENOUS | Status: DC

## 2021-03-15 SURGICAL SUPPLY — 38 items
BALLN DILATOR 10-12 8 (BALLOONS)
BALLN DILATOR 12-15 8 (BALLOONS)
BALLN DILATOR 15-18 8 (BALLOONS) ×2
BALLN DILATOR CRE 0-12 8 (BALLOONS)
BALLN DILATOR ESOPH 8 10 CRE (MISCELLANEOUS) IMPLANT
BALLOON DILATOR 12-15 8 (BALLOONS) IMPLANT
BALLOON DILATOR 15-18 8 (BALLOONS) ×1 IMPLANT
BALLOON DILATOR CRE 0-12 8 (BALLOONS) IMPLANT
BLOCK BITE 60FR ADLT L/F GRN (MISCELLANEOUS) ×2 IMPLANT
CLIP HMST 235XBRD CATH ROT (MISCELLANEOUS) IMPLANT
CLIP RESOLUTION 360 11X235 (MISCELLANEOUS)
ELECT REM PT RETURN 9FT ADLT (ELECTROSURGICAL)
ELECTRODE REM PT RTRN 9FT ADLT (ELECTROSURGICAL) IMPLANT
FCP ESCP3.2XJMB 240X2.8X (MISCELLANEOUS)
FORCEPS BIOP RAD 4 LRG CAP 4 (CUTTING FORCEPS) ×2 IMPLANT
FORCEPS BIOP RJ4 240 W/NDL (MISCELLANEOUS)
FORCEPS ESCP3.2XJMB 240X2.8X (MISCELLANEOUS) IMPLANT
GOWN CVR UNV OPN BCK APRN NK (MISCELLANEOUS) ×2 IMPLANT
GOWN ISOL THUMB LOOP REG UNIV (MISCELLANEOUS) ×4
INJECTOR VARIJECT VIN23 (MISCELLANEOUS) IMPLANT
KIT DEFENDO VALVE AND CONN (KITS) IMPLANT
KIT PRC NS LF DISP ENDO (KITS) ×1 IMPLANT
KIT PROCEDURE OLYMPUS (KITS) ×2
MANIFOLD NEPTUNE II (INSTRUMENTS) ×2 IMPLANT
MARKER SPOT ENDO TATTOO 5ML (MISCELLANEOUS) IMPLANT
PROBE APC STR FIRE (PROBE) IMPLANT
RETRIEVER NET PLAT FOOD (MISCELLANEOUS) IMPLANT
RETRIEVER NET ROTH 2.5X230 LF (MISCELLANEOUS) IMPLANT
SNARE COLD EXACTO (MISCELLANEOUS) IMPLANT
SNARE SHORT THROW 13M SML OVAL (MISCELLANEOUS) IMPLANT
SNARE SHORT THROW 30M LRG OVAL (MISCELLANEOUS) IMPLANT
SNARE SNG USE RND 15MM (INSTRUMENTS) IMPLANT
SPOT EX ENDOSCOPIC TATTOO (MISCELLANEOUS)
SYR INFLATION 60ML (SYRINGE) ×2 IMPLANT
TRAP ETRAP POLY (MISCELLANEOUS) IMPLANT
VARIJECT INJECTOR VIN23 (MISCELLANEOUS)
WATER STERILE IRR 250ML POUR (IV SOLUTION) ×2 IMPLANT
WIRE CRE 18-20MM 8CM F G (MISCELLANEOUS) IMPLANT

## 2021-03-15 NOTE — Op Note (Signed)
[facility] Gastroenterology Patient Name: Lindsay Wade Procedure Date: 03/15/2021 9:02 AM MRN: [id number] Account #: [id number] Date of Birth: [birth year] Admit Type: Outpatient Age: 55 Room: [facility] OR ROOM 01 Gender: Female Note Status: Finalized Instrument Name: [id number] Procedure:             Colonoscopy Indications:           Screening for colorectal malignant neoplasm Providers:             Lucilla Lame MD, MD Referring MD:          [name] (Referring MD) Medicines:             Propofol per Anesthesia Complications:         No immediate complications. Procedure:             Pre-Anesthesia Assessment:                        - Prior to the procedure, a History and Physical was                         performed, and patient medications and allergies were                         reviewed. The patient's tolerance of previous                         anesthesia was also reviewed. The risks and benefits                         of the procedure and the sedation options and risks                         were discussed with the patient. All questions were                         answered, and informed consent was obtained. Prior                         Anticoagulants: The patient has taken no previous                         anticoagulant or antiplatelet agents. ASA Grade                         Assessment: II - A patient with mild systemic disease.                         After reviewing the risks and benefits, the patient                         was deemed in satisfactory condition to undergo the                         procedure.                        After obtaining informed consent, the colonoscope was  passed under direct vision. Throughout the procedure,                         the patient's blood pressure, pulse, and oxygen                         saturations were monitored continuously. The                          Colonoscope was introduced through the anus and                         advanced to the the cecum, identified by appendiceal                         orifice and ileocecal valve. The colonoscopy was                         performed without difficulty. The patient tolerated                         the procedure well. The quality of the bowel                         preparation was excellent. Findings:      The perianal and digital rectal examinations were normal.      A 5 mm polyp was found in the sigmoid colon. The polyp was sessile. The       polyp was removed with a cold snare. Resection and retrieval were       complete.      Non-bleeding internal hemorrhoids were found during retroflexion. The       hemorrhoids were Grade I (internal hemorrhoids that do not prolapse). Impression:            - One 5 mm polyp in the sigmoid colon, removed with a                         cold snare. Resected and retrieved.                        - Non-bleeding internal hemorrhoids. Recommendation:        - Discharge patient to home.                        - Resume previous diet.                        - Continue present medications.                        - Await pathology results.                        - Repeat colonoscopy in 7 years for surveillance if                         adenomatous otherwise 10 years. Procedure Code(s):     --- Professional ---  45385, Colonoscopy, flexible; with removal of                         tumor(s), polyp(s), or other lesion(s) by snare                         technique Diagnosis Code(s):     --- Professional ---                        Z12.11, Encounter for screening for malignant neoplasm                         of colon                        K63.5, Polyp of colon CPT copyright 2019 American Medical Association. All rights reserved. The codes documented in this report are preliminary and upon coder review may  be revised to meet current  compliance requirements. Lucilla Lame MD, MD 03/15/2021 9:40:31 AM This report has been signed electronically. Number of Addenda: 0 Note Initiated On: 03/15/2021 9:02 AM Scope Withdrawal Time: 0 hours 8 minutes 33 seconds  Total Procedure Duration: 0 hours 12 minutes 32 seconds  Estimated Blood Loss:  Estimated blood loss: none.      [facility]

## 2021-03-15 NOTE — Op Note (Signed)
[facility] Gastroenterology Patient Name: Lindsay Wade Procedure Date: 03/15/2021 9:03 AM MRN: [id number] Account #: [id number] Date of Birth: 1965/12/22 Admit Type: Outpatient Age: 55 Room: [facility] OR ROOM 01 Gender: Female Note Status: Finalized Instrument Name: Jaclyn Prime [id number] Procedure:             Upper GI endoscopy Indications:           Dysphagia, Heartburn Providers:             Lucilla Lame MD, MD Referring MD:          [name] (Referring MD) Medicines:             Propofol per Anesthesia Complications:         No immediate complications. Procedure:             Pre-Anesthesia Assessment:                        - Prior to the procedure, a History and Physical was                         performed, and patient medications and allergies were                         reviewed. The patient's tolerance of previous                         anesthesia was also reviewed. The risks and benefits                         of the procedure and the sedation options and risks                         were discussed with the patient. All questions were                         answered, and informed consent was obtained. Prior                         Anticoagulants: The patient has taken no previous                         anticoagulant or antiplatelet agents. ASA Grade                         Assessment: II - A patient with mild systemic disease.                         After reviewing the risks and benefits, the patient                         was deemed in satisfactory condition to undergo the                         procedure.                        After obtaining informed consent, the endoscope was  passed under direct vision. Throughout the procedure,                         the patient's blood pressure, pulse, and oxygen                         saturations were monitored continuously. The was                         introduced  through the mouth, and advanced to the                         second part of duodenum. The upper GI endoscopy was                         accomplished without difficulty. The patient tolerated                         the procedure well. Findings:      The examined esophagus was normal. Two biopsies were obtained in the       middle third of the esophagus with cold forceps for histology. A TTS       dilator was passed through the scope. Dilation with a 15-16.5-18 mm       balloon dilator was performed to 18 mm. The dilation site was examined       following endoscope reinsertion and showed complete resolution of       luminal narrowing.      The stomach was normal.      The examined duodenum was normal. Impression:            - Normal esophagus. Dilated.                        - Normal stomach.                        - Normal examined duodenum.                        - Two biopsies were obtained in the middle third of                         the esophagus. Recommendation:        - Discharge patient to home.                        - Resume previous diet.                        - Continue present medications.                        - Await pathology results.                        - Perform a colonoscopy today. Procedure Code(s):     --- Professional ---                        [phone], Esophagogastroduodenoscopy, flexible,  transoral; with transendoscopic balloon dilation of                         esophagus (less than 30 mm diameter)                        43239, 59, Esophagogastroduodenoscopy, flexible,                         transoral; with biopsy, single or multiple Diagnosis Code(s):     --- Professional ---                        R13.10, Dysphagia, unspecified CPT copyright 2019 American Medical Association. All rights reserved. The codes documented in this report are preliminary and upon coder review may  be revised to meet current compliance  requirements. Lucilla Lame MD, MD 03/15/2021 9:21:42 AM This report has been signed electronically. Number of Addenda: 0 Note Initiated On: 03/15/2021 9:03 AM Total Procedure Duration: 0 hours 5 minutes 49 seconds  Estimated Blood Loss:  Estimated blood loss: none.      [facility]

## 2021-03-15 NOTE — Anesthesia Postprocedure Evaluation (Signed)
Anesthesia Post Note  Patient: ABIJAH RIEDEL  Procedure(s) Performed: COLONOSCOPY WITH PROPOFOL ESOPHAGOGASTRODUODENOSCOPY (EGD) and dillation WITH PROPOFOL     Patient location during evaluation: PACU Anesthesia Type: General Level of consciousness: awake and alert Pain management: pain level controlled Vital Signs Assessment: post-procedure vital signs reviewed and stable Respiratory status: spontaneous breathing, nonlabored ventilation, respiratory function stable and patient connected to nasal cannula oxygen Cardiovascular status: blood pressure returned to baseline and stable Postop Assessment: no apparent nausea or vomiting Anesthetic complications: no   No notable events documented.  Raelin Pixler A  Hermann Dottavio

## 2021-03-15 NOTE — Transfer of Care (Signed)
Immediate Anesthesia Transfer of Care Note  Patient: Lindsay Wade  Procedure(s) Performed: COLONOSCOPY WITH PROPOFOL ESOPHAGOGASTRODUODENOSCOPY (EGD) and dillation WITH PROPOFOL  Patient Location: PACU  Anesthesia Type: General  Level of Consciousness: awake, alert  and patient cooperative  Airway and Oxygen Therapy: Patient Spontanous Breathing and Patient connected to supplemental oxygen  Post-op Assessment: Post-op Vital signs reviewed, Patient's Cardiovascular Status Stable, Respiratory Function Stable, Patent Airway and No signs of Nausea or vomiting  Post-op Vital Signs: Reviewed and stable  Complications: No notable events documented.

## 2021-03-15 NOTE — Anesthesia Preprocedure Evaluation (Signed)
Anesthesia Evaluation  Patient identified by MRN, date of birth, ID band Patient awake    Reviewed: Allergy & Precautions, NPO status , Patient's Chart, lab work & pertinent test results, reviewed documented beta blocker date and time   History of Anesthesia Complications (+) PONV and history of anesthetic complications  Airway Mallampati: III  TM Distance: >3 FB Neck ROM: Full    Dental   Pulmonary sleep apnea ,    breath sounds clear to auscultation       Cardiovascular hypertension, (-) angina(-) DOE  Rhythm:Regular Rate:Normal     Neuro/Psych  Headaches,    GI/Hepatic GERD  Controlled,  Endo/Other  Hypothyroidism   Renal/GU      Musculoskeletal  (+) Arthritis ,   Abdominal (+) + obese (BMI 44),   Peds  Hematology   Anesthesia Other Findings   Reproductive/Obstetrics                            Anesthesia Physical Anesthesia Plan  ASA: 3  Anesthesia Plan: General   Post-op Pain Management:    Induction: Intravenous  PONV Risk Score and Plan: 4 or greater and Propofol infusion, TIVA, Treatment may vary due to age or medical condition and Ondansetron  Airway Management Planned: Natural Airway and Nasal Cannula  Additional Equipment:   Intra-op Plan:   Post-operative Plan:   Informed Consent: I have reviewed the patients History and Physical, chart, labs and discussed the procedure including the risks, benefits and alternatives for the proposed anesthesia with the patient or authorized representative who has indicated his/her understanding and acceptance.       Plan Discussed with: CRNA and Anesthesiologist  Anesthesia Plan Comments:         Anesthesia Quick Evaluation

## 2021-03-15 NOTE — H&P (Signed)
Lindsay Lame, MD Oliver[address] Lindsay Wade, Lindsay Wade Phone:[phone] Fax : [phone]  Primary Care Physician:  Lindsay Pal, DO Primary Gastroenterologist:  Dr. Allen Wade  Pre-Procedure History & Physical: HPI:  Lindsay Wade is a 55 y.o. female is here for an endoscopy and colonoscopy.   Past Medical History:  Diagnosis Date   Essential hypertension 04/15/2017   GERD (gastroesophageal reflux disease)    Headache    Hypothyroid    Hypothyroidism    MRSA infection    OSA (obstructive sleep apnea)    CPAP   PONV (postoperative nausea and vomiting)    after tonsillectomy    Past Surgical History:  Procedure Laterality Date   BTS     CESAREAN SECTION     TONSILLECTOMY     uterine ablation  2005    Prior to Admission medications   Medication Sig Start Date End Date Taking? Authorizing Provider  dexlansoprazole (DEXILANT) 60 MG capsule Take 1 capsule (60 mg total) by mouth daily. 02/12/21  Yes Lindsay Lame, MD  furosemide (LASIX) 40 MG tablet Take 1 tablet (40 mg total) by mouth daily. Patient taking differently: Take 40 mg by mouth daily. Takes as needed 02/13/21  Yes Lindsay Wade, Lindsay Oyster, DO  levothyroxine (SYNTHROID) 150 MCG tablet TAKE 1 TABLET BY MOUTH ONCE DAILY BEFORE BREAKFAST 01/02/21 01/02/22 Yes Lindsay Wade, Lindsay Oyster, DO  lisinopril (ZESTRIL) 20 MG tablet Take 1 tablet by mouth daily. 01/04/21 01/04/22 Yes Lindsay Wade, Lindsay Oyster, DO  pantoprazole (PROTONIX) 40 MG tablet Take 1 tablet (40 mg total) by mouth daily. Patient not taking: Reported on 03/05/2021 01/02/21   Lindsay Pal, DO  Sod Picosulfate-Mag Ox-Cit Acd (CLENPIQ) 10-3.5-12 MG-GM -GM/160ML SOLN Take 320 mLs by mouth as directed. 02/12/21   Lindsay Lame, MD    Allergies as of 02/12/2021   (No Known Allergies)    Family History  Problem Relation Age of Onset   Hypertension Mother    Diabetes Mother        TYPE 2    Hyperlipidemia Mother    Thyroid disease  Mother    Hypertension Father    Parkinsonism Father    Hyperlipidemia Father    Stroke Father    Hyperlipidemia Sister    Colon cancer Neg Hx    Stomach cancer Neg Hx    Pancreatic cancer Neg Hx     Social History   Socioeconomic History   Marital status: Married    Spouse name: Lindsay Wade   Number of children: 2   Years of education: 12   Highest education level: High school graduate  Occupational History   Occupation: Chartered certified accountant: Lindsay Wade  Tobacco Use   Smoking status: Never   Smokeless tobacco: Never  Vaping Use   Vaping Use: Never used  Substance and Sexual Activity   Alcohol use: Not Currently    Comment: social drinker; very rare    Drug use: No   Sexual activity: Yes    Partners: Male    Birth control/protection: Surgical  Other Topics Concern   Not on file  Social History Narrative   Lives at home with her husband and 2 sons   Right handed   3-4 cups of caffeine daily   Social Determinants of Health   Financial Resource Strain: Not on file  Food Insecurity: Not on file  Transportation Needs: Not on file  Physical Activity: Not on file  Stress: Not on file  Social Connections: Not on file  Intimate Partner Violence: Not on file    Review of Systems: See HPI, otherwise negative ROS  Physical Exam: BP 137/88   Pulse 71   Temp (!) 97.1 F (36.2 C) (Temporal)   Resp 16   Ht '5\' 6"'$  (1.676 m)   Wt 122.9 kg   SpO2 99%   BMI 43.74 kg/m  General:   Alert,  pleasant and cooperative in NAD Head:  Normocephalic and atraumatic. Neck:  Supple; no masses or thyromegaly. Lungs:  Clear throughout to auscultation.    Heart:  Regular rate and rhythm. Abdomen:  Soft, nontender and nondistended. Normal bowel sounds, without guarding, and without rebound.   Neurologic:  Alert and  oriented x4;  grossly normal neurologically.  Impression/Plan: Lindsay Wade is here for an endoscopy and colonoscopy to be performed for  Dysphagia and screening  Risks, benefits, limitations, and alternatives regarding  endoscopy and colonoscopy have been reviewed with the patient.  Questions have been answered.  All parties agreeable.   Lindsay Lame, MD  03/15/2021, 8:25 AM

## 2021-03-18 ENCOUNTER — Encounter: Payer: Self-pay | Admitting: Gastroenterology

## 2021-03-18 LAB — SURGICAL PATHOLOGY

## 2021-03-19 ENCOUNTER — Encounter: Payer: Self-pay | Admitting: Neurology

## 2021-03-19 ENCOUNTER — Other Ambulatory Visit: Payer: Self-pay

## 2021-03-19 ENCOUNTER — Ambulatory Visit: Payer: 59 | Admitting: Neurology

## 2021-03-19 VITALS — BP 132/79 | HR 82 | Ht 66.0 in | Wt 270.0 lb

## 2021-03-19 DIAGNOSIS — G4733 Obstructive sleep apnea (adult) (pediatric): Secondary | ICD-10-CM

## 2021-03-19 DIAGNOSIS — R519 Headache, unspecified: Secondary | ICD-10-CM

## 2021-03-19 DIAGNOSIS — R0683 Snoring: Secondary | ICD-10-CM | POA: Diagnosis not present

## 2021-03-19 NOTE — Patient Instructions (Signed)
CPAP and BPAP Information CPAP and BPAP (also called BiPAP) are methods that use air pressure to keep your airways open and to help you breathe well. CPAP and BPAP use different amounts of pressure. Your health care provider will tell you whether CPAP or BPAP would be more helpful for you. CPAP stands for "continuous positive airway pressure." With CPAP, the amount of pressure stays the same while you breathe in (inhale) and out (exhale). BPAP stands for "bi-level positive airway pressure." With BPAP, the amount of pressure will be higher when you inhale and lower when you exhale. This allows you to take larger breaths. CPAP or BPAP may be used in the hospital, or your health care provider may want you to use it at home. You may need to have a sleep study before your health care provider can order a machine for you to use at home. What are the advantages? CPAP or BPAP can be helpful if you have: Sleep apnea. Chronic obstructive pulmonary disease (COPD). Heart failure. Medical conditions that cause muscle weakness, including muscular dystrophy or amyotrophic lateral sclerosis (ALS). Other problems that cause breathing to be shallow, weak, abnormal, or difficult. CPAP and BPAP are most commonly used for obstructive sleep apnea (OSA) to keep the airways from collapsing when the muscles relax during sleep. What are the risks? Generally, this is a safe treatment. However, problems may occur, including: Irritated skin or skin sores if the mask does not fit properly. Dry or stuffy nose or nosebleeds. Dry mouth. Feeling gassy or bloated. Sinus or lung infection if the equipment is not cleaned properly. When should CPAP or BPAP be used? In most cases, the mask only needs to be worn during sleep. Generally, the mask needs to be worn throughout the night and during any daytime naps. People with certain medical conditions may also need to wear the mask at other times, such as when they are awake. Follow  instructions from your health care provider about when to use the machine. What happens during CPAP or BPAP? Both CPAP and BPAP are provided by a small machine with a flexible plastic tube that attaches to a plastic mask that you wear. Air is blown through the mask into your nose or mouth. The amount of pressure that is used to blow the air can be adjusted on the machine. Your health care provider will set the pressure setting and help you find the best mask for you. Tips for using the mask Because the mask needs to be snug, some people feel trapped or closed-in (claustrophobic) when first using the mask. If you feel this way, you may need to get used to the mask. One way to do this is to hold the mask loosely over your nose or mouth and then gradually apply the mask more snugly. You can also gradually increase the amount of time that you use the mask. Masks are available in various types and sizes. If your mask does not fit well, talk with your health care provider about getting a different one. Some common types of masks include: Full face masks, which fit over the mouth and nose. Nasal masks, which fit over the nose. Nasal pillow or prong masks, which fit into the nostrils. If you are using a mask that fits over your nose and you tend to breathe through your mouth, a chin strap may be applied to help keep your mouth closed. Use a skin barrier to protect your skin as told by your health care provider.   Some CPAP and BPAP machines have alarms that may sound if the mask comes off or develops a leak. If you have trouble with the mask, it is very important that you talk with your health care provider about finding a way to make the mask easier to tolerate. Do not stop using the mask. There could be a negative impact on your health if you stop using the mask. Tips for using the machine Place your CPAP or BPAP machine on a secure table or stand near an electrical outlet. Know where the on/off switch is on  the machine. Follow instructions from your health care provider about how to set the pressure on your machine and when you should use it. Do not eat or drink while the CPAP or BPAP machine is on. Food or fluids could get pushed into your lungs by the pressure of the CPAP or BPAP. For home use, CPAP and BPAP machines can be rented or purchased through home health care companies. Many different brands of machines are available. Renting a machine before purchasing may help you find out which particular machine works well for you. Your health insurance company may also decide which machine you may get. Keep the CPAP or BPAP machine and attachments clean. Ask your health care provider for specific instructions. Check the humidifier if you have a dry stuffy nose or nosebleeds. Make sure it is working correctly. Follow these instructions at home: Take over-the-counter and prescription medicines only as told by your health care provider. Ask if you can take sinus medicine if your sinuses are blocked. Do not use any products that contain nicotine or tobacco. These products include cigarettes, chewing tobacco, and vaping devices, such as e-cigarettes. If you need help quitting, ask your health care provider. Keep all follow-up visits. This is important. Contact a health care provider if: You have redness or pressure sores on your head, face, mouth, or nose from the mask or head gear. You have trouble using the CPAP or BPAP machine. You cannot tolerate wearing the CPAP or BPAP mask. Someone tells you that you snore even when wearing your CPAP or BPAP. Get help right away if: You have trouble breathing. You feel confused. Summary CPAP and BPAP are methods that use air pressure to keep your airways open and to help you breathe well. If you have trouble with the mask, it is very important that you talk with your health care provider about finding a way to make the mask easier to tolerate. Do not stop using the  mask. There could be a negative impact to your health if you stop using the mask. Follow instructions from your health care provider about when to use the machine. This information is not intended to replace advice given to you by your health care provider. Make sure you discuss any questions you have with your health care provider. Document Revised: 06/01/2020 Document Reviewed: 06/01/2020 Elsevier Patient Education  2022 Story Injection What is this medication? SEMAGLUTIDE (SEM a GLOO tide) treats type 2 diabetes. It works by increasing insulin levels in your body, which decreases your blood sugar (glucose). It also reduces the amount of sugar released into the blood and slows down your digestion. It can also be used to lower the risk of heart attack and stroke in people with type 2 diabetes. Changes to diet and exercise are often combined with this medication. This medicine may be used for other purposes; ask your health care provider or pharmacist if you  have questions. COMMON BRAND NAME(S): OZEMPIC What should I tell my care team before I take this medication? They need to know if you have any of these conditions: Endocrine tumors (MEN 2) or if someone in your family had these tumors Eye disease, vision problems History of pancreatitis Kidney disease Stomach problems Thyroid cancer or if someone in your family had thyroid cancer An unusual or allergic reaction to semaglutide, other medications, foods, dyes, or preservatives Pregnant or trying to get pregnant Breast-feeding How should I use this medication? This medication is for injection under the skin of your upper leg (thigh), stomach area, or upper arm. It is given once every week (every 7 days). You will be taught how to prepare and give this medication. Use exactly as directed. Take your medication at regular intervals. Do not take it more often than directed. If you use this medication with insulin, you should  inject this medication and the insulin separately. Do not mix them together. Do not give the injections right next to each other. Change (rotate) injection sites with each injection. It is important that you put your used needles and syringes in a special sharps container. Do not put them in a trash can. If you do not have a sharps container, call your pharmacist or care team to get one. A special MedGuide will be given to you by the pharmacist with each prescription and refill. Be sure to read this information carefully each time. This medication comes with INSTRUCTIONS FOR USE. Ask your pharmacist for directions on how to use this medication. Read the information carefully. Talk to your pharmacist or care team if you have questions. Talk to your care team about the use of this medication in children. Special care may be needed. Overdosage: If you think you have taken too much of this medicine contact a poison control center or emergency room at once. NOTE: This medicine is only for you. Do not share this medicine with others. What if I miss a dose? If you miss a dose, take it as soon as you can within 5 days after the missed dose. Then take your next dose at your regular weekly time. If it has been longer than 5 days after the missed dose, do not take the missed dose. Take the next dose at your regular time. Do not take double or extra doses. If you have questions about a missed dose, contact your care team for advice. What may interact with this medication? Other medications for diabetes Many medications may cause changes in blood sugar, these include: Alcohol containing beverages Antiviral medications for HIV or AIDS Aspirin and aspirin-like medications Certain medications for blood pressure, heart disease, irregular heart beat Chromium Diuretics Female hormones, such as estrogens or progestins, birth control pills Fenofibrate Gemfibrozil Isoniazid Lanreotide Female hormones or anabolic  steroids MAOIs like Carbex, Eldepryl, Marplan, Nardil, and Parnate Medications for weight loss Medications for allergies, asthma, cold, or cough Medications for depression, anxiety, or psychotic disturbances Niacin Nicotine NSAIDs, medications for pain and inflammation, like ibuprofen or naproxen Octreotide Pasireotide Pentamidine Phenytoin Probenecid Quinolone antibiotics such as ciprofloxacin, levofloxacin, ofloxacin Some herbal dietary supplements Steroid medications such as prednisone or cortisone Sulfamethoxazole; trimethoprim Thyroid hormones Some medications can hide the warning symptoms of low blood sugar (hypoglycemia). You may need to monitor your blood sugar more closely if you are taking one of these medications. These include: Beta-blockers, often used for high blood pressure or heart problems (examples include atenolol, metoprolol, propranolol) Clonidine Guanethidine Reserpine  This list may not describe all possible interactions. Give your health care provider a list of all the medicines, herbs, non-prescription drugs, or dietary supplements you use. Also tell them if you smoke, drink alcohol, or use illegal drugs. Some items may interact with your medicine. What should I watch for while using this medication? Visit your care team for regular checks on your progress. Drink plenty of fluids while taking this medication. Check with your care team if you get an attack of severe diarrhea, nausea, and vomiting. The loss of too much body fluid can make it dangerous for you to take this medication. A test called the HbA1C (A1C) will be monitored. This is a simple blood test. It measures your blood sugar control over the last 2 to 3 months. You will receive this test every 3 to 6 months. Learn how to check your blood sugar. Learn the symptoms of low and high blood sugar and how to manage them. Always carry a quick-source of sugar with you in case you have symptoms of low blood  sugar. Examples include hard sugar candy or glucose tablets. Make sure others know that you can choke if you eat or drink when you develop serious symptoms of low blood sugar, such as seizures or unconsciousness. They must get medical help at once. Tell your care team if you have high blood sugar. You might need to change the dose of your medication. If you are sick or exercising more than usual, you might need to change the dose of your medication. Do not skip meals. Ask your care team if you should avoid alcohol. Many nonprescription cough and cold products contain sugar or alcohol. These can affect blood sugar. Pens should never be shared. Even if the needle is changed, sharing may result in passing of viruses like hepatitis or HIV. Wear a medical ID bracelet or chain, and carry a card that describes your disease and details of your medication and dosage times. Do not become pregnant while taking this medication. Women should inform their care team if they wish to become pregnant or think they might be pregnant. There is a potential for serious side effects to an unborn child. Talk to your care team for more information. What side effects may I notice from receiving this medication? Side effects that you should report to your care team as soon as possible: Allergic reactions-skin rash, itching, hives, swelling of the face, lips, tongue, or throat Change in vision Dehydration-increased thirst, dry mouth, feeling faint or lightheaded, headache, dark yellow or brown urine Gallbladder problems-severe stomach pain, nausea, vomiting, fever Heart palpitations-rapid, pounding, or irregular heartbeat Kidney injury-decrease in the amount of urine, swelling of the ankles, hands, or feet Pancreatitis-severe stomach pain that spreads to your back or gets worse after eating or when touched, fever, nausea, vomiting Thyroid cancer-new mass or lump in the neck, pain or trouble swallowing, trouble breathing,  hoarseness Side effects that usually do not require medical attention (report to your care team if they continue or are bothersome): Diarrhea Loss of appetite Nausea Stomach pain Vomiting This list may not describe all possible side effects. Call your doctor for medical advice about side effects. You may report side effects to FDA at 1-800-FDA-1088. Where should I keep my medication? Keep out of the reach of children. Store unopened pens in a refrigerator between 2 and 8 degrees C (36 and 46 degrees F). Do not freeze. Protect from light and heat. After you first use the pen, it can  be stored for 56 days at room temperature between 15 and 30 degrees C (59 and 86 degrees F) or in a refrigerator. Throw away your used pen after 56 days or after the expiration date, whichever comes first. Do not store your pen with the needle attached. If the needle is left on, medication may leak from the pen. NOTE: This sheet is a summary. It may not cover all possible information. If you have questions about this medicine, talk to your doctor, pharmacist, or health care provider.  2022 Elsevier/Gold Standard (2020-08-20 16:01:53)

## 2021-03-19 NOTE — Progress Notes (Signed)
SLEEP MEDICINE CLINIC   Provider:  Larey Seat, M D  Primary Care Physician:  Lindsay Pal, DO   Lindsay Wade M.D.   Chief Complaint  Patient presents with   Obstructive Sleep Apnea    Rm 10, alone. Here for CPAP f/u. Pt has been told by husband that she has been snoring through her CPAP. Pt has gained weight. She has been having more events though the night and may need more pressure.     HPI:  Lindsay Wade is a married, right handed 55 y.o. female here for a follow up after 3 years of absence. He patient was diagnosed with sleep apnea.  Her headaches and BP problem resolved soon after BiPAP was initiated.   The patient underwent on 12-28 2018 a split-night polysomnography study.  The baseline study showed a latency of only 9 minutes before she entered sleep and she had a sufficient sleep efficiency she could not enter REM sleep however.  There was no N3 stage sleep either there were 117 respiratory events with an AHI of 61.9/h so this was severe sleep apnea and the vast majority of her events were hypopneas.  In supine sleep there was actually less AHI is seen then in nonsupine sleep.  And her non-REM AHI was 61.9.  She was initiated to CPAP with an initial pressure of 5 cmH2O at the highest CPAP level still had a residual AHI so the technician changed her that night to a BiPAP the best result was seen at 16/12 cmH2O and she reached a sleep efficiency of 91.7%.  The patient switched from a nasal interface to a full facemask with nasal pillow she did not have periodic limb movements at night.  She did not have hypoxia after CPAP was initiated.  Almost 4 years later she is actually doing very well she has seen our nurse practitioners Gilford Raid and Amy Lomax in the interval.  And has been highly compliant with CPAP.  She is concerned now because her husband has noticed that she snores during the mask.  She also noted that when she changed headgear there was a better  seal and a lower AHI indicated in the mornings however at this time she still 97% compliant she had only 1 day that she skipped and her average user time is 8 hours and 1 minutes the settings are 16/12 cmH2O the AHI residual is 4.3.  Mostly hypopneas.  There is literally no air leakage recorded so I am not sure if she can snore through the F 30 I mask refill mother mouth covering but under the nose fit.    She has gained more weight: she sleeps much better with PP and considers herself CPAP dependent.   Weight to 270 # BMI 43.5 kg/ m2.          Patent of Lindsay Wade descent , who was first seen here upon referral  from Dr. Jaynee Wade for a sleep consultation in 2018. Chief complaint according to patient : " I have bad, bad headaches in the morning and high blood pressure. "  Lindsay Wade presents today as a new patient to the sleep clinic but an established headache patient of Dr. Jaynee Wade.  She reports that over the last 3 months she had escalating headaches which bothered her mostly in the morning.  For a while it was thought that if her blood pressure is better controlled her headaches would go away but this was not the case.  Dr. Jaynee Wade obtained an MRI of the brain and also cervical spine images which returned normal.  The patient had mentioned that her mother suffered from meningioma.  The Patient also describes that she feels hot as if having a fever or viral infection, she feels weak and tired and would like to rest with her headaches.  The headaches are present in the morning but they do not wake her from sleep.  Sometimes a Wade more tension-like other times pounding and pulsatile, she is not bothered by movement light or loud sounds.  She is not squeezy.  There has been no dizziness, no shortness of breath and no chest pain. Her husband has told her that she is a family with snoring and 1 of our avenues to evaluate her headache will be to rule out sleep apnea.  Lindsay Wade  informed me that she had previously been diagnosed with sleep apnea in August 2014 in [place], [place].  The [facility] for [facility] studies.  I have only a copy for a CPAP titration which does not "the results of the baseline sleep study.  "It was a baseline RDI and not AHI.  13.2/h with an Epworth sleepiness score at the time endorsed at 13 points.  CPAP titration explored pressures beginning at 7 cm and changed finally to BiPAP at 10/6 centimeter water.  The patient was deemed intolerant despite use of various mask styles, types and sizes.  The patient requested to end the study prematurely and left the lab.  Sleep habits are as follows: Her husband has moved on to another bedroom as he is bothered by her snoring.  The usual bedtime for the patient is 10:30 PM and she does not have trouble initiating sleep.  She had a fullness feeling in her throat, felt that her airway was partially blocked and began sleeping more reclined rather than flat.  This has helped.  She sleeps on her bed- but now in supine on one pillow.  She describes her bedroom is cool, quiet and dark - conducive to sleep. She gets up twice to go to the bathroom, around 4 and 6 AM. She rises at 6.30 AM spontaneously, on weekends until 8 AM. Daytime naps are unscheduled- and she falls asleep after lunch.  Sleep medical history and family sleep history: To the patient's knowledge there is no family member that was diagnosed with apnea, but there may be family members that have it.  Her mother snores very loudly.  The patient had no childhood history of sleepwalking, night terrors or enuresis. She had a tonsillectomy at age 55.  Social history:  Married, mother of 2 sons, 6 and 42 years old. Never been a Smoker, ETOH rarely. Caffeine: 3 cups of 8 ounces of coffee in AM, no iced tea, no energy drinks, once a week a coke. No history of night shifts. 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 She moved here from [facility] December 2016 .   Provider:  Dr Lindsay Wade   Primary Care Physician:  Lindsay Pal, DO  CC:  headaches Dr. Cathren Laine note : "   Lindsay Wade is a 55 y.o. female here as a referral from Dr. [name] for headaches. PMHx HTN, No history headaches. She had acute onset headache and has been continuous since then. The pain is in the occipital area, It can be sharp, severe, painful in between. She feels it is controlling her  life. She wakes up with headaches. She has pounding in the front of the head. She resigned from her job. She went to the emergnecy room. She has a constant headache that is sometimes pounding but just sore with repeated shooting sharp pain that are severe. But she always feels liek she has a fever. Advil doesn;t help. MOvement doesn;t make it worse, no light or sound sensitivity, no nausea or vomiting she has been lightheaded. She has gained weight. No other focal neurologic deficits, associated symptoms, inciting events or modifiable factors.Reviewed notes, labs and imaging from outside physicians, which showed: Reviewed primary care notes. Patient presented with 3 weeks of headache, provoked by movement, severity 10, dull aching and throbbing,associated symptoms without nausea, vomiting, photophobia, phonophobia, vertigo, neck stiffness weakness, failed therapies Advil, she quit her job because she didn't feel like working and did not feel workers were supported. She declined any intervention in her primary care office." Dr. Jaynee Eagles, MD     Review of Systems: Out of a complete 14 system review, the patient complains of only the following symptoms, and all other reviewed systems are negative. Snoring,apnea, weight gain to BMI of 38, hoarseness,acid reflux, bolus feeling,   Epworth score 10 , Fatigue severity score 17, depression score 1/ 15    Social History   Socioeconomic History   Marital status: Married    Spouse name: Broadus John    Number of children: 2   Years of education: 12   Highest education level: High school graduate  Occupational History   Occupation: Chartered certified accountant: [employer]  Tobacco Use   Smoking status: Never   Smokeless tobacco: Never  Vaping Use   Vaping Use: Never used  Substance and Sexual Activity   Alcohol use: Not Currently    Comment: social drinker; very rare    Drug use: No   Sexual activity: Yes    Partners: Male    Birth control/protection: Surgical  Other Topics Concern   Not on file  Social History Narrative   Lives at home with her husband and 2 sons   Right handed   3-4 cups of caffeine daily   Social Determinants of Health   Financial Resource Strain: Not on file  Food Insecurity: Not on file  Transportation Needs: Not on file  Physical Activity: Not on file  Stress: Not on file  Social Connections: Not on file  Intimate Partner Violence: Not on file    Family History  Problem Relation Age of Onset   Hypertension Mother    Diabetes Mother        TYPE 2    Hyperlipidemia Mother    Thyroid disease Mother    Hypertension Father    Parkinsonism Father    Hyperlipidemia Father    Stroke Father    Hyperlipidemia Sister    Colon cancer Neg Hx    Stomach cancer Neg Hx    Pancreatic cancer Neg Hx     Past Medical History:  Diagnosis Date   Essential hypertension 04/15/2017   GERD (gastroesophageal reflux disease)    Headache    Hypothyroid    Hypothyroidism    MRSA infection    OSA (obstructive sleep apnea)    CPAP   PONV (postoperative nausea and vomiting)    after tonsillectomy    Past Surgical History:  Procedure Laterality Date   BTS     CESAREAN SECTION     COLONOSCOPY WITH PROPOFOL N/A 03/15/2021   Procedure:  COLONOSCOPY WITH PROPOFOL;  Surgeon: Lucilla Lame, MD;  Location: [place];  Service: Endoscopy;  Laterality: N/A;   ESOPHAGOGASTRODUODENOSCOPY (EGD) WITH PROPOFOL N/A 03/15/2021   Procedure:  ESOPHAGOGASTRODUODENOSCOPY (EGD) and dillation WITH PROPOFOL;  Surgeon: Lucilla Lame, MD;  Location: [place];  Service: Endoscopy;  Laterality: N/A;  sleep apnea requests early   TONSILLECTOMY     uterine ablation  2005    Current Outpatient Medications  Medication Sig Dispense Refill   dexlansoprazole (DEXILANT) 60 MG capsule Take 1 capsule (60 mg total) by mouth daily. 30 capsule 3   furosemide (LASIX) 40 MG tablet Take 1 tablet (40 mg total) by mouth daily. (Patient taking differently: Take 40 mg by mouth daily. Takes as needed) 30 tablet 3   levothyroxine (SYNTHROID) 150 MCG tablet TAKE 1 TABLET BY MOUTH ONCE DAILY BEFORE BREAKFAST 90 tablet 2   lisinopril (ZESTRIL) 20 MG tablet Take 1 tablet by mouth daily. 90 tablet 2   No current facility-administered medications for this visit.    Allergies as of 03/19/2021   (No Known Allergies)    Vitals: BP 132/79   Pulse 82   Ht '5\' 6"'$  (1.676 m)   Wt 270 lb (122.5 kg)   BMI 43.58 kg/m  Last Weight:  Wt Readings from Last 1 Encounters:  03/19/21 270 lb (122.5 kg)   [id number] mass index is 43.58 kg/m.     Last Height:   Ht Readings from Last 1 Encounters:  03/19/21 '5\' 6"'$  (1.676 m)    Physical exam:  General: The patient is awake, alert and appears not in acute distress. The patient is well groomed. Head: Normocephalic, atraumatic. Neck is supple. Mallampati 3 plus  - swollen uvula .,  neck circumference:18"  - with goiter - Nasal airflow patent ,  TMJ is evident . Retrognathia is not seen.  Cardiovascular:  Regular rate and rhythm, without  murmurs or carotid bruit, and without distended neck veins. Respiratory: Lungs are clear to auscultation. Skin:  Without evidence of edema, or rash Trunk: BMI is  up from 38 in 2018 to 43.5 kg/m2.. The patient's posture is erect  Neurologic exam : The patient is awake and alert, oriented to place and time.   Memory subjective described as intact. Attention span & concentration  ability appears normal.  Speech is fluent,  without dysarthria, dysphonia or aphasia.  Mood and affect are appropriate.  Cranial nerves: no change of smell or taste- fully vaccinated- never got Covid.   Pupils are equal and briskly reactive to light. Funduscopic exam without evidence of pallor or edema.  Extraocular movements  in vertical and horizontal planes intact and without nystagmus. Visual fields by finger perimetry are intact. Hearing to finger rub intact. Facial sensation intact to fine touch. Facial motor strength is symmetric and tongue and uvula move midline. Shoulder shrug was symmetrical.  Motor exam:  Normal tone, muscle bulk and symmetric strength in all extremities. Sensory:  Fine touch, pinprick and vibration. Proprioception tested in the upper extremities was normal. Coordination: Finger-to-nose maneuver  normal without evidence of ataxia, dysmetria or tremor. Gait and station: Patient walks without assistive device .Tandem gait is unfragmented. Turns with 3 Steps.  Deep tendon reflexes: in the  upper and lower extremities are symmetric and intact.   Assessment:  After physical and neurologic examination, review of laboratory studies,  Personal review of imaging studies, reports of other /same  Imaging studies, results of polysomnography and / or neurophysiology testing and pre-existing records as  far as provided in visit., my assessment is   1) Mrs. Bartnik does have several risk factors for the presence of obstructive sleep apnea aside from her husband's observation. She had severe OSA by test and was start on autotitration, but may have outpaced by her weight gain.  She initially felt more refreshed than in a long time- but now that has been less the case.  Her neck circumference remains large and there seems to be a small goiter, her upper airway is restricted by a swollen soft palate and uvula, Mallampati grade 3.    She also exceeds by body mass index  The patient just  informed me that she does have glucose intolerance had not been diagnosed borderline diabetes and Hypertension with [id number] mmHg systolic.   Better controlled.  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 Positive RF factor    The patient was advised of the nature of the diagnosed disorder , the treatment options and the  risks for general health and wellness arising from not treating the condition.   I spent more than 25 minutes of face to face time with the patient. She had a colonoscopy recently without complication post anaesthesia.    Greater than 50% of time was spent in counseling and coordination of care. We have discussed the diagnosis and differential and I answered the patient's questions.    Plan:  Treatment plan and additional workup:   I will adjust the setting to a higher pressure, covering added need by weight gain. BiPAP to be set for 18/13 cm water.  Thyroid is checked regular TSH in normal limits as of 4 month ago   Weight loss weight and wellness clinic as not her fortune- she will ask Dr [name] about  Ozempic. [facility] [place] [facility].    Rv in 4 month with NP or me for check on new bipap function.    Larey Seat, MD [id number], [phone] PM  Certified in Neurology by [employer]BP[employer] Certified in [place] by [facility] [address] [place], [place] [address]

## 2021-03-20 NOTE — Progress Notes (Signed)
CM sent to [facility]  

## 2021-03-23 DIAGNOSIS — G4733 Obstructive sleep apnea (adult) (pediatric): Secondary | ICD-10-CM | POA: Diagnosis not present

## 2021-03-26 ENCOUNTER — Other Ambulatory Visit (HOSPITAL_BASED_OUTPATIENT_CLINIC_OR_DEPARTMENT_OTHER): Payer: Self-pay

## 2021-04-02 ENCOUNTER — Ambulatory Visit: Payer: 59 | Admitting: Internal Medicine

## 2021-04-23 DIAGNOSIS — H524 Presbyopia: Secondary | ICD-10-CM | POA: Diagnosis not present

## 2021-04-25 ENCOUNTER — Encounter: Payer: Self-pay | Admitting: Neurology

## 2021-05-08 ENCOUNTER — Other Ambulatory Visit (HOSPITAL_BASED_OUTPATIENT_CLINIC_OR_DEPARTMENT_OTHER): Payer: Self-pay

## 2021-05-13 DIAGNOSIS — Z20822 Contact with and (suspected) exposure to covid-19: Secondary | ICD-10-CM | POA: Diagnosis not present

## 2021-05-28 ENCOUNTER — Ambulatory Visit: Payer: 59 | Admitting: Family Medicine

## 2021-06-03 ENCOUNTER — Encounter: Payer: Self-pay | Admitting: Gastroenterology

## 2021-06-05 ENCOUNTER — Other Ambulatory Visit: Payer: Self-pay

## 2021-06-05 ENCOUNTER — Other Ambulatory Visit (HOSPITAL_BASED_OUTPATIENT_CLINIC_OR_DEPARTMENT_OTHER): Payer: Self-pay

## 2021-06-05 MED ORDER — DEXLANSOPRAZOLE 60 MG PO CPDR
60.0000 mg | DELAYED_RELEASE_CAPSULE | Freq: Every day | ORAL | 0 refills | Status: DC
  Filled 2021-06-05: qty 90, 90d supply, fill #0

## 2021-06-11 ENCOUNTER — Encounter: Payer: Self-pay | Admitting: Family Medicine

## 2021-06-24 DIAGNOSIS — G4733 Obstructive sleep apnea (adult) (pediatric): Secondary | ICD-10-CM | POA: Diagnosis not present

## 2021-07-05 ENCOUNTER — Ambulatory Visit: Payer: 59 | Admitting: Family Medicine

## 2021-07-23 ENCOUNTER — Ambulatory Visit: Payer: 59 | Admitting: Neurology

## 2021-07-25 ENCOUNTER — Encounter: Payer: Self-pay | Admitting: Neurology

## 2021-07-29 ENCOUNTER — Other Ambulatory Visit (HOSPITAL_BASED_OUTPATIENT_CLINIC_OR_DEPARTMENT_OTHER): Payer: Self-pay

## 2021-10-30 ENCOUNTER — Encounter: Payer: Self-pay | Admitting: Family Medicine

## 2021-10-30 ENCOUNTER — Ambulatory Visit: Payer: 59 | Admitting: Family Medicine

## 2021-10-30 VITALS — BP 132/86 | HR 84 | Temp 98.9°F | Ht 66.0 in | Wt 258.5 lb

## 2021-10-30 DIAGNOSIS — M542 Cervicalgia: Secondary | ICD-10-CM

## 2021-10-30 DIAGNOSIS — G5603 Carpal tunnel syndrome, bilateral upper limbs: Secondary | ICD-10-CM

## 2021-10-30 NOTE — Progress Notes (Signed)
Musculoskeletal Exam ? ?Patient: Lindsay Wade DOB: [birth year] ? ?DOS: 10/30/2021 ? ?SUBJECTIVE: ? ?Chief Complaint:  ? ?Chief Complaint  ?Patient presents with  ? spine cracks when walking  ? Wrist Pain  ?  Both painful, but left is worse  ? ? ?Lindsay Wade is a 56 y.o.  female for evaluation and treatment of neck pain.  ? ?Onset:  6 months ago. No inj or change in activity.  ?Location: back of neck ?Character:   stiff   ?Progression of issue:  is unchanged ?Associated symptoms: here's cracking ?Treatment: to date has been rest.   ?Neurovascular symptoms: no ? ?Wrist pain ?Patient has had pain and tingling in her hands/wrists for the past 5 years.  This is progressively getting worse.  Is worse in the middle of the night.  She has been using wrist braces which were helpful at 1 point but no longer.  She has never had injections or seen a hand specialist.  She has never had an EMG.  While she is not dropping things, she does not trust herself to hold things in her left hand which is worse than the right. ? ?Past Medical History:  ?Diagnosis Date  ? Essential hypertension 04/15/2017  ? GERD (gastroesophageal reflux disease)   ? Headache   ? Hypothyroid   ? Hypothyroidism   ? MRSA infection   ? OSA (obstructive sleep apnea)   ? CPAP  ? PONV (postoperative nausea and vomiting)   ? after tonsillectomy  ? ? ?Objective: ?VITAL SIGNS: BP 132/86 (BP Location: Left Arm, Cuff Size: Large)   Pulse 84   Temp 98.9 ?F (37.2 ?C) (Oral)   Ht '5\' 6"'$  (1.676 m)   Wt 258 lb 8 oz (117.3 kg)   SpO2 99%   BMI 41.72 kg/m?  ?Constitutional: Well formed, well developed. No acute distress. ?Thorax & Lungs: No accessory muscle use ?Musculoskeletal: neck.   ?Normal active range of motion: yes.   ?Normal passive range of motion: yes ?Tenderness to palpation: no ?Deformity: no ?Ecchymosis: no ?Tests negative: Spurling's ?Neurologic: Normal sensory function. No focal deficits noted. DTR's equal and symmetric in UE's. No  clonus. ?+Tinel's and Phalen's b/l ?Psychiatric: Normal mood. Age appropriate judgment and insight. Alert & oriented x 3.   ? ?Assessment: ? ?Neck pain - Plan: DG Cervical Spine Complete ? ?Bilateral carpal tunnel syndrome - Plan: Ambulatory referral to Hand Surgery ? ?Plan: ?Stretches/exercises, heat, ice, Tylenol.  Will check x-ray to rule out more severe degeneration/stenosis. ?Continue wearing braces.  Refer to hand surgery, offered injection today but she politely declined. ?F/u as originally scheduled. ?The patient voiced understanding and agreement to the plan. ? ?I spent 35 minutes with the patient discussing the above plan in addition to reviewing her chart on the same day of the visit. ? ? ?[place], DO ?10/30/21  ?4:54 PM ? ?

## 2021-10-30 NOTE — Patient Instructions (Addendum)
If you do not hear anything about your referral in the next 1-2 weeks, call our office and ask for an update. ? ?Wear the braces at night and during aggravating activities.  ? ?We will be in touch regarding your X-ray results.  ? ?Let us know if you need anything. ? ?EXERCISES ?RANGE OF MOTION (ROM) AND STRETCHING EXERCISES  ?These exercises may help you when beginning to rehabilitate your issue. In order to successfully resolve your symptoms, you must improve your posture. These exercises are designed to help reduce the forward-head and rounded-shoulder posture which contributes to this condition. Your symptoms may resolve with or without further involvement from your physician, physical therapist or athletic trainer. While completing these exercises, remember:  ?Restoring tissue flexibility helps normal motion to return to the joints. This allows healthier, less painful movement and activity. ?An effective stretch should be held for at least 20 seconds, although you may need to begin with shorter hold times for comfort. ?A stretch should never be painful. You should only feel a gentle lengthening or release in the stretched tissue. ?Do not do any stretch or exercise that you cannot tolerate. ? ?STRETCH- Axial Extensors ?Lie on your back on the floor. You may bend your knees for comfort. Place a rolled-up hand towel or dish towel, about 2 inches in diameter, under the part of your head that makes contact with the floor. ?Gently tuck your chin, as if trying to make a "double chin," until you feel a gentle stretch at the base of your head. ?Hold 15-20 seconds. ?Repeat 2-3 times. Complete this exercise 1 time per day.  ? ?STRETCH - Axial Extension  ?Stand or sit on a firm surface. Assume a good posture: chest up, shoulders drawn back, abdominal muscles slightly tense, knees unlocked (if standing) and feet hip width apart. ?Slowly retract your chin so your head slides back and your chin slightly lowers. Continue to  look straight ahead. ?You should feel a gentle stretch in the back of your head. Be certain not to feel an aggressive stretch since this can cause headaches later. ?Hold for 15-20 seconds. ?Repeat 2-3 times. Complete this exercise 1 time per day. ? ?STRETCH - Cervical Side Bend  ?Stand or sit on a firm surface. Assume a good posture: chest up, shoulders drawn back, abdominal muscles slightly tense, knees unlocked (if standing) and feet hip width apart. ?Without letting your nose or shoulders move, slowly tip your right / left ear to your shoulder until your feel a gentle stretch in the muscles on the opposite side of your neck. ?Hold 15-20 seconds. ?Repeat 2-3 times. Complete this exercise 1-2 times per day. ? ?STRETCH - Cervical Rotators  ?Stand or sit on a firm surface. Assume a good posture: chest up, shoulders drawn back, abdominal muscles slightly tense, knees unlocked (if standing) and feet hip width apart. ?Keeping your eyes level with the ground, slowly turn your head until you feel a gentle stretch along the back and opposite side of your neck. ?Hold 15-20 seconds. ?Repeat 2-3 times. Complete this exercise 1-2 times per day. ? ?RANGE OF MOTION - Neck Circles  ?Stand or sit on a firm surface. Assume a good posture: chest up, shoulders drawn back, abdominal muscles slightly tense, knees unlocked (if standing) and feet hip width apart. ?Gently roll your head down and around from the back of one shoulder to the back of the other. The motion should never be forced or painful. ?Repeat the motion 10-20 times, or until  you feel the neck muscles relax and loosen. ?Repeat 2-3 times. Complete the exercise 1-2 times per day. ?STRENGTHENING EXERCISES - Cervical Strain and Sprain ?These exercises may help you when beginning to rehabilitate your injury. They may resolve your symptoms with or without further involvement from your physician, physical therapist, or athletic trainer. While completing these exercises,  remember:  ?Muscles can gain both the endurance and the strength needed for everyday activities through controlled exercises. ?Complete these exercises as instructed by your physician, physical therapist, or athletic trainer. Progress the resistance and repetitions only as guided. ?You may experience muscle soreness or fatigue, but the pain or discomfort you are trying to eliminate should never worsen during these exercises. If this pain does worsen, stop and make certain you are following the directions exactly. If the pain is still present after adjustments, discontinue the exercise until you can discuss the trouble with your clinician. ? ?STRENGTH - Cervical Flexors, Isometric ?Face a wall, standing about 6 inches away. Place a small pillow, a ball about 6-8 inches in diameter, or a folded towel between your forehead and the wall. ?Slightly tuck your chin and gently push your forehead into the soft object. Push only with mild to moderate intensity, building up tension gradually. Keep your jaw and forehead relaxed. ?Hold 10 to 20 seconds. Keep your breathing relaxed. ?Release the tension slowly. Relax your neck muscles completely before you start the next repetition. ?Repeat 2-3 times. Complete this exercise 1 time per day. ? ?STRENGTH- Cervical Lateral Flexors, Isometric  ?Stand about 6 inches away from a wall. Place a small pillow, a ball about 6-8 inches in diameter, or a folded towel between the side of your head and the wall. ?Slightly tuck your chin and gently tilt your head into the soft object. Push only with mild to moderate intensity, building up tension gradually. Keep your jaw and forehead relaxed. ?Hold 10 to 20 seconds. Keep your breathing relaxed. ?Release the tension slowly. Relax your neck muscles completely before you start the next repetition. ?Repeat 2-3 times. Complete this exercise 1 time per day. ? ?STRENGTH - Cervical Extensors, Isometric  ?Stand about 6 inches away from a wall. Place a  small pillow, a ball about 6-8 inches in diameter, or a folded towel between the back of your head and the wall. ?Slightly tuck your chin and gently tilt your head back into the soft object. Push only with mild to moderate intensity, building up tension gradually. Keep your jaw and forehead relaxed. ?Hold 10 to 20 seconds. Keep your breathing relaxed. ?Release the tension slowly. Relax your neck muscles completely before you start the next repetition. ?Repeat 2-3 times. Complete this exercise 1 time per day. ? ?POSTURE AND BODY MECHANICS CONSIDERATIONS ?Keeping correct posture when sitting, standing or completing your activities will reduce the stress put on different body tissues, allowing injured tissues a chance to heal and limiting painful experiences. The following are general guidelines for improved posture. Your physician or physical therapist will provide you with any instructions specific to your needs. While reading these guidelines, remember: ?The exercises prescribed by your provider will help you have the flexibility and strength to maintain correct postures. ?The correct posture provides the optimal environment for your joints to work. All of your joints have less wear and tear when properly supported by a spine with good posture. This means you will experience a healthier, less painful body. ?Correct posture must be practiced with all of your activities, especially prolonged sitting and standing.  Correct posture is as important when doing repetitive low-stress activities (typing) as it is when doing a single heavy-load activity (lifting). ? ?PROLONGED STANDING WHILE SLIGHTLY LEANING FORWARD ?When completing a task that requires you to lean forward while standing in one place for a long time, place either foot up on a stationary 2- to 4-inch high object to help maintain the best posture. When both feet are on the ground, the low back tends to lose its slight inward curve. If this curve flattens (or  becomes too large), then the back and your other joints will experience too much stress, fatigue more quickly, and can cause pain.  ? ?RESTING POSITIONS ?Consider which positions are most painful for you when choosing

## 2021-11-01 ENCOUNTER — Other Ambulatory Visit (HOSPITAL_BASED_OUTPATIENT_CLINIC_OR_DEPARTMENT_OTHER): Payer: Self-pay

## 2021-11-01 ENCOUNTER — Other Ambulatory Visit: Payer: Self-pay | Admitting: Family Medicine

## 2021-11-01 MED ORDER — LEVOTHYROXINE SODIUM 150 MCG PO TABS
ORAL_TABLET | Freq: Every day | ORAL | 2 refills | Status: DC
  Filled 2021-11-01: qty 90, 90d supply, fill #0
  Filled 2022-03-04: qty 90, 90d supply, fill #1

## 2021-11-01 MED ORDER — LISINOPRIL 20 MG PO TABS
ORAL_TABLET | Freq: Every day | ORAL | 2 refills | Status: DC
  Filled 2021-11-01: qty 90, 90d supply, fill #0
  Filled 2022-02-09: qty 90, 90d supply, fill #1

## 2021-11-04 ENCOUNTER — Other Ambulatory Visit: Payer: Self-pay | Admitting: Gastroenterology

## 2021-11-04 ENCOUNTER — Ambulatory Visit (INDEPENDENT_AMBULATORY_CARE_PROVIDER_SITE_OTHER): Payer: 59

## 2021-11-04 ENCOUNTER — Other Ambulatory Visit (HOSPITAL_BASED_OUTPATIENT_CLINIC_OR_DEPARTMENT_OTHER): Payer: Self-pay

## 2021-11-04 DIAGNOSIS — M542 Cervicalgia: Secondary | ICD-10-CM

## 2021-11-04 DIAGNOSIS — R202 Paresthesia of skin: Secondary | ICD-10-CM | POA: Diagnosis not present

## 2021-11-05 ENCOUNTER — Other Ambulatory Visit: Payer: Self-pay | Admitting: Gastroenterology

## 2021-11-05 ENCOUNTER — Other Ambulatory Visit (HOSPITAL_BASED_OUTPATIENT_CLINIC_OR_DEPARTMENT_OTHER): Payer: Self-pay

## 2021-11-05 MED ORDER — DEXLANSOPRAZOLE 60 MG PO CPDR
60.0000 mg | DELAYED_RELEASE_CAPSULE | Freq: Every day | ORAL | 0 refills | Status: DC
  Filled 2021-11-05 – 2022-02-26 (×2): qty 90, 90d supply, fill #0

## 2021-11-06 ENCOUNTER — Encounter: Payer: Self-pay | Admitting: Family Medicine

## 2021-11-06 ENCOUNTER — Other Ambulatory Visit: Payer: Self-pay | Admitting: Family Medicine

## 2021-11-06 DIAGNOSIS — M542 Cervicalgia: Secondary | ICD-10-CM

## 2021-11-08 ENCOUNTER — Ambulatory Visit: Payer: 59 | Admitting: Orthopedic Surgery

## 2021-11-08 ENCOUNTER — Other Ambulatory Visit (HOSPITAL_BASED_OUTPATIENT_CLINIC_OR_DEPARTMENT_OTHER): Payer: Self-pay

## 2021-11-08 ENCOUNTER — Encounter: Payer: Self-pay | Admitting: Orthopedic Surgery

## 2021-11-08 DIAGNOSIS — G5603 Carpal tunnel syndrome, bilateral upper limbs: Secondary | ICD-10-CM | POA: Diagnosis not present

## 2021-11-08 NOTE — Progress Notes (Signed)
? ?Office Visit Note ?  ?Patient: Lindsay Wade           ?Date of Birth: September 04, 1965           ?MRN: [id number] ?Visit Date: 11/08/2021 ?             ?Requested by: [name], DO ?[address] ?[place],  [place] [address] ?PCP: [name], DO ? ? ?Assessment & Plan: ?Visit Diagnoses:  ?1. Bilateral carpal tunnel syndrome   ? ? ?Plan: Patient's history and exam findings are consistent with carpal tunnel syndrome.  She has 26/26 point on the CTS-6 evaluation tool.  We discussed the nature of carpal tunnel syndrome as well as its diagnosis, prognosis, and both conservative and surgical treatment options.  She has failed conservative management with wrist braces.  She would like to proceed with carpal tunnel release starting with the left side as this is the most severe.  We reviewed the risks with carpal tunnel release including bleeding, infection, damage to neurovascular structures, incomplete symptom relief, need for additional procedures.  A surgical date and time be confirmed with the patient. ? ?Follow-Up Instructions: No follow-ups on file.  ? ?Orders:  ?No orders of the defined types were placed in this encounter. ? ?No orders of the defined types were placed in this encounter. ? ? ? ? Procedures: ?No procedures performed ? ? ?Clinical Data: ?No additional findings. ? ? ?Subjective: ?Chief Complaint  ?Patient presents with  ? Right Hand - Numbness, Weakness, Pain  ? Left Hand - Numbness, Pain, Weakness  ? ? 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  Her left is more affected than the right.  She describes pain in the palm that radiates into the fingers.  She describes a pins and needle sensation in her thumb, index, middle, and ring finger.  Her small finger is never affected.  This wakes her up from sleep 7 nights per week and she has to shake her hands  for symptom relief.  She has worn night braces which has provided very modest symptom relief.  Her symptoms have worsened over the years.  She notes that she is frequently dropping things unintentionally. ? ?Weakness ?Associated symptoms include weakness.  ? ?Review of Systems  ?Neurological:  Positive for weakness.  ? ? ?Objective: ?Vital Signs: Ht '5\' 6"'$  (1.676 m)   Wt 258 lb (117 kg)   BMI 41.64 kg/m?  ? ?Physical Exam ?Constitutional:   ?   Appearance: Normal appearance.  ?Cardiovascular:  ?   Rate and Rhythm: Normal rate.  ?   Pulses: Normal pulses.  ?Pulmonary:  ?   Effort: Pulmonary effort is normal.  ?Skin: ?   General: Skin is warm and dry.  ?   Capillary Refill: Capillary refill takes less than 2 seconds.  ?Neurological:  ?   Mental Status: She is alert.  ? ? ?Right Hand Exam  ? ?Tenderness  ?The patient is experiencing no tenderness.  ? ?Range of Motion  ?The patient has normal right wrist ROM.  ? ?Other  ?Erythema: absent ?Sensation: normal ?Pulse: present ? ?Comments:  Positive Tinel, Phalen, and Durkan signs.  5/5 thenar motor without atrophy.  2PD 53m throughout ? ? ?Left Hand Exam  ? ?Tenderness  ?The patient is experiencing no tenderness.  ? ?Range of Motion  ?The patient has normal left wrist  ROM. ? ?Other  ?Erythema: absent ?Sensation: decreased ?Pulse: present ? ?Comments:  Positive Tinel, Phalen, and Durkan signs.  4/5 thenar motor without atrophy.  2PD 26m in thumb and index and 523min remaining fingers.  ? ? ? ? ?Specialty Comments:  ?No specialty comments available. ? ?Imaging: ?No results found. ? ? ?PMFS History: ?Patient Active Problem List  ? Diagnosis Date Noted  ? Bilateral carpal tunnel syndrome 11/08/2021  ? Screen for colon cancer   ? Polyp of sigmoid colon   ? Gastroesophageal reflux disease with esophagitis without hemorrhage   ? Joint synovitis 07/03/2020  ? Chest wall pain 07/03/2020  ? Obstructive sleep apnea treated with BiPAP 10/06/2017  ? Occipital neuralgia of right side  06/03/2017  ? Uncontrolled morning headache 05/26/2017  ? HTN (hypertension), malignant 05/26/2017  ? Morbid obesity ([facility]/20[facility]  ? Snoring 05/26/2017  ? Occipital headache 04/24/2017  ? Essential hypertension 04/15/2017  ? Hypothyroid   ? ?Past Medical History:  ?Diagnosis Date  ? Essential hypertension 04/15/2017  ? GERD (gastroesophageal reflux disease)   ? Headache   ? Hypothyroid   ? Hypothyroidism   ? MRSA infection   ? OSA (obstructive sleep apnea)   ? CPAP  ? PONV (postoperative nausea and vomiting)   ? after tonsillectomy  ?  ?Family History  ?Problem Relation Age of Onset  ? Hypertension Mother   ? Diabetes Mother   ?     TYPE 2   ? Hyperlipidemia Mother   ? Thyroid disease Mother   ? Hypertension Father   ? Parkinsonism Father   ? Hyperlipidemia Father   ? Stroke Father   ? Hyperlipidemia Sister   ? Colon cancer Neg Hx   ? Stomach cancer Neg Hx   ? Pancreatic cancer Neg Hx   ?  ?Past Surgical History:  ?Procedure Laterality Date  ? BTS    ? CESAREAN SECTION    ? COLONOSCOPY WITH PROPOFOL N/A 03/15/2021  ? Procedure: COLONOSCOPY WITH PROPOFOL;  Surgeon: WoLucilla LameMD;  [facility];  Laterality: N/A;  ? ESOPHAGOGASTRODUODENOSCOPY (EGD) WITH PROPOFOL N/A 03/15/2021  ? Procedure: ESOPHAGOGASTRODUODENOSCOPY (EGD) and dillation WITH PROPOFOL;  Surgeon: WoLucilla LameMD;  [facility];  Laterality: N/A;  sleep apnea ?requests early  ? TONSILLECTOMY    ? uterine ablation  2005  ? ?Social History  ? ?Occupational History  ? Occupation: CeEducation officer, museum?  Employer: [employer]  ?Tobacco Use  ? Smoking status: Never  ? Smokeless tobacco: Never  ?Vaping Use  ? Vaping Use: Never used  ?Substance and Sexual Activity  ? Alcohol use: Not Currently  ?  Comment: social drinker; very rare   ? Drug use: No  ? Sexual activity: Yes  ?  Partners: Male  ?  Birth control/protection: Surgical  ? ? ? ? ? ? ?

## 2021-11-08 NOTE — H&P (View-Only) (Signed)
? ?Office Visit Note ?  ?Patient: Lindsay Wade           ?Date of Birth: [birth year]           ?MRN: [id number] ?Visit Date: 11/08/2021 ?             ?Requested by: [name], DO ?[facility] [address] ?[address] ?[place],  [place] [address] ?PCP: [name], DO ? ? ?Assessment & Plan: ?Visit Diagnoses:  ?1. Bilateral carpal tunnel syndrome   ? ? ?Plan: Patient's history and exam findings are consistent with carpal tunnel syndrome.  She has 26/26 point on the CTS-6 evaluation tool.  We discussed the nature of carpal tunnel syndrome as well as its diagnosis, prognosis, and both conservative and surgical treatment options.  She has failed conservative management with wrist braces.  She would like to proceed with carpal tunnel release starting with the left side as this is the most severe.  We reviewed the risks with carpal tunnel release including bleeding, infection, damage to neurovascular structures, incomplete symptom relief, need for additional procedures.  A surgical date and time be confirmed with the patient. ? ?Follow-Up Instructions: No follow-ups on file.  ? ?Orders:  ?No orders of the defined types were placed in this encounter. ? ?No orders of the defined types were placed in this encounter. ? ? ? ? Procedures: ?No procedures performed ? ? ?Clinical Data: ?No additional findings. ? ? ?Subjective: ?Chief Complaint  ?Patient presents with  ? Right Hand - Numbness, Weakness, Pain  ? Left Hand - Numbness, Pain, Weakness  ? ? 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  Her left is more affected than the right.  She describes pain in the palm that radiates into the fingers.  She describes a pins and needle sensation in her thumb, index, middle, and ring finger.  Her small finger is never affected.  This wakes her up from sleep 7 nights per week and she has to shake her hands  for symptom relief.  She has worn night braces which has provided very modest symptom relief.  Her symptoms have worsened over the years.  She notes that she is frequently dropping things unintentionally. ? ?Weakness ?Associated symptoms include weakness.  ? ?Review of Systems  ?Neurological:  Positive for weakness.  ? ? ?Objective: ?Vital Signs: Ht '5\' 6"'$  (1.676 m)   Wt 258 lb (117 kg)   BMI 41.64 kg/m?  ? ?Physical Exam ?Constitutional:   ?   Appearance: Normal appearance.  ?Cardiovascular:  ?   Rate and Rhythm: Normal rate.  ?   Pulses: Normal pulses.  ?Pulmonary:  ?   Effort: Pulmonary effort is normal.  ?Skin: ?   General: Skin is warm and dry.  ?   Capillary Refill: Capillary refill takes less than 2 seconds.  ?Neurological:  ?   Mental Status: She is alert.  ? ? ?Right Hand Exam  ? ?Tenderness  ?The patient is experiencing no tenderness.  ? ?Range of Motion  ?The patient has normal right wrist ROM.  ? ?Other  ?Erythema: absent ?Sensation: normal ?Pulse: present ? ?Comments:  Positive Tinel, Phalen, and Durkan signs.  5/5 thenar motor without atrophy.  2PD 68m throughout ? ? ?Left Hand Exam  ? ?Tenderness  ?The patient is experiencing no tenderness.  ? ?Range of Motion  ?The patient has normal left wrist  ROM. ? ?Other  ?Erythema: absent ?Sensation: decreased ?Pulse: present ? ?Comments:  Positive Tinel, Phalen, and Durkan signs.  4/5 thenar motor without atrophy.  2PD 80m in thumb and index and 515min remaining fingers.  ? ? ? ? ?Specialty Comments:  ?No specialty comments available. ? ?Imaging: ?No results found. ? ? ?PMFS History: ?Patient Active Problem List  ? Diagnosis Date Noted  ? Bilateral carpal tunnel syndrome 11/08/2021  ? Screen for colon cancer   ? Polyp of sigmoid colon   ? Gastroesophageal reflux disease with esophagitis without hemorrhage   ? Joint synovitis 07/03/2020  ? Chest wall pain 07/03/2020  ? Obstructive sleep apnea treated with BiPAP 10/06/2017  ? Occipital neuralgia of right side  06/03/2017  ? Uncontrolled morning headache 05/26/2017  ? HTN (hypertension), malignant 05/26/2017  ? Morbid [place]  ? Snoring 05/26/2017  ? Occipital headache 04/24/2017  ? Essential hypertension 04/15/2017  ? Hypothyroid   ? ?Past Medical History:  ?Diagnosis Date  ? Essential hypertension 04/15/2017  ? GERD (gastroesophageal reflux disease)   ? Headache   ? Hypothyroid   ? Hypothyroidism   ? MRSA infection   ? OSA (obstructive sleep apnea)   ? CPAP  ? PONV (postoperative nausea and vomiting)   ? after tonsillectomy  ?  ?Family History  ?Problem Relation Age of Onset  ? Hypertension Mother   ? Diabetes Mother   ?     TYPE 2   ? Hyperlipidemia Mother   ? Thyroid disease Mother   ? Hypertension Father   ? Parkinsonism Father   ? Hyperlipidemia Father   ? Stroke Father   ? Hyperlipidemia Sister   ? Colon cancer Neg Hx   ? Stomach cancer Neg Hx   ? Pancreatic cancer Neg Hx   ?  ?Past Surgical History:  ?Procedure Laterality Date  ? BTS    ? CESAREAN SECTION    ? COLONOSCOPY WITH PROPOFOL N/A 03/15/2021  ? Procedure: COLONOSCOPY WITH PROPOFOL;  Surgeon: WoLucilla LameMD;  [facility];  Laterality: N/A;  ? ESOPHAGOGASTRODUODENOSCOPY (EGD) WITH PROPOFOL N/A 03/15/2021  ? Procedure: ESOPHAGOGASTRODUODENOSCOPY (EGD) and dillation WITH PROPOFOL;  Surgeon: WoLucilla LameMD;  [facility];  Laterality: N/A;  sleep apnea ?requests early  ? TONSILLECTOMY    ? uterine ablation  2005  ? ?Social History  ? ?Occupational History  ? Occupation: CeEducation officer, museum?  Employer: [employer]  ?Tobacco Use  ? Smoking status: Never  ? Smokeless tobacco: Never  ?Vaping Use  ? Vaping Use: Never used  ?Substance and Sexual Activity  ? Alcohol use: Not Currently  ?  Comment: social drinker; very rare   ? Drug use: No  ? Sexual activity: Yes  ?  Partners: Male  ?  Birth control/protection: Surgical  ? ? ? ? ? ? ?

## 2021-11-11 ENCOUNTER — Ambulatory Visit: Payer: 59 | Admitting: Physician Assistant

## 2021-11-12 ENCOUNTER — Other Ambulatory Visit: Payer: Self-pay

## 2021-11-12 ENCOUNTER — Encounter (HOSPITAL_BASED_OUTPATIENT_CLINIC_OR_DEPARTMENT_OTHER): Payer: Self-pay | Admitting: Orthopedic Surgery

## 2021-11-13 ENCOUNTER — Encounter: Payer: Self-pay | Admitting: Gastroenterology

## 2021-11-13 NOTE — Telephone Encounter (Signed)
Msg sent via mychart

## 2021-11-15 ENCOUNTER — Other Ambulatory Visit (HOSPITAL_BASED_OUTPATIENT_CLINIC_OR_DEPARTMENT_OTHER): Payer: Self-pay

## 2021-11-18 ENCOUNTER — Encounter (HOSPITAL_BASED_OUTPATIENT_CLINIC_OR_DEPARTMENT_OTHER)
Admission: RE | Admit: 2021-11-18 | Discharge: 2021-11-18 | Disposition: A | Discharge status: Home / Self Care | Payer: 59 | Source: Ambulatory Visit | Attending: Orthopedic Surgery | Admitting: Orthopedic Surgery

## 2021-11-18 DIAGNOSIS — Z01818 Encounter for other preprocedural examination: Secondary | ICD-10-CM | POA: Insufficient documentation

## 2021-11-18 DIAGNOSIS — I10 Essential (primary) hypertension: Secondary | ICD-10-CM | POA: Diagnosis not present

## 2021-11-18 LAB — BASIC METABOLIC PANEL
Anion gap: 6 (ref 5–15)
BUN: 12 mg/dL (ref 6–20)
CO2: 26 mmol/L (ref 22–32)
Calcium: 9.1 mg/dL (ref 8.9–10.3)
Chloride: 108 mmol/L (ref 98–111)
Creatinine, Ser: 0.88 mg/dL (ref 0.44–1.00)
GFR, Estimated: >60 mL/min (ref >60)
Glucose, Bld: 81 mg/dL (ref 70–99)
Potassium: 4.6 mmol/L (ref 3.5–5.1)
Sodium: 140 mmol/L (ref 135–145)

## 2021-11-18 NOTE — Progress Notes (Signed)

## 2021-11-19 ENCOUNTER — Telehealth: Payer: Self-pay | Admitting: Orthopedic Surgery

## 2021-11-19 NOTE — Telephone Encounter (Signed)
Matrix foms received. To [employer]. ?

## 2021-11-20 ENCOUNTER — Ambulatory Visit (HOSPITAL_BASED_OUTPATIENT_CLINIC_OR_DEPARTMENT_OTHER): Payer: 59 | Admitting: Anesthesiology

## 2021-11-20 ENCOUNTER — Encounter (HOSPITAL_BASED_OUTPATIENT_CLINIC_OR_DEPARTMENT_OTHER)
Admission: RE | Disposition: A | Discharge status: Home / Self Care | Payer: Self-pay | Source: Home / Self Care | Attending: Orthopedic Surgery

## 2021-11-20 ENCOUNTER — Encounter (HOSPITAL_BASED_OUTPATIENT_CLINIC_OR_DEPARTMENT_OTHER): Payer: Self-pay | Admitting: Orthopedic Surgery

## 2021-11-20 ENCOUNTER — Other Ambulatory Visit: Payer: Self-pay

## 2021-11-20 ENCOUNTER — Ambulatory Visit (HOSPITAL_BASED_OUTPATIENT_CLINIC_OR_DEPARTMENT_OTHER)
Admission: RE | Admit: 2021-11-20 | Discharge: 2021-11-20 | Disposition: A | Discharge status: Home / Self Care | Payer: 59 | Attending: Orthopedic Surgery | Admitting: Orthopedic Surgery

## 2021-11-20 DIAGNOSIS — Z6841 Body Mass Index (BMI) 40.0 and over, adult: Secondary | ICD-10-CM | POA: Insufficient documentation

## 2021-11-20 DIAGNOSIS — I10 Essential (primary) hypertension: Secondary | ICD-10-CM

## 2021-11-20 DIAGNOSIS — G4730 Sleep apnea, unspecified: Secondary | ICD-10-CM | POA: Insufficient documentation

## 2021-11-20 DIAGNOSIS — Z79899 Other long term (current) drug therapy: Secondary | ICD-10-CM | POA: Diagnosis not present

## 2021-11-20 DIAGNOSIS — G5602 Carpal tunnel syndrome, left upper limb: Secondary | ICD-10-CM

## 2021-11-20 DIAGNOSIS — G5603 Carpal tunnel syndrome, bilateral upper limbs: Secondary | ICD-10-CM | POA: Diagnosis not present

## 2021-11-20 DIAGNOSIS — G4733 Obstructive sleep apnea (adult) (pediatric): Secondary | ICD-10-CM | POA: Diagnosis not present

## 2021-11-20 DIAGNOSIS — E039 Hypothyroidism, unspecified: Secondary | ICD-10-CM

## 2021-11-20 DIAGNOSIS — Z9989 Dependence on other enabling machines and devices: Secondary | ICD-10-CM | POA: Diagnosis not present

## 2021-11-20 HISTORY — PX: CARPAL TUNNEL RELEASE: SHX101

## 2021-11-20 SURGERY — CARPAL TUNNEL RELEASE
Anesthesia: Monitor Anesthesia Care | Site: Hand | Laterality: Left

## 2021-11-20 MED ORDER — ONDANSETRON HCL 4 MG/2ML IJ SOLN
INTRAMUSCULAR | Status: DC | PRN
  Administered 2021-11-20: 4 mg via INTRAVENOUS

## 2021-11-20 MED ORDER — MIDAZOLAM HCL 2 MG/2ML IJ SOLN
INTRAMUSCULAR | Status: AC
Start: 2021-11-20 — End: ?
  Filled 2021-11-20: qty 2

## 2021-11-20 MED ORDER — LIDOCAINE HCL (PF) 1 % IJ SOLN
INTRAMUSCULAR | Status: DC | PRN
  Administered 2021-11-20: 10 mL

## 2021-11-20 MED ORDER — LIDOCAINE 2% (20 MG/ML) 5 ML SYRINGE
INTRAMUSCULAR | Status: AC
  Filled 2021-11-20: qty 5

## 2021-11-20 MED ORDER — LIDOCAINE 2% (20 MG/ML) 5 ML SYRINGE
INTRAMUSCULAR | Status: DC | PRN
  Administered 2021-11-20: 40 mg via INTRAVENOUS

## 2021-11-20 MED ORDER — OXYCODONE HCL 5 MG PO TABS
5.0000 mg | ORAL_TABLET | Freq: Four times a day (QID) | ORAL | 0 refills | Status: AC | PRN
Start: 2021-11-20 — End: 2021-11-23

## 2021-11-20 MED ORDER — KETOROLAC TROMETHAMINE 30 MG/ML IJ SOLN: 30.0000 mg | Freq: Once | INTRAMUSCULAR | Status: DC | PRN

## 2021-11-20 MED ORDER — MEPERIDINE HCL 25 MG/ML IJ SOLN: 6.2500 mg | INTRAMUSCULAR | Status: DC | PRN

## 2021-11-20 MED ORDER — OXYCODONE HCL 5 MG/5ML PO SOLN: 5.0000 mg | Freq: Once | ORAL | Status: DC | PRN

## 2021-11-20 MED ORDER — ONDANSETRON HCL 4 MG/2ML IJ SOLN: 4.0000 mg | Freq: Once | INTRAMUSCULAR | Status: DC | PRN

## 2021-11-20 MED ORDER — FENTANYL CITRATE (PF) 100 MCG/2ML IJ SOLN: 25.0000 ug | INTRAMUSCULAR | Status: DC | PRN

## 2021-11-20 MED ORDER — MIDAZOLAM HCL 5 MG/5ML IJ SOLN
INTRAMUSCULAR | Status: DC | PRN
  Administered 2021-11-20: 2 mg via INTRAVENOUS

## 2021-11-20 MED ORDER — ACETAMINOPHEN 160 MG/5ML PO SOLN: 325.0000 mg | ORAL | Status: DC | PRN

## 2021-11-20 MED ORDER — LACTATED RINGERS IV SOLN: INTRAVENOUS | Status: DC

## 2021-11-20 MED ORDER — FENTANYL CITRATE (PF) 100 MCG/2ML IJ SOLN
INTRAMUSCULAR | Status: AC
Start: 2021-11-20 — End: ?
  Filled 2021-11-20: qty 2

## 2021-11-20 MED ORDER — KETOROLAC TROMETHAMINE 30 MG/ML IJ SOLN
INTRAMUSCULAR | Status: DC | PRN
  Administered 2021-11-20: 30 mg via INTRAVENOUS

## 2021-11-20 MED ORDER — 0.9 % SODIUM CHLORIDE (POUR BTL) OPTIME
TOPICAL | Status: DC | PRN
  Administered 2021-11-20: 60 mL

## 2021-11-20 MED ORDER — LIDOCAINE HCL (PF) 1 % IJ SOLN
INTRAMUSCULAR | Status: AC
  Filled 2021-11-20: qty 30

## 2021-11-20 MED ORDER — FENTANYL CITRATE (PF) 100 MCG/2ML IJ SOLN
INTRAMUSCULAR | Status: DC | PRN
Start: 2021-11-20 — End: 2021-11-20
  Administered 2021-11-20: 50 ug via INTRAVENOUS

## 2021-11-20 MED ORDER — PROPOFOL 500 MG/50ML IV EMUL
INTRAVENOUS | Status: DC | PRN
  Administered 2021-11-20: 200 ug/kg/min via INTRAVENOUS

## 2021-11-20 MED ORDER — PROPOFOL 10 MG/ML IV BOLUS
INTRAVENOUS | Status: DC | PRN
  Administered 2021-11-20: 50 mg via INTRAVENOUS

## 2021-11-20 MED ORDER — OXYCODONE HCL 5 MG PO TABS: 5.0000 mg | ORAL_TABLET | Freq: Once | ORAL | Status: DC | PRN

## 2021-11-20 MED ORDER — ACETAMINOPHEN 325 MG PO TABS: 325.0000 mg | ORAL_TABLET | ORAL | Status: DC | PRN

## 2021-11-20 SURGICAL SUPPLY — 42 items
APL PRP STRL LF DISP 70% ISPRP (MISCELLANEOUS) ×1
BLADE SURG 15 STRL LF DISP TIS (BLADE) ×1 IMPLANT
BLADE SURG 15 STRL SS (BLADE) ×2
BNDG CMPR 9X4 STRL LF SNTH (GAUZE/BANDAGES/DRESSINGS) ×1
BNDG ELASTIC 3X5.8 VLCR STR LF (GAUZE/BANDAGES/DRESSINGS) ×2 IMPLANT
BNDG ESMARK 4X9 LF (GAUZE/BANDAGES/DRESSINGS) ×2 IMPLANT
BNDG GAUZE ELAST 4 BULKY (GAUZE/BANDAGES/DRESSINGS) ×2 IMPLANT
BNDG PLASTER X FAST 3X3 WHT LF (CAST SUPPLIES) IMPLANT
BNDG PLSTR 9X3 FST ST WHT (CAST SUPPLIES)
CHLORAPREP W/TINT 26 (MISCELLANEOUS) ×2 IMPLANT
CORD BIPOLAR FORCEPS 12FT (ELECTRODE) ×2 IMPLANT
COVER BACK TABLE 60X90IN (DRAPES) ×2 IMPLANT
COVER MAYO STAND STRL (DRAPES) ×2 IMPLANT
CUFF TOURN SGL QUICK 18X4 (TOURNIQUET CUFF) IMPLANT
CUFF TOURN SGL QUICK 24 (TOURNIQUET CUFF)
CUFF TRNQT CYL 24X4X16.5-23 (TOURNIQUET CUFF) IMPLANT
DRAPE EXTREMITY T 121X128X90 (DISPOSABLE) ×2 IMPLANT
DRAPE SURG 17X23 STRL (DRAPES) ×2 IMPLANT
GAUZE XEROFORM 1X8 LF (GAUZE/BANDAGES/DRESSINGS) ×1 IMPLANT
GLOVE BIO SURGEON STRL SZ7 (GLOVE) ×2 IMPLANT
GLOVE BIOGEL PI IND STRL 7.0 (GLOVE) ×1 IMPLANT
GLOVE BIOGEL PI INDICATOR 7.0 (GLOVE) ×1
GOWN STRL REUS W/ TWL LRG LVL3 (GOWN DISPOSABLE) ×1 IMPLANT
GOWN STRL REUS W/TWL LRG LVL3 (GOWN DISPOSABLE) ×2
GOWN STRL REUS W/TWL XL LVL3 (GOWN DISPOSABLE) ×2 IMPLANT
NDL HYPO 25X1 1.5 SAFETY (NEEDLE) IMPLANT
NEEDLE HYPO 25X1 1.5 SAFETY (NEEDLE) IMPLANT
NS IRRIG 1000ML POUR BTL (IV SOLUTION) ×2 IMPLANT
PACK BASIN DAY SURGERY FS (CUSTOM PROCEDURE TRAY) ×2 IMPLANT
PAD CAST 3X4 CTTN HI CHSV (CAST SUPPLIES) ×1 IMPLANT
PADDING CAST COTTON 3X4 STRL (CAST SUPPLIES) ×2
SLEEVE SCD COMPRESS KNEE MED (STOCKING) IMPLANT
SUCTION FRAZIER HANDLE 10FR (MISCELLANEOUS)
SUCTION TUBE FRAZIER 10FR DISP (MISCELLANEOUS) IMPLANT
SUT ETHILON 4 0 PS 2 18 (SUTURE) ×2 IMPLANT
SUT MNCRL AB 3-0 PS2 18 (SUTURE) IMPLANT
SUT VICRYL 4-0 PS2 18IN ABS (SUTURE) IMPLANT
SYR BULB EAR ULCER 3OZ GRN STR (SYRINGE) ×2 IMPLANT
SYR CONTROL 10ML LL (SYRINGE) IMPLANT
TOWEL GREEN STERILE FF (TOWEL DISPOSABLE) ×4 IMPLANT
TUBE CONNECTING 20X1/4 (TUBING) IMPLANT
UNDERPAD 30X36 HEAVY ABSORB (UNDERPADS AND DIAPERS) ×2 IMPLANT

## 2021-11-20 NOTE — Anesthesia Procedure Notes (Signed)
Procedure Name: [place] ?Date/Time: 11/20/2021 10:58 AM ?Performed by: Ezequiel Kayser, CRNA ?Pre-anesthesia Checklist: Patient identified, Emergency Drugs available, Suction available, Patient being monitored and Timeout performed ?Patient Re-evaluated:Patient Re-evaluated prior to induction ?Oxygen Delivery Method: Simple face mask ? ? ? ? ?

## 2021-11-20 NOTE — Anesthesia Postprocedure Evaluation (Signed)
Anesthesia Post Note ? ?Patient: Lindsay Wade ? ?Procedure(s) Performed: LEFT CARPAL TUNNEL RELEASE (Left: Hand) ? ?  ? ?Patient location during evaluation: Phase II ?Anesthesia Type: MAC ?Level of consciousness: awake ?Pain management: pain level controlled ?Vital Signs Assessment: post-procedure vital signs reviewed and stable ?Respiratory status: spontaneous breathing ?Cardiovascular status: stable ?Postop Assessment: no apparent nausea or vomiting ?Anesthetic complications: no ? ? ?No notable events documented. ? ?Last Vitals:  ?Vitals:  ? 11/20/21 1137 11/20/21 1146  ?BP: 123/74 112/77  ?Pulse: (!) 57 68  ?Resp: 16 16  ?Temp:  36.6 ?C  ?SpO2: 97% 100%  ?  ?Last Pain:  ?Vitals:  ? 11/20/21 1146  ?TempSrc:   ?PainSc: 0-No pain  ? ? ?  ?  ?  ?  ?  ?  ? ?Huston Foley ? ? ? ? ?

## 2021-11-20 NOTE — Transfer of Care (Signed)
Immediate Anesthesia Transfer of Care Note ? ?Patient: LYBERTI THRUSH ? ?Procedure(s) Performed: LEFT CARPAL TUNNEL RELEASE (Left: Hand) ? ?Patient Location: PACU ? ?Anesthesia Type:MAC ? ?Level of Consciousness: awake, alert  and oriented ? ?Airway & Oxygen Therapy: Patient Spontanous Breathing and Patient connected to face mask oxygen ? ?Post-op Assessment: Report given to RN and Post -op Vital signs reviewed and stable ? ?Post vital signs: Reviewed and stable ? ?Last Vitals:  ?Vitals Value Taken Time  ?BP 133/108 11/20/21 1126  ?Temp    ?Pulse 53 11/20/21 1128  ?Resp 19 11/20/21 1128  ?SpO2 97 % 11/20/21 1128  ?Vitals shown include unvalidated device data. ? ?Last Pain:  ?Vitals:  ? 11/20/21 0926  ?TempSrc: Oral  ?PainSc: 0-No pain  ?   ? ?Patients Stated Pain Goal: 3 (11/20/21 2094) ? ?Complications: No notable events documented. ?

## 2021-11-20 NOTE — Interval H&P Note (Signed)
History and Physical Interval Note: ? ?11/20/2021 ?10:07 AM ? ?Lindsay Wade  has presented today for surgery, with the diagnosis of LEFT CARPAL TUNNEL SYNDROME.  The various methods of treatment have been discussed with the patient and family. After consideration of risks, benefits and other options for treatment, the patient has consented to  Procedure(s): ?LEFT CARPAL TUNNEL RELEASE (Left) as a surgical intervention.  The patient's history has been reviewed, patient examined, no change in status, stable for surgery.  I have reviewed the patient's chart and labs.  Questions were answered to the patient's satisfaction.   ? ? ?[name] ? ? ?

## 2021-11-20 NOTE — Anesthesia Preprocedure Evaluation (Signed)
Anesthesia Evaluation  ?Patient identified by MRN, date of birth, ID band ?Patient awake ? ? ? ?Reviewed: ?Allergy & Precautions, NPO status , Patient's Chart, lab work & pertinent test results ? ?History of Anesthesia Complications ?(+) PONV and history of anesthetic complications ? ?Airway ?Mallampati: III ? ? ? ? ? ? Dental ?no notable dental hx. ? ?  ?Pulmonary ?sleep apnea and Continuous Positive Airway Pressure Ventilation ,  ?  ?Pulmonary exam normal ? ? ? ? ? ? ? Cardiovascular ?hypertension, Pt. on medications ?Normal cardiovascular exam ? ? ?  ?Neuro/Psych ? Headaches,  Neuromuscular disease negative psych ROS  ? GI/Hepatic ?Neg liver ROS,   ?Endo/Other  ?Hypothyroidism Morbid obesity ? Renal/GU ?negative Renal ROS  ?negative genitourinary ?  ?Musculoskeletal ? ? Abdominal ?(+) + obese,   ?Peds ? Hematology ?  ?Anesthesia Other Findings ? ? Reproductive/Obstetrics ?negative OB ROS ? ?  ? ? ? ? ? ? ? ? ? ? ? ? ? ?  ?  ? ? ? ? ? ? ? ? ?Anesthesia Physical ?Anesthesia Plan ? ?ASA: 3 ? ?Anesthesia Plan: MAC  ? ?Post-op Pain Management: Minimal or no pain anticipated  ? ?Induction: Intravenous ? ?PONV Risk Score and Plan: Propofol infusion, TIVA, Ondansetron, Dexamethasone and Midazolam ? ?Airway Management Planned: Natural Airway, Simple Face Mask and Mask ? ?Additional Equipment: None ? ?Intra-op Plan:  ? ?Post-operative Plan:  ? ?Informed Consent: I have reviewed the patients History and Physical, chart, labs and discussed the procedure including the risks, benefits and alternatives for the proposed anesthesia with the patient or authorized representative who has indicated his/her understanding and acceptance.  ? ? ? ? ? ?Plan Discussed with: CRNA ? ?Anesthesia Plan Comments:   ? ? ? ? ? ? ?Anesthesia Quick Evaluation ? ?

## 2021-11-20 NOTE — Discharge Instructions (Addendum)
 Charles Benfield, M.D. Hand Surgery  POST-OPERATIVE DISCHARGE INSTRUCTIONS   PRESCRIPTIONS: - You have been given a prescription to be taken as directed for post-operative pain control.  You may also take over the counter ibuprofen/aleve and tylenol for pain. Take this as directed on the packaging. Do not exceed 3000 mg tylenol/acetaminophen in 24 hours.  Ibuprofen 600-800 mg (3-4) tablets by mouth every 6 hours as needed for pain.   OR  Aleve 2 tablets by mouth every 12 hours (twice daily) as needed for pain.   AND/OR  Tylenol 1000 mg (2 tablets) every 8 hours as needed for pain.  - Please use your pain medication carefully, as refills are limited and you may not be provided with one.  As stated above, please use over the counter pain medicine - it will also be helpful with decreasing your swelling.    ANESTHESIA: -After your surgery, post-surgical discomfort or pain is likely. This discomfort can last several days to a few weeks. At certain times of the day your discomfort may be more intense.   Did you receive a nerve block?   - A nerve block can provide pain relief for one hour to two days after your surgery. As long as the nerve block is working, you will experience little or no sensation in the area the surgeon operated on.  - As the nerve block wears off, you will begin to experience pain or discomfort. It is very important that you begin taking your prescribed pain medication before the nerve block fully wears off. Treating your pain at the first sign of the block wearing off will ensure your pain is better controlled and more tolerable when full-sensation returns. Do not wait until the pain is intolerable, as the medicine will be less effective. It is better to treat pain in advance than to try and catch up.   General Anesthesia:  If you did not receive a nerve block during your surgery, you will need to start taking your pain medication shortly after your surgery and  should continue to do so as prescribed by your surgeon.     ICE AND ELEVATION: - You may use ice for the first 48-72 hours, but it is not critical.   - Motion of your fingers is very important to decrease the swelling.  - Elevation, as much as possible for the next 48 hours, is critical for decreasing swelling as well as for pain relief. Elevation means when you are seated or lying down, you hand should be at or above your heart. When walking, the hand needs to be at or above the level of your elbow.  - If the bandage gets too tight, it may need to be loosened. Please contact our office and we will instruct you in how to do this.    SURGICAL BANDAGES:  - Keep your dressing and/or splint clean and dry at all times.  You can remove your dressing 4 days from now and change with a dry dressing or Band-Aids as needed thereafter. - You may place a plastic bag over your bandage to shower, but be careful, do not get your bandages wet.  - After the bandages have been removed, it is OK to get the stitches wet in a shower or with hand washing. Do Not soak or submerge the wound yet. Please do not use lotions or creams on the stitches.      HAND THERAPY:  - You may not need any. If you do,   we will begin this at your follow up visit in the clinic.  ? ? ?ACTIVITY AND WORK: ?- You are encouraged to move any fingers which are not in the bandage.  ?- Light use of the fingers is allowed to assist the other hand with daily hygiene and eating, but strong gripping or lifting is often uncomfortable and should be avoided.  ?- You might miss a variable period of time from work and hopefully this issue has been discussed prior to surgery. You may not do any heavy work with your affected hand for about 2 weeks.  ? ? ?[facility] ?[address] ?[place],    [id number] ?[phone]  ? ? ?May take NSAIDS (ibuprofen, motrin) after 5:20pm, if needed.  ? ? ?Post Anesthesia Home Care  Instructions ? ?Activity: ?Get plenty of rest for the remainder of the day. A responsible individual must stay with you for 24 hours following the procedure.  ?For the next 24 hours, DO NOT: ?-Drive a car ?-Paediatric nurse ?-Drink alcoholic beverages ?-Take any medication unless instructed by your physician ?-Make any legal decisions or sign important papers. ? ?Meals: ?Start with liquid foods such as gelatin or soup. Progress to regular foods as tolerated. Avoid greasy, spicy, heavy foods. If nausea and/or vomiting occur, drink only clear liquids until the nausea and/or vomiting subsides. Call your physician if vomiting continues. ? ?Special Instructions/Symptoms: ?Your throat may feel dry or sore from the anesthesia or the breathing tube placed in your throat during surgery. If this causes discomfort, gargle with warm salt water. The discomfort should disappear within 24 hours. ? ?If you had a scopolamine patch placed behind your ear for the management of post- operative nausea and/or vomiting: ? ?1. The medication in the patch is effective for 72 hours, after which it should be removed.  Wrap patch in a tissue and discard in the trash. Wash hands thoroughly with soap and water. ?2. You may remove the patch earlier than 72 hours if you experience unpleasant side effects which may include dry mouth, dizziness or visual disturbances. ?3. Avoid touching the patch. Wash your hands with soap and water after contact with the patch. ?    ?

## 2021-11-20 NOTE — Op Note (Signed)
? ?  Date of Surgery: 11/20/2021 ? ?INDICATIONS: Patient is a 56 y.o.-year-old female with left carpal tunnel syndrome that has failed conservative management.  Risks, benefits, and alternatives to surgery were again discussed with the patient in the preoperative area. The patient wishes to proceed with surgery.  Informed consent was signed after our discussion.  ? ?PREOPERATIVE DIAGNOSIS:  ?Left carpal tunnel syndrome ? ? ?POSTOPERATIVE DIAGNOSIS: Same. ? ?PROCEDURE:  ?Left carpal tunnel release ? ? ?SURGEON: Audria Nine, M.D. ? ?ASSIST:  ? ?ANESTHESIA:  Local, MAC ? ?IV FLUIDS AND URINE: See anesthesia. ? ?ESTIMATED BLOOD LOSS: <5 mL. ? ?IMPLANTS: * No implants in log *  ? ?DRAINS: None ? ?COMPLICATIONS: None ? ?DESCRIPTION OF PROCEDURE: The patient was met in the preoperative holding area where the surgical site was marked and the consent form was verified.  The patient was then taken to the operating room and transferred to the operating table.  All bony prominences were well padded.  A tourniquet was applied to the left forearm.  Monitored sedation was induced.   A formal time-out was performed to confirm that this was the correct patient, surgery, side, and site. Following timeout, a local block was performed using 1% plain lidocaine. The operative extremity was then prepped and draped in the usual and sterile fashion.   ? ?Following a second timeout, the limb was exsanguinated and the tourniquet inflated to 250 mmHg.  A longitudinal incision was made in line with the radial border of the ring finger from distal to the wrist flexion crease to the intersection of Kaplan's cardinal line.  The skin and subcutaneous tissue was sharply divided.  The longitudinally running palmar fascia was incised.  The thenar musculature was bluntly swept off of the transverse carpal ligament.  The ligament was divided from proximal to distal until the fat surrounding the palmar arch was encountered. Retractors were then placed  in the proximal aspect of the wound to visualize the distal antebrachial fascia.  The fascia was sharply divided under direct visualization.   The wound was then thoroughly irrigated with sterile saline.  The tourniquet was deflated.  Hemostasis was achieved with direct pressure and bipolar electrocautery.  The wound was then closed with 4-0 nylon sutures in a horizontal mattress fashion. The wound was then dressed with xeroform, folded kerlix, and an ace wrap. ? ?The patient was then reversed from anesthesia and transferred to the postoperative bed.  All counts were correct x 2 at the end of the procedure.  The patient was taken to the recovery unit in stable condition.    ? ? ?POSTOPERATIVE PLAN: Patient will be discharged to home with appropriate pain medication and discharge instructions.  I will see her back in 10-14 days for her first postop visit.  ? ?Audria Nine, MD ?11:24 AM  ?

## 2021-11-21 NOTE — Progress Notes (Signed)
Left message stating courtesy call and if any questions or concerns please call the doctors office.  

## 2021-11-22 ENCOUNTER — Ambulatory Visit: Payer: 59 | Admitting: Orthopaedic Surgery

## 2021-11-22 ENCOUNTER — Other Ambulatory Visit (HOSPITAL_BASED_OUTPATIENT_CLINIC_OR_DEPARTMENT_OTHER): Payer: Self-pay

## 2021-11-22 MED ORDER — PANTOPRAZOLE SODIUM 40 MG PO TBEC
40.0000 mg | DELAYED_RELEASE_TABLET | Freq: Every day | ORAL | 1 refills | Status: DC
  Filled 2021-11-22: qty 30, 30d supply, fill #0

## 2021-11-25 ENCOUNTER — Telehealth: Payer: Self-pay

## 2021-11-25 ENCOUNTER — Telehealth: Payer: Self-pay | Admitting: Orthopedic Surgery

## 2021-11-25 MED ORDER — PANTOPRAZOLE SODIUM 40 MG PO TBEC: 40.0000 mg | DELAYED_RELEASE_TABLET | Freq: Every day | ORAL | 1 refills | Status: DC

## 2021-11-25 NOTE — Telephone Encounter (Signed)
Pt called requesting a letter with her surgery date. What type of surgery she had, and estimated time off work needed. Please send to mychart. Pt phone number is [phone]

## 2021-11-25 NOTE — Telephone Encounter (Signed)
Patient returned call asked for a call back. The number to contact patient is [phone]

## 2021-11-25 NOTE — Telephone Encounter (Signed)
Patient called triage. She said that she had left carpal tunnel release on Wed 11/20/2021. She is having thumb numbness and pain. She has started to wear her brace. She said that she feels like something is not right. She has multiple complaints including wrist pain to her elbow. She used pain medicine the day of and at bedtime. All this started Sunday. She wants to talk about what to expect in the healing process. Please call to advise [phone]

## 2021-11-25 NOTE — Telephone Encounter (Signed)
Contacted patient however had to leave a voicemail to see what type of work patient does so that letter can be created for her.

## 2021-11-25 NOTE — Telephone Encounter (Signed)
This is being addressed in a different encounter.

## 2021-11-25 NOTE — Addendum Note (Signed)
Addended by: Lurlean Nanny on: 11/25/2021 05:37 PM   Modules accepted: Orders

## 2021-11-25 NOTE — Telephone Encounter (Signed)
Contacted patient and made her aware that letter would be created after she has appointment with Dr. Tempie Donning on 5/23/203.

## 2021-11-26 ENCOUNTER — Ambulatory Visit (INDEPENDENT_AMBULATORY_CARE_PROVIDER_SITE_OTHER): Payer: 59 | Admitting: Orthopedic Surgery

## 2021-11-26 DIAGNOSIS — G5602 Carpal tunnel syndrome, left upper limb: Secondary | ICD-10-CM

## 2021-11-26 NOTE — Progress Notes (Signed)
Post-Op Visit Note   Patient: Lindsay Wade           Date of Birth: Sep 15, 1965           MRN: [id number] Visit Date: 11/26/2021 PCP: [name], DO   Assessment & Plan:  Chief Complaint:  Chief Complaint  Patient presents with   Left Thumb - Numbness   Left Hand - Routine Post Op   Visit Diagnoses:  1. Carpal tunnel syndrome, left upper limb     Plan: Patient presents with continued pain in the proximal to mid forearm with numbness in her thumb.  Discussed with patient that her forearm pain is likely prolonged pain from the tourniquet.  She has subjective numbness in the thumb but has 55m 2PD which is similar to her preop exam.  She has 4/5 thenar motor strength with opposition and palmar abduction which is also the same as preop.  She has extensive bruising in the palm and distal forearm.  Discussed with patient that these symptoms will likely improve as she progresses in the postoperative course.  I will see her again to have the sutures removed.    Follow-Up Instructions: No follow-ups on file.   Orders:  No orders of the defined types were placed in this encounter.  No orders of the defined types were placed in this encounter.   Imaging: No results found.  PMFS History: Patient Active Problem List   Diagnosis Date Noted   Carpal tunnel syndrome, left upper limb    Bilateral carpal tunnel syndrome 11/08/2021   Screen for colon cancer    Polyp of sigmoid colon    Gastroesophageal reflux disease with esophagitis without hemorrhage    Joint synovitis 07/03/2020   Chest wall pain 07/03/2020   Obstructive sleep apnea treated with BiPAP 10/06/2017   Occipital neuralgia of right side 06/03/2017   Uncontrolled morning headache 05/26/2017   HTN (hypertension), malignant 05/26/2017   Morbid obesity ([place] 05/26/2017   Snoring 05/26/2017   Occipital headache 04/24/2017   Essential hypertension 04/15/2017   Hypothyroid    Past Medical History:   Diagnosis Date   Essential hypertension 04/15/2017   GERD (gastroesophageal reflux disease)    Headache    Hypothyroid    Hypothyroidism    MRSA infection    OSA (obstructive sleep apnea)    CPAP   PONV (postoperative nausea and vomiting)    after tonsillectomy    Family History  Problem Relation Age of Onset   Hypertension Mother    Diabetes Mother        TYPE 2    Hyperlipidemia Mother    Thyroid disease Mother    Hypertension Father    Parkinsonism Father    Hyperlipidemia Father    Stroke Father    Hyperlipidemia Sister    Colon cancer Neg Hx    Stomach cancer Neg Hx    Pancreatic cancer Neg Hx     Past Surgical History:  Procedure Laterality Date   BTS     CARPAL TUNNEL RELEASE Left 11/20/2021   Procedure: LEFT CARPAL TUNNEL RELEASE;  Surgeon: BSherilyn Cooter MD;  Location[facility]  Service: Orthopedics;  Laterality: Left;   CESAREAN SECTION     COLONOSCOPY WITH PROPOFOL N/A 03/15/2021   Procedure: COLONOSCOPY WITH PROPOFOL;  Surgeon: WLucilla Lame MD;  Location: [facility]  Service: Endoscopy;  Laterality: N/A;   ESOPHAGOGASTRODUODENOSCOPY (EGD) WITH PROPOFOL N/A 03/15/2021   Procedure: ESOPHAGOGASTRODUODENOSCOPY (EGD) and dillation WITH  PROPOFOL;  Surgeon: Lucilla Lame, MD;  Location: [facility];  Service: Endoscopy;  Laterality: N/A;  sleep apnea requests early   TONSILLECTOMY     uterine ablation  2005   Social History   Occupational History   Occupation: Education officer, museum    Employer: [employer]  Tobacco Use   Smoking status: Never   Smokeless tobacco: Never  Vaping Use   Vaping Use: Never used  Substance and Sexual Activity   Alcohol use: Not Currently    Comment: social drinker; very rare    Drug use: No   Sexual activity: Yes    Partners: Male    Birth control/protection: Surgical

## 2021-11-27 ENCOUNTER — Other Ambulatory Visit (HOSPITAL_BASED_OUTPATIENT_CLINIC_OR_DEPARTMENT_OTHER): Payer: Self-pay

## 2021-11-28 ENCOUNTER — Encounter: Payer: 59 | Admitting: Orthopedic Surgery

## 2021-12-03 ENCOUNTER — Ambulatory Visit (INDEPENDENT_AMBULATORY_CARE_PROVIDER_SITE_OTHER): Payer: 59 | Admitting: Orthopedic Surgery

## 2021-12-03 DIAGNOSIS — G5602 Carpal tunnel syndrome, left upper limb: Secondary | ICD-10-CM

## 2021-12-03 NOTE — Progress Notes (Signed)
Post-Op Visit Note   Patient: Lindsay Wade           Date of Birth: August 22, 1965           MRN: [id number] Visit Date: 12/03/2021 PCP: [name], DO   Assessment & Plan:  Chief Complaint:  Chief Complaint  Patient presents with   Left Hand - Routine Post Op    CTR ON 11/20/21   Visit Diagnoses: No diagnosis found.  Plan: Patient is 13 days status post left carpal tunnel release. Her ecchymosis has resolved. She initially had numbness in the thumb with some pillar pain.  The thumb numbness is improving.  She still has 5/5 thenar motor strength.  She still has pain in the thenar and hypothenar eminence that seems consistent with pillar pain.  She has no numbness in the remainder of her fingers.  Her previous numbness, paresthesias, and nocturnal symptoms have resolved.  I can see her back in 2 weeks to see how she is progressing.  Follow-Up Instructions: No follow-ups on file.   Orders:  No orders of the defined types were placed in this encounter.  No orders of the defined types were placed in this encounter.   Imaging: No results found.  PMFS History: Patient Active Problem List   Diagnosis Date Noted   Carpal tunnel syndrome, left upper limb    Bilateral carpal tunnel syndrome 11/08/2021   Screen for colon cancer    Polyp of sigmoid colon    Gastroesophageal reflux disease with esophagitis without hemorrhage    Joint synovitis 07/03/2020   Chest wall pain 07/03/2020   Obstructive sleep apnea treated with BiPAP 10/06/2017   Occipital neuralgia of right side 06/03/2017   Uncontrolled morning headache 05/26/2017   HTN (hypertension), malignant 05/26/2017   Morbid obesity ([place]) 05/26/2017   Snoring 05/26/2017   Occipital headache 04/24/2017   Essential hypertension 04/15/2017   Hypothyroid    Past Medical History:  Diagnosis Date   Essential hypertension 04/15/2017   GERD (gastroesophageal reflux disease)    Headache    Hypothyroid     Hypothyroidism    MRSA infection    OSA (obstructive sleep apnea)    CPAP   PONV (postoperative nausea and vomiting)    after tonsillectomy    Family History  Problem Relation Age of Onset   Hypertension Mother    Diabetes Mother        TYPE 2    Hyperlipidemia Mother    Thyroid disease Mother    Hypertension Father    Parkinsonism Father    Hyperlipidemia Father    Stroke Father    Hyperlipidemia Sister    Colon cancer Neg Hx    Stomach cancer Neg Hx    Pancreatic cancer Neg Hx     Past Surgical History:  Procedure Laterality Date   BTS     CARPAL TUNNEL RELEASE Left 11/20/2021   Procedure: LEFT CARPAL TUNNEL RELEASE;  Surgeon: Sherilyn Cooter, MD;  Location: [facility];  Service: Orthopedics;  Laterality: Left;   CESAREAN SECTION     COLONOSCOPY WITH PROPOFOL N/A 03/15/2021   Procedure: COLONOSCOPY WITH PROPOFOL;  Surgeon: Lucilla Lame, MD;  Location: [facility];  Service: Endoscopy;  Laterality: N/A;   ESOPHAGOGASTRODUODENOSCOPY (EGD) WITH PROPOFOL N/A 03/15/2021   Procedure: ESOPHAGOGASTRODUODENOSCOPY (EGD) and dillation WITH PROPOFOL;  Surgeon: Lucilla Lame, MD;  Location: [facility];  Service: Endoscopy;  Laterality: N/A;  sleep apnea requests early   TONSILLECTOMY  uterine ablation  2005   Social History   Occupational History   Occupation: Education officer, museum    Employer: [employer]  Tobacco Use   Smoking status: Never   Smokeless tobacco: Never  Vaping Use   Vaping Use: Never used  Substance and Sexual Activity   Alcohol use: Not Currently    Comment: social drinker; very rare    Drug use: No   Sexual activity: Yes    Partners: Male    Birth control/protection: Surgical

## 2021-12-05 ENCOUNTER — Telehealth: Payer: Self-pay | Admitting: Orthopedic Surgery

## 2021-12-05 NOTE — Telephone Encounter (Signed)
Patient called asked if she can get a letter stating the first day of care which was Nov 20, 2021 and the procedure that was done. The number to contact patient is [phone]

## 2021-12-06 ENCOUNTER — Other Ambulatory Visit (HOSPITAL_BASED_OUTPATIENT_CLINIC_OR_DEPARTMENT_OTHER): Payer: Self-pay

## 2021-12-06 ENCOUNTER — Encounter: Payer: Self-pay | Admitting: Radiology

## 2021-12-06 NOTE — Telephone Encounter (Signed)
Letter mailed to patient.

## 2021-12-09 ENCOUNTER — Other Ambulatory Visit (HOSPITAL_BASED_OUTPATIENT_CLINIC_OR_DEPARTMENT_OTHER): Payer: Self-pay

## 2021-12-10 ENCOUNTER — Encounter: Payer: Self-pay | Admitting: Family Medicine

## 2021-12-17 ENCOUNTER — Ambulatory Visit (INDEPENDENT_AMBULATORY_CARE_PROVIDER_SITE_OTHER): Payer: 59 | Admitting: Orthopedic Surgery

## 2021-12-17 DIAGNOSIS — G5603 Carpal tunnel syndrome, bilateral upper limbs: Secondary | ICD-10-CM

## 2021-12-17 DIAGNOSIS — G5602 Carpal tunnel syndrome, left upper limb: Secondary | ICD-10-CM

## 2021-12-17 NOTE — Progress Notes (Signed)
Post-Op Visit Note   Patient: Lindsay Wade           Date of Birth: June 04, 1966           MRN: [id number] Visit Date: 12/17/2021 PCP: [name], DO   Assessment & Plan:  Chief Complaint:  Chief Complaint  Patient presents with   Left Hand - Routine Post Op   Visit Diagnoses:  1. Bilateral carpal tunnel syndrome   2. Carpal tunnel syndrome, left upper limb     Plan: Patient is under 4 weeks s/p L CTR.  Her symptoms seem to be improving.  She has normal sensation in her thumb w/ full ROM and thenar motor strength.  She complains of aching soreness in the wrist that seems consistent with pillar pain.  She describes fatigue and weakness in the wrist with cooking and cleaning.  The incision is well healed.  She denies any continued numbness in the median nerve distribution with no nocturnal symptoms.  I will refer her to hand therapy to work on strengthening and scar desensitization.   Follow-Up Instructions: No follow-ups on file.   Orders:  No orders of the defined types were placed in this encounter.  No orders of the defined types were placed in this encounter.   Imaging: No results found.  PMFS History: Patient Active Problem List   Diagnosis Date Noted   Carpal tunnel syndrome, left upper limb    Bilateral carpal tunnel syndrome 11/08/2021   Screen for colon cancer    Polyp of sigmoid colon    Gastroesophageal reflux disease with esophagitis without hemorrhage    Joint synovitis 07/03/2020   Chest wall pain 07/03/2020   Obstructive sleep apnea treated with BiPAP 10/06/2017   Occipital neuralgia of right side 06/03/2017   Uncontrolled morning headache 05/26/2017   HTN (hypertension), malignant 05/26/2017   Morbid obesity ([place]) 05/26/2017   Snoring 05/26/2017   Occipital headache 04/24/2017   Essential hypertension 04/15/2017   Hypothyroid    Past Medical History:  Diagnosis Date   Essential hypertension 04/15/2017   GERD (gastroesophageal  reflux disease)    Headache    Hypothyroid    Hypothyroidism    MRSA infection    OSA (obstructive sleep apnea)    CPAP   PONV (postoperative nausea and vomiting)    after tonsillectomy    Family History  Problem Relation Age of Onset   Hypertension Mother    Diabetes Mother        TYPE 2    Hyperlipidemia Mother    Thyroid disease Mother    Hypertension Father    Parkinsonism Father    Hyperlipidemia Father    Stroke Father    Hyperlipidemia Sister    Colon cancer Neg Hx    Stomach cancer Neg Hx    Pancreatic cancer Neg Hx     Past Surgical History:  Procedure Laterality Date   BTS     CARPAL TUNNEL RELEASE Left 11/20/2021   Procedure: LEFT CARPAL TUNNEL RELEASE;  Surgeon: Sherilyn Cooter, MD;  Location: [facility];  Service: Orthopedics;  Laterality: Left;   CESAREAN SECTION     COLONOSCOPY WITH PROPOFOL N/A 03/15/2021   Procedure: COLONOSCOPY WITH PROPOFOL;  Surgeon: Lucilla Lame, MD;  Location: [facility];  Service: Endoscopy;  Laterality: N/A;   ESOPHAGOGASTRODUODENOSCOPY (EGD) WITH PROPOFOL N/A 03/15/2021   Procedure: ESOPHAGOGASTRODUODENOSCOPY (EGD) and dillation WITH PROPOFOL;  Surgeon: Lucilla Lame, MD;  Location: [facility];  Service: Endoscopy;  Laterality: N/A;  sleep apnea requests early   TONSILLECTOMY     uterine ablation  2005   Social History   Occupational History   Occupation: Education officer, museum    Employer: [employer]  Tobacco Use   Smoking status: Never   Smokeless tobacco: Never  Vaping Use   Vaping Use: Never used  Substance and Sexual Activity   Alcohol use: Not Currently    Comment: social drinker; very rare    Drug use: No   Sexual activity: Yes    Partners: Male    Birth control/protection: Surgical

## 2021-12-20 ENCOUNTER — Ambulatory Visit: Payer: 59 | Admitting: Physical Therapy

## 2021-12-27 ENCOUNTER — Other Ambulatory Visit (HOSPITAL_BASED_OUTPATIENT_CLINIC_OR_DEPARTMENT_OTHER): Payer: Self-pay

## 2021-12-30 ENCOUNTER — Ambulatory Visit: Payer: 59 | Admitting: Physical Therapy

## 2022-01-03 ENCOUNTER — Encounter: Payer: Self-pay | Admitting: Rehabilitative and Restorative Service Providers"

## 2022-01-03 ENCOUNTER — Other Ambulatory Visit: Payer: Self-pay

## 2022-01-03 ENCOUNTER — Ambulatory Visit (INDEPENDENT_AMBULATORY_CARE_PROVIDER_SITE_OTHER): Payer: 59 | Admitting: Rehabilitative and Restorative Service Providers"

## 2022-01-03 DIAGNOSIS — M25532 Pain in left wrist: Secondary | ICD-10-CM | POA: Diagnosis not present

## 2022-01-03 DIAGNOSIS — M6281 Muscle weakness (generalized): Secondary | ICD-10-CM | POA: Diagnosis not present

## 2022-01-03 DIAGNOSIS — M25642 Stiffness of left hand, not elsewhere classified: Secondary | ICD-10-CM

## 2022-01-03 DIAGNOSIS — R278 Other lack of coordination: Secondary | ICD-10-CM | POA: Diagnosis not present

## 2022-01-03 DIAGNOSIS — R600 Localized edema: Secondary | ICD-10-CM | POA: Diagnosis not present

## 2022-01-03 NOTE — Therapy (Signed)
OUTPATIENT OCCUPATIONAL THERAPY ORTHO EVALUATION  Patient Name: Lindsay Wade MRN: [id number] DOB:[birth year], 56 y.o., female Today's Date: 01/03/2022  PCP: Dr. Silas Flood REFERRING PROVIDER: Dr. Sherilyn Cooter    OT End of Session - 01/03/22 0850     Visit Number 1    Number of Visits 10    Date for OT Re-Evaluation 02/14/22    OT Start Time 0850    OT Stop Time 0938    OT Time Calculation (min) 48 min    Activity Tolerance Patient tolerated treatment well;No increased pain;Patient limited by fatigue;Patient limited by pain    Behavior During Therapy [facility] for tasks assessed/performed             Past Medical History:  Diagnosis Date   Essential hypertension 04/15/2017   GERD (gastroesophageal reflux disease)    Headache    Hypothyroid    Hypothyroidism    MRSA infection    OSA (obstructive sleep apnea)    CPAP   PONV (postoperative nausea and vomiting)    after tonsillectomy   Past Surgical History:  Procedure Laterality Date   BTS     CARPAL TUNNEL RELEASE Left 11/20/2021   Procedure: LEFT CARPAL TUNNEL RELEASE;  Surgeon: Sherilyn Cooter, MD;  Location: [place];  Service: Orthopedics;  Laterality: Left;   CESAREAN SECTION     COLONOSCOPY WITH PROPOFOL N/A 03/15/2021   Procedure: COLONOSCOPY WITH PROPOFOL;  Surgeon: Lucilla Lame, MD;  Location: [place];  Service: Endoscopy;  Laterality: N/A;   ESOPHAGOGASTRODUODENOSCOPY (EGD) WITH PROPOFOL N/A 03/15/2021   Procedure: ESOPHAGOGASTRODUODENOSCOPY (EGD) and dillation WITH PROPOFOL;  Surgeon: Lucilla Lame, MD;  Location: [place];  Service: Endoscopy;  Laterality: N/A;  sleep apnea requests early   TONSILLECTOMY     uterine ablation  2005   Patient Active Problem List   Diagnosis Date Noted   Carpal tunnel syndrome, left upper limb    Bilateral carpal tunnel syndrome 11/08/2021   Screen for colon cancer    Polyp of sigmoid colon    Gastroesophageal reflux disease  with esophagitis without hemorrhage    Joint synovitis 07/03/2020   Chest wall pain 07/03/2020   Obstructive sleep apnea treated with BiPAP 10/06/2017   Occipital neuralgia of right side 06/03/2017   Uncontrolled morning headache 05/26/2017   HTN (hypertension), malignant 05/26/2017   Morbid obesity ([place]) 05/26/2017   Snoring 05/26/2017   Occipital headache 04/24/2017   Essential hypertension 04/15/2017   Hypothyroid     ONSET DATE: DOS 11/20/21  REFERRING DIAG: G56.02 (ICD-10-CM) - Carpal tunnel syndrome, left upper limb  THERAPY DIAG:  Pain in left wrist  Other lack of coordination  Muscle weakness (generalized)  Localized edema  Stiffness of left hand, not elsewhere classified  Rationale for Evaluation and Treatment Rehabilitation  SUBJECTIVE:   SUBJECTIVE STATEMENT: She states having CTS for 7+ years and she has lost her numbness for the most part, but having a lot of hypersensitivity around scar and pain in wrist. She states she  can't wear her watch, can't hold her phone, etc. She is going back to work next week and primarily uses computer. She has FMS, ADL and IADL problems now and pain. She also complains about a tight "tourniquet" feeling around distal humerus since sx and past hx of "neck grinding" sensations. (She was going to get tx for this, but has been on hold since CTR.)    PERTINENT HISTORY: Per MD: "L CTR on 11/20/21.  Postop aching and  weakness in wrist w/ activity.  Sensitivity around incision."  PRECAUTIONS: 6 weeks,   WEIGHT BEARING RESTRICTIONS  WBAT but avoid heavy   PAIN:  Are you having pain? Yes 5/10 at rest and when moving, using  FALLS: Has patient fallen in last 6 months? No  LIVING ENVIRONMENT: Lives with: lives with their family   PLOF: Independent  PATIENT GOALS To get her hand feeling better to do the things that she needs to do.    OBJECTIVE:   HAND DOMINANCE: Right   ADLs: Overall ADLs: States recent decreased use of  hand and learning bad behaviors due to pain. Decreased ability to grab, hold household objects, pain and inability to open containers, perform all FMS tasks, etc.    FUNCTIONAL OUTCOME MEASURES: Eval: Patient Specific Functional Scale: 2.3 (pick up dog, wearing watch, cleaning)  UPPER EXTREMITY ROM    Eval: she makes loose full fist and has some pain/tightness opposing thumb to SF today, shows hypersensitive symptoms and some stiffness.   Active ROM Left eval  Elbow flexion   Elbow extension   Wrist flexion 54  Wrist extension 74  Wrist ulnar deviation   Wrist radial deviation   Wrist pronation   Wrist supination   (Blank rows = not tested)    UPPER EXTREMITY MMT:     MMT Left eval  Elbow flexion   Elbow extension   Wrist flexion 4-/5 pain  Wrist extension 4-/5 pain  Wrist ulnar deviation   Wrist radial deviation   Wrist pronation   Wrist supination   (Blank rows = not tested)  HAND FUNCTION: Eval: Grip strength Right: 71.5 lbs, Left: 42 lbs painful  COORDINATION: Eval: 9 Hole Peg Test Right: Left: 27.6 sec (26 sec is WFL, 20sec is avg)   SENSATION: Eval: Light touch intact today, though diminished around sx area  & overtly hypersensitive around palm and scar.   EDEMA:   Eval:  Mildly swollen in palm today and red. And circumferential measure is = to right hand, however.   COGNITION: Overall cognitive status: WFL for evaluation today   OBSERVATIONS:   Eval: red and puffy around left palmar scar area now, flinching and overtly hypersensitive.    TODAY'S TREATMENT:  Eval: OT educates on most important parts of program today: watching habits that place pressure, pain, or strong force on lt CTR now (self-care); starting systematic, progressive desensitization program as tol 5-6x day for 1-2 mins (neuro re-education); and performing light tolerable funl activities and avoiding total disuse of hand/arm.  She states understanding these and trials desensitization with  lotion and LT and also vibration tool today and she states feeling much better when done. OT also edu on median nerve glides (neuro re-ed) to be done proximally and distally, lightly, carefully 3-4 x day for 2-3 mins. She does well with these as well. No questions at end of eval/tx.    PATIENT EDUCATION: Education details: See tx section above for details  Person educated: Patient Education method: Verbal Instruction, Teach back, Handouts  Education comprehension: States and demonstrates understanding, Additional Education required    HOME EXERCISE PROGRAM: See tx section above for details   GOALS: Goals reviewed with patient? Yes   SHORT TERM GOALS: (STG required if POC>30 days)  Pt will obtain protective, custom orthotic. Target date: TBD PRN (she has bulky pre-fab braces)  Goal status: MET  2.  Pt will demo/state understanding of initial HEP to improve pain levels and prerequisite motion. Target date: 01/10/22 Goal  status: INITIAL   LONG TERM GOALS:  Pt will improve functional ability by decreased impairment per PSFS assessment from 2.3 to 7 or better, for better quality of life. Target date: 02/14/22 Goal status: INITIAL  2.  Pt will improve grip strength in left hand from painful 42lbs to at least 55lbs for functional use at home and in IADLs. Target date: 02/14/22 Goal status: INITIAL  3.  Pt will improve A/ROM in left wrist flex from 54* to at least 65*, to have functional motion for tasks like reach and grasp.  Target date: 02/14/22 Goal status: INITIAL  4.  Pt will improve strength in whole left arm from inhibited painful weak 4-/5 MMT to at least 4+/5 MMT with no major pain to have increased functional ability to carry out selfcare and higher-level homecare tasks with no difficulty. Target date: 02/14/22 Goal status: INITIAL  5.  Pt will decrease pain at rest from 5/10 to 1/10 or better to have better sleep and occupational participation in daily roles. Target date:  02/14/22 Goal status: INITIAL    ASSESSMENT:  CLINICAL IMPRESSION: Patient is a 56 y.o. female who was seen today for occupational therapy evaluation for left hand pain, hypersensitivity, weakness from nerve compression and subsequent sx.   PERFORMANCE DEFICITS in functional skills including ADLs, IADLs, coordination, dexterity, proprioception, sensation, tone, ROM, strength, pain, fascial restrictions, flexibility, FMC, endurance, decreased knowledge of precautions, and UE functional use, cognitive skills including problem solving and safety awareness, and psychosocial skills including coping strategies, environmental adaptation, habits, and routines and behaviors.   IMPAIRMENTS are limiting patient from ADLs, IADLs, work, play, and leisure.   COMORBIDITIES may have co-morbidities  that affects occupational performance. Patient will benefit from skilled OT to address above impairments and improve overall function.  MODIFICATION OR ASSISTANCE TO COMPLETE EVALUATION: No modification of tasks or assist necessary to complete an evaluation.  OT OCCUPATIONAL PROFILE AND HISTORY: Problem focused assessment: Including review of records relating to presenting problem.  CLINICAL DECISION MAKING: LOW - limited treatment options, no task modification necessary  REHAB POTENTIAL: Good  EVALUATION COMPLEXITY: Low      PLAN: OT FREQUENCY: 1-2x/week  OT DURATION: 6 weeks  PLANNED INTERVENTIONS: self care/ADL training, therapeutic exercise, therapeutic activity, neuromuscular re-education, manual therapy, scar mobilization, splinting, ultrasound, fluidotherapy, moist heat, cryotherapy, contrast bath, patient/family education, and coping strategies training  RECOMMENDED OTHER SERVICES: none now - possibly PT in future for "neck grinding" symptoms   CONSULTED AND AGREED WITH PLAN OF CARE: Patient  PLAN FOR NEXT SESSION: check initial HEP and upgrade to stretches and hand strength as tol    Honorio Devol, OTR/L, CHT 01/03/2022, 11:02 AM

## 2022-01-06 ENCOUNTER — Ambulatory Visit: Payer: 59 | Admitting: Physical Therapy

## 2022-01-08 ENCOUNTER — Encounter: Payer: 59 | Admitting: Physical Therapy

## 2022-01-10 ENCOUNTER — Telehealth: Payer: Self-pay | Admitting: Orthopedic Surgery

## 2022-01-10 NOTE — Telephone Encounter (Signed)
Patient called stating that her employer is needing a letter stating that she can go back to work. She said she's already technically been released but her employer is needing it to specifically say " Patient is allowed to return to work" for them to accept it. Patient would like to know if she can pick this up Monday morning/afternoon 7/10. CB # [phone]

## 2022-01-13 ENCOUNTER — Encounter: Payer: 59 | Admitting: Physical Therapy

## 2022-01-13 ENCOUNTER — Encounter: Payer: Self-pay | Admitting: Radiology

## 2022-01-13 NOTE — Telephone Encounter (Signed)
Note has been written and patient is aware to pick up it up downstairs

## 2022-01-17 ENCOUNTER — Ambulatory Visit (INDEPENDENT_AMBULATORY_CARE_PROVIDER_SITE_OTHER): Payer: 59 | Admitting: Rehabilitative and Restorative Service Providers"

## 2022-01-17 ENCOUNTER — Encounter: Payer: Self-pay | Admitting: Rehabilitative and Restorative Service Providers"

## 2022-01-17 ENCOUNTER — Ambulatory Visit (INDEPENDENT_AMBULATORY_CARE_PROVIDER_SITE_OTHER): Payer: 59 | Admitting: Orthopedic Surgery

## 2022-01-17 DIAGNOSIS — M6281 Muscle weakness (generalized): Secondary | ICD-10-CM | POA: Diagnosis not present

## 2022-01-17 DIAGNOSIS — R278 Other lack of coordination: Secondary | ICD-10-CM

## 2022-01-17 DIAGNOSIS — M25532 Pain in left wrist: Secondary | ICD-10-CM | POA: Diagnosis not present

## 2022-01-17 DIAGNOSIS — M25642 Stiffness of left hand, not elsewhere classified: Secondary | ICD-10-CM

## 2022-01-17 DIAGNOSIS — R600 Localized edema: Secondary | ICD-10-CM

## 2022-01-17 DIAGNOSIS — G5602 Carpal tunnel syndrome, left upper limb: Secondary | ICD-10-CM

## 2022-01-17 NOTE — Progress Notes (Signed)
Post-Op Visit Note   Patient: Lindsay Wade           Date of Birth: 10-Dec-1965           MRN: [id number] Visit Date: 01/17/2022 PCP: [name], DO   Assessment & Plan:  Chief Complaint:  Chief Complaint  Patient presents with   Left Hand - Follow-up   Visit Diagnoses:  1. Carpal tunnel syndrome, left upper limb     Plan: Patient is 8 weeks s/p L CTR.  She has some minor issues in the immediate postoperative periord with pillar pain, scar sensitivity, and mild numbness in the thumb.  These issues have almost entirely resolved with time and hand therapy.  She still has mild pillar pain when she pushes up with her palm flat but the scar sensitivity and residual numbness have resolved.  She is able to do all of her home and work tasks without issue.  She can follow up with me again as needed.   Follow-Up Instructions: No follow-ups on file.   Orders:  No orders of the defined types were placed in this encounter.  No orders of the defined types were placed in this encounter.   Imaging: No results found.  PMFS History: Patient Active Problem List   Diagnosis Date Noted   Carpal tunnel syndrome, left upper limb    Bilateral carpal tunnel syndrome 11/08/2021   Screen for colon cancer    Polyp of sigmoid colon    Gastroesophageal reflux disease with esophagitis without hemorrhage    Joint synovitis 07/03/2020   Chest wall pain 07/03/2020   Obstructive sleep apnea treated with BiPAP 10/06/2017   Occipital neuralgia of right side 06/03/2017   Uncontrolled morning headache 05/26/2017   HTN (hypertension), malignant 05/26/2017   Morbid obesity ([place]) 05/26/2017   Snoring 05/26/2017   Occipital headache 04/24/2017   Essential hypertension 04/15/2017   Hypothyroid    Past Medical History:  Diagnosis Date   Essential hypertension 04/15/2017   GERD (gastroesophageal reflux disease)    Headache    Hypothyroid    Hypothyroidism    MRSA infection    OSA  (obstructive sleep apnea)    CPAP   PONV (postoperative nausea and vomiting)    after tonsillectomy    Family History  Problem Relation Age of Onset   Hypertension Mother    Diabetes Mother        TYPE 2    Hyperlipidemia Mother    Thyroid disease Mother    Hypertension Father    Parkinsonism Father    Hyperlipidemia Father    Stroke Father    Hyperlipidemia Sister    Colon cancer Neg Hx    Stomach cancer Neg Hx    Pancreatic cancer Neg Hx     Past Surgical History:  Procedure Laterality Date   BTS     CARPAL TUNNEL RELEASE Left 11/20/2021   Procedure: LEFT CARPAL TUNNEL RELEASE;  Surgeon: Sherilyn Cooter, MD;  Location: [place];  Service: Orthopedics;  Laterality: Left;   CESAREAN SECTION     COLONOSCOPY WITH PROPOFOL N/A 03/15/2021   Procedure: COLONOSCOPY WITH PROPOFOL;  Surgeon: Lucilla Lame, MD;  Location: [place];  Service: Endoscopy;  Laterality: N/A;   ESOPHAGOGASTRODUODENOSCOPY (EGD) WITH PROPOFOL N/A 03/15/2021   Procedure: ESOPHAGOGASTRODUODENOSCOPY (EGD) and dillation WITH PROPOFOL;  Surgeon: Lucilla Lame, MD;  Location: [place];  Service: Endoscopy;  Laterality: N/A;  sleep apnea requests early   TONSILLECTOMY  uterine ablation  2005   Social History   Occupational History   Occupation: Education officer, museum    Employer: [employer]  Tobacco Use   Smoking status: Never   Smokeless tobacco: Never  Vaping Use   Vaping Use: Never used  Substance and Sexual Activity   Alcohol use: Not Currently    Comment: social drinker; very rare    Drug use: No   Sexual activity: Yes    Partners: Male    Birth control/protection: Surgical

## 2022-01-17 NOTE — Therapy (Addendum)
OUTPATIENT OCCUPATIONAL THERAPY TREATMENT & DISCHARGE NOTE   Patient Name: Lindsay Wade MRN: [id number] DOB:04/30/1966, 56 y.o., female Today's Date: 01/17/2022  PCP: Dr. Silas Flood REFERRING PROVIDER: Dr. Sherilyn Cooter   END OF SESSION:   OT End of Session - 01/17/22 1020     Visit Number 2    Number of Visits 10    Date for OT Re-Evaluation 02/14/22    OT Start Time 1020    OT Stop Time 1100    OT Time Calculation (min) 40 min    Activity Tolerance Patient tolerated treatment well;No increased pain;Patient limited by fatigue;Patient limited by pain    Behavior During Therapy [facility] for tasks assessed/performed             Past Medical History:  Diagnosis Date   Essential hypertension 04/15/2017   GERD (gastroesophageal reflux disease)    Headache    Hypothyroid    Hypothyroidism    MRSA infection    OSA (obstructive sleep apnea)    CPAP   PONV (postoperative nausea and vomiting)    after tonsillectomy   Past Surgical History:  Procedure Laterality Date   BTS     CARPAL TUNNEL RELEASE Left 11/20/2021   Procedure: LEFT CARPAL TUNNEL RELEASE;  Surgeon: Sherilyn Cooter, MD;  Location: [place];  Service: Orthopedics;  Laterality: Left;   CESAREAN SECTION     COLONOSCOPY WITH PROPOFOL N/A 03/15/2021   Procedure: COLONOSCOPY WITH PROPOFOL;  Surgeon: Lucilla Lame, MD;  Location: [place];  Service: Endoscopy;  Laterality: N/A;   ESOPHAGOGASTRODUODENOSCOPY (EGD) WITH PROPOFOL N/A 03/15/2021   Procedure: ESOPHAGOGASTRODUODENOSCOPY (EGD) and dillation WITH PROPOFOL;  Surgeon: Lucilla Lame, MD;  Location: [place];  Service: Endoscopy;  Laterality: N/A;  sleep apnea requests early   TONSILLECTOMY     uterine ablation  2005   Patient Active Problem List   Diagnosis Date Noted   Carpal tunnel syndrome, left upper limb    Bilateral carpal tunnel syndrome 11/08/2021   Screen for colon cancer    Polyp of sigmoid colon     Gastroesophageal reflux disease with esophagitis without hemorrhage    Joint synovitis 07/03/2020   Chest wall pain 07/03/2020   Obstructive sleep apnea treated with BiPAP 10/06/2017   Occipital neuralgia of right side 06/03/2017   Uncontrolled morning headache 05/26/2017   HTN (hypertension), malignant 05/26/2017   Morbid obesity ([place]) 05/26/2017   Snoring 05/26/2017   Occipital headache 04/24/2017   Essential hypertension 04/15/2017   Hypothyroid     ONSET DATE: DOS 11/20/21   REFERRING DIAG: G56.02 (ICD-10-CM) - Carpal tunnel syndrome, left upper limb  THERAPY DIAG:  Pain in left wrist  Muscle weakness (generalized)  Other lack of coordination  Stiffness of left hand, not elsewhere classified  Localized edema  Rationale for Evaluation and Treatment Rehabilitation  PERTINENT HISTORY: Per MD: "L CTR on 11/20/21.  Postop aching and weakness in wrist w/ activity.  Sensitivity around incision."   PRECAUTIONS: 8+ weeks post op now    WEIGHT BEARING RESTRICTIONS  WBAT but avoid heavy   SUBJECTIVE:  She states noticing that she was guarding her hand and has stopped this behavior. Still a bit tender when touched/bumped though, but much improved. Still having "tourniquet" sensation in arm at times.   PAIN:  Are you having pain? Yes Rating: 0/10 at rest now, 5/10 when touching around scar area    OBJECTIVE: (All objective assessments below are from initial evaluation on: 01/03/22 unless  otherwise specified.)   HAND DOMINANCE: Right    ADLs: Overall ADLs:  01/17/22: states getting back to cooking and nearly full use of hand now, just a bit numb in palm, sensitive to touch scar still.      FUNCTIONAL OUTCOME MEASURES: Eval: Patient Specific Functional Scale: 2.3 (pick up dog, wearing watch, cleaning)   UPPER EXTREMITY ROM    Eval: she makes loose full fist and has some pain/tightness opposing thumb to SF today, shows hypersensitive symptoms and some stiffness.    Active  ROM Left eval Left 01/17/22  Elbow flexion     Elbow extension     Wrist flexion 54 63  Wrist extension 74 70  Wrist ulnar deviation     Wrist radial deviation     Wrist pronation     Wrist supination     (Blank rows = not tested)       UPPER EXTREMITY MMT:      MMT Left eval  Elbow flexion    Elbow extension    Wrist flexion 4-/5 pain  Wrist extension 4-/5 pain  Wrist ulnar deviation    Wrist radial deviation    Wrist pronation    Wrist supination    (Blank rows = not tested)   HAND FUNCTION: Eval: Grip strength Right: 71.5 lbs, Left: 42 lbs painful   COORDINATION: Eval: 9 Hole Peg Test Right: Left: 27.6 sec (26 sec is WFL, 20sec is avg)    SENSATION: Eval: Light touch intact today, though diminished around sx area  & overtly hypersensitive around palm and scar.    EDEMA:             Eval:  Mildly swollen in palm today and red. And circumferential measure is = to right hand, however.    COGNITION: Overall cognitive status: WFL for evaluation today    OBSERVATIONS:             Eval: red and puffy around left palmar scar area now, flinching and overtly hypersensitive.      TODAY'S TREATMENT:  01/17/22: OT reviews initial HEP and upgrades to hand strength activities and wrist stretch exercises as tolerated (as below) and she does very well with these. Also given doorway ext stretch to help with tourniquet feeling and continues nerve glides to help with remaining changes in sensitivity in palm. She states feeling comfortable managing HEP on her own for a few weeks and will return if needed.   Exercises - Seated Wrist Flexion with Overpressure  - 2-4 x daily - 3-5 reps - 10-15 hold - Wrist Prayer Stretch  - 2-4 x daily - 3-5 reps - 15-20 hold - Doorway Stretches (both arms low, lean in gently)   - 2-4 x daily - 3-5 reps - 15 hold - Seated Median Nerve Glide  - 2-3 x daily - 5 reps - Full Fist  - 1-3 x daily - 5 reps - Seated Claw Fist with Putty  - 2-3 x daily - 5  reps - "Duck Mouth" Strength  - 2-3 x daily - 5 reps - Finger Extension "Pizza!"   - 2-3 x daily - 5 reps - Thumb Opposition with Putty  - 2-3 x daily - 5 reps   Eval: OT educates on most important parts of program today: watching habits that place pressure, pain, or strong force on lt CTR now (self-care); starting systematic, progressive desensitization program as tol 5-6x day for 1-2 mins (neuro re-education); and performing light tolerable funl activities  and avoiding total disuse of hand/arm.  She states understanding these and trials desensitization with lotion and LT and also vibration Wade today and she states feeling much better when done. OT also edu on median nerve glides (neuro re-ed) to be done proximally and distally, lightly, carefully 3-4 x day for 2-3 mins. She does well with these as well. No questions at end of eval/tx.      PATIENT EDUCATION: Education details: See tx section above for details  Person educated: Patient Education method: Verbal Instruction, Teach back, Handouts  Education comprehension: States and demonstrates understanding, Additional Education required      HOME EXERCISE PROGRAM: Access Code: [email or web address] URL: [email or web address] Date: 01/17/2022 Prepared by: Benito Mccreedy   GOALS: Goals reviewed with patient? Yes     SHORT TERM GOALS: (STG required if POC>30 days)   Pt will obtain protective, custom orthotic. Target date: TBD PRN (she has bulky pre-fab braces)  Goal status: D/C N/A   2.  Pt will demo/state understanding of initial HEP to improve pain levels and prerequisite motion. Target date: 01/10/22 Goal status: Met 01/17/22     LONG TERM GOALS:   Pt will improve functional ability by decreased impairment per PSFS assessment from 2.3 to 7 or better, for better quality of life. Target date: 02/14/22 Goal status: INITIAL   2.  Pt will improve grip strength in left hand from painful 42lbs to at least 55lbs for functional use  at home and in IADLs. Target date: 02/14/22 Goal status: INITIAL   3.  Pt will improve A/ROM in left wrist flex from 54* to at least 65*, to have functional motion for tasks like reach and grasp.  Target date: 02/14/22 Goal status: INITIAL   4.  Pt will improve strength in whole left arm from inhibited painful weak 4-/5 MMT to at least 4+/5 MMT with no major pain to have increased functional ability to carry out selfcare and higher-level homecare tasks with no difficulty. Target date: 02/14/22 Goal status: INITIAL   5.  Pt will decrease pain at rest from 5/10 to 1/10 or better to have better sleep and occupational participation in daily roles. Target date: 02/14/22 Goal status: MET 01/17/22 - 0/10 at rest now but still painful if touching      ASSESSMENT:   CLINICAL IMPRESSION: 01/17/22: She is much improved already and now has comprehensive HEP for wrist and hand motion and strength. F/U as needed and if not heard from again in ~1 month, d/c successfully.   Eval: Patient is a 56 y.o. female who was seen today for occupational therapy evaluation for left hand pain, hypersensitivity, weakness from nerve compression and subsequent sx.        PLAN: OT FREQUENCY: 1-2x/week   OT DURATION: 6 weeks   PLANNED INTERVENTIONS: self care/ADL training, therapeutic exercise, therapeutic activity, neuromuscular re-education, manual therapy, scar mobilization, splinting, ultrasound, fluidotherapy, moist heat, cryotherapy, contrast bath, patient/family education, and coping strategies training   RECOMMENDED OTHER SERVICES: none now - possibly PT in future for "neck grinding" symptoms    CONSULTED AND AGREED WITH PLAN OF CARE: Patient   PLAN FOR NEXT SESSION:  Check motion, grip, status as needed in next 3-5 weeks as needed, otherwise she can likely D/C if HEP is working well for her.   Benito Mccreedy, OTR/L, CHT 01/17/2022, 11:15 AM    OCCUPATIONAL THERAPY DISCHARGE SUMMARY  Visits from Start  of Care: 2  The pt did not return to therapy or get in contact  with therapy team, so OT will assume she is self-managing well and will D/C her POC at this point.  Please see notes for details.    Benito Mccreedy, OTR/L, CHT 03/27/22

## 2022-01-20 ENCOUNTER — Encounter: Payer: 59 | Admitting: Physical Therapy

## 2022-01-21 ENCOUNTER — Encounter: Payer: Self-pay | Admitting: Radiology

## 2022-01-21 ENCOUNTER — Encounter: Payer: 59 | Admitting: Orthopedic Surgery

## 2022-01-21 ENCOUNTER — Telehealth: Payer: Self-pay | Admitting: Orthopedic Surgery

## 2022-01-21 NOTE — Telephone Encounter (Signed)
Note faxed as requested.    To:               Recipient at [phone] Subject:          work note Result:           The transmission was successful. Explanation:      All Pages Ok Pages Sent:       2 Connect Time:     0 minutes, 38 seconds Transmit Time:    01/21/2022 10:46 Transfer Rate:    [id number] Status Code:      0000 Retry Count:      0 Job Id:           [id number] Unique Id:        [id number] Fax Line:         23 Fax Server:       [email or web address]

## 2022-01-21 NOTE — Telephone Encounter (Signed)
Patient states her employer is requesting a new return to work note that states is can return to work on 01/08/22 with no restrictions.  The return date & no restrictions must be on note and needs to be signed by provider.     Please fax to [phone] ASAP.

## 2022-01-27 ENCOUNTER — Encounter: Payer: 59 | Admitting: Physical Therapy

## 2022-01-28 ENCOUNTER — Encounter: Payer: Self-pay | Admitting: Family Medicine

## 2022-02-03 ENCOUNTER — Encounter: Payer: 59 | Admitting: Physical Therapy

## 2022-02-10 ENCOUNTER — Other Ambulatory Visit (HOSPITAL_BASED_OUTPATIENT_CLINIC_OR_DEPARTMENT_OTHER): Payer: Self-pay

## 2022-02-12 ENCOUNTER — Encounter (INDEPENDENT_AMBULATORY_CARE_PROVIDER_SITE_OTHER): Payer: Self-pay

## 2022-02-26 ENCOUNTER — Telehealth: Payer: Self-pay

## 2022-02-26 ENCOUNTER — Other Ambulatory Visit: Payer: Self-pay | Admitting: Gastroenterology

## 2022-02-26 ENCOUNTER — Other Ambulatory Visit (HOSPITAL_BASED_OUTPATIENT_CLINIC_OR_DEPARTMENT_OTHER): Payer: Self-pay

## 2022-02-26 MED ORDER — DEXLANSOPRAZOLE 60 MG PO CPDR
60.0000 mg | DELAYED_RELEASE_CAPSULE | Freq: Every day | ORAL | 0 refills | Status: DC
Start: 2022-02-26 — End: 2022-03-25
  Filled 2022-02-26: qty 30, 30d supply, fill #0

## 2022-02-26 MED ORDER — DEXLANSOPRAZOLE 60 MG PO CPDR
60.0000 mg | DELAYED_RELEASE_CAPSULE | Freq: Every day | ORAL | 0 refills | Status: DC
Start: 2022-02-26 — End: 2022-02-26
  Filled 2022-02-26: qty 30, 30d supply, fill #0

## 2022-02-26 NOTE — Telephone Encounter (Signed)
Patient has tried a trial of Pantoprazole... PA has been submitted via [email or web address] to [place].Marland KitchenMarland Kitchen

## 2022-03-04 ENCOUNTER — Other Ambulatory Visit (HOSPITAL_BASED_OUTPATIENT_CLINIC_OR_DEPARTMENT_OTHER): Payer: Self-pay

## 2022-03-05 NOTE — Telephone Encounter (Signed)
Fax received from [employer], the medication has been denied again and it states Dexilant is just not a covered medication

## 2022-03-12 ENCOUNTER — Encounter: Payer: Self-pay | Admitting: Family Medicine

## 2022-03-12 ENCOUNTER — Ambulatory Visit: Payer: 59 | Admitting: Family Medicine

## 2022-03-12 VITALS — BP 121/77 | HR 70 | Temp 98.4°F | Wt 251.2 lb

## 2022-03-12 DIAGNOSIS — J209 Acute bronchitis, unspecified: Secondary | ICD-10-CM

## 2022-03-12 DIAGNOSIS — R051 Acute cough: Secondary | ICD-10-CM

## 2022-03-12 MED ORDER — AMOXICILLIN-POT CLAVULANATE 875-125 MG PO TABS: 1.0000 | ORAL_TABLET | Freq: Two times a day (BID) | ORAL | 0 refills | Status: DC

## 2022-03-12 MED ORDER — BENZONATATE 200 MG PO CAPS: 200.0000 mg | ORAL_CAPSULE | Freq: Three times a day (TID) | ORAL | 0 refills | Status: DC | PRN

## 2022-03-12 NOTE — Patient Instructions (Signed)
Pick up mucinex DM to help with cough and drainage. Tessalon perles for cough- these will not make you sleepy.    Acute Bronchitis, Adult  Acute bronchitis is when air tubes in the lungs (bronchi) suddenly get swollen. The condition can make it hard for you to breathe. In adults, acute bronchitis usually goes away within 2 weeks. A cough caused by bronchitis may last up to 3 weeks. Smoking, allergies, and asthma can make the condition worse. What are the causes? Germs that cause cold and flu (viruses). The most common cause of this condition is the virus that causes the common cold. Bacteria. Substances that bother (irritate) the lungs, including: Smoke from cigarettes and other types of tobacco. Dust and pollen. Fumes from chemicals, gases, or burned fuel. Indoor or outdoor air pollution. What increases the risk? A weak body's defense system. This is also called the immune system. Any condition that affects your lungs and breathing, such as asthma. What are the signs or symptoms? A cough. Coughing up clear, yellow, or green mucus. Making high-pitched whistling sounds when you breathe, most often when you breathe out (wheezing). Runny or stuffy nose. Having too much mucus in your lungs (chest congestion). Shortness of breath. Body aches. A sore throat. How is this treated? Acute bronchitis may go away over time without treatment. Your doctor may tell you to: Drink more fluids. This will help thin your mucus so it is easier to cough up. Use a device that gets medicine into your lungs (inhaler). Use a vaporizer or a humidifier. These are machines that add water to the air. This helps with coughing and poor breathing. Take a medicine that thins mucus and helps clear it from your lungs. Take a medicine that prevents or stops coughing. It is not common to take an antibiotic medicine for this condition. Follow these instructions at home:  Take over-the-counter and prescription  medicines only as told by your doctor. Use an inhaler, vaporizer, or humidifier as told by your doctor. Take two teaspoons (10 mL) of honey at bedtime. This helps lessen your coughing at night. Drink enough fluid to keep your pee (urine) pale yellow. Do not smoke or use any products that contain nicotine or tobacco. If you need help quitting, ask your doctor. Get a lot of rest. Return to your normal activities when your doctor says that it is safe. Keep all follow-up visits. How is this prevented?  Wash your hands often with soap and water for at least 20 seconds. If you cannot use soap and water, use hand sanitizer. Avoid contact with people who have cold symptoms. Try not to touch your mouth, nose, or eyes with your hands. Avoid breathing in smoke or chemical fumes. Make sure to get the flu shot every year. Contact a doctor if: Your symptoms do not get better in 2 weeks. You have trouble coughing up the mucus. Your cough keeps you awake at night. You have a fever. Get help right away if: You cough up blood. You have chest pain. You have very bad shortness of breath. You faint or keep feeling like you are going to faint. You have a very bad headache. Your fever or chills get worse. These symptoms may be an emergency. Get help right away. Call your local emergency services (911 in the [place] Do not wait to see if the symptoms will go away. Do not drive yourself to the hospital. Summary Acute bronchitis is when air tubes in the lungs (bronchi) suddenly get swollen.  In adults, acute bronchitis usually goes away within 2 weeks. Drink more fluids. This will help thin your mucus so it is easier to cough up. Take over-the-counter and prescription medicines only as told by your doctor. Contact a doctor if your symptoms do not improve after 2 weeks of treatment. This information is not intended to replace advice given to you by your health care provider. Make sure you discuss any questions  you have with your health care provider. Document Revised: 10/24/2020 Document Reviewed: 10/24/2020 Elsevier Patient Education  [employer].

## 2022-03-12 NOTE — Progress Notes (Signed)
Lindsay Wade , 1965-09-25, 56 y.o., female MRN: [id number] Patient Care Team    Relationship Specialty Notifications Start End  [place], [place], [place] PCP - General Family Medicine  04/06/17     Chief Complaint  Patient presents with   Cough    Cough for 6 weeks. Did not take COVID     Subjective: Pt presents for an OV with complaints of 6 weeks of cough.  Patient reports cough is dry.  She states it started with an illness about 6 weeks ago that was associated with a fever and fatigue that lasted about 2 days.  Since that time she has had a deep dry cough that will not resolve.  She endorses the cough is worse when laying flat initially.  She will have a coughing fit and then it resolves.  She states she has had coughing fits throughout the day as well.  She denies any current fevers, chills or shortness of breath.  She does not have a history of asthma or COPD.  She does have a history of sleep apnea.  She also has a history of GERD and has been between different medications recently.  She also is prescribed lisinopril, she states this dose has been unchanged for some time.     01/02/2021    8:05 AM 02/22/2019    8:32 AM 04/05/2018   11:55 AM  Depression screen PHQ 2/9  Decreased Interest 0 0 0  Down, Depressed, Hopeless 0 0 0  PHQ - 2 Score 0 0 0  Altered sleeping 0 0   Tired, decreased energy 0 1   Change in appetite 0 1   Feeling bad or failure about yourself  0 0   Trouble concentrating 0 0   Moving slowly or fidgety/restless 0 0   Suicidal thoughts 0 0   PHQ-9 Score 0 2   Difficult doing work/chores Not difficult at all      No Known Allergies Social History   Social History Narrative   Lives at home with her husband and 2 sons   Right handed   3-4 cups of caffeine daily   Past Medical History:  Diagnosis Date   Essential hypertension 04/15/2017   GERD (gastroesophageal reflux disease)    Headache    Hypothyroid    Hypothyroidism    MRSA  infection    OSA (obstructive sleep apnea)    CPAP   PONV (postoperative nausea and vomiting)    after tonsillectomy   Past Surgical History:  Procedure Laterality Date   BTS     CARPAL TUNNEL RELEASE Left 11/20/2021   Procedure: LEFT CARPAL TUNNEL RELEASE;  Surgeon: Sherilyn Cooter, MD;  Location: [facility];  Service: Orthopedics;  Laterality: Left;   CESAREAN SECTION     COLONOSCOPY WITH PROPOFOL N/A 03/15/2021   Procedure: COLONOSCOPY WITH PROPOFOL;  Surgeon: Lucilla Lame, MD;  Location: [facility];  Service: Endoscopy;  Laterality: N/A;   ESOPHAGOGASTRODUODENOSCOPY (EGD) WITH PROPOFOL N/A 03/15/2021   Procedure: ESOPHAGOGASTRODUODENOSCOPY (EGD) and dillation WITH PROPOFOL;  Surgeon: Lucilla Lame, MD;  Location: [facility];  Service: Endoscopy;  Laterality: N/A;  sleep apnea requests early   TONSILLECTOMY     uterine ablation  2005   Family History  Problem Relation Age of Onset   Hypertension Mother    Diabetes Mother        TYPE 2    Hyperlipidemia Mother    Thyroid disease Mother  Hypertension Father    Parkinsonism Father    Hyperlipidemia Father    Stroke Father    Hyperlipidemia Sister    Colon cancer Neg Hx    Stomach cancer Neg Hx    Pancreatic cancer Neg Hx    Allergies as of 03/12/2022   No Known Allergies      Medication List        Accurate as of March 12, 2022 11:59 PM. If you have any questions, ask your nurse or doctor.          amoxicillin-clavulanate 875-125 MG tablet Commonly known as: AUGMENTIN Take 1 tablet by mouth 2 (two) times daily. Started by: Howard Pouch, DO   benzonatate 200 MG capsule Commonly known as: TESSALON Take 1 capsule (200 mg total) by mouth 3 (three) times daily as needed for cough. Started by: Howard Pouch, DO   dexlansoprazole 60 MG capsule Commonly known as: DEXILANT Take 1 capsule (60 mg total) by mouth daily. Schedule office visit for August 2023   furosemide 40 MG  tablet Commonly known as: LASIX Take 1 tablet (40 mg total) by mouth daily. What changed: additional instructions   lisinopril 20 MG tablet Commonly known as: ZESTRIL Take 1 tablet by mouth daily.   pantoprazole 40 MG tablet Commonly known as: PROTONIX Take 1 tablet (40 mg total) by mouth daily.   Synthroid 150 MCG tablet Generic drug: levothyroxine TAKE 1 TABLET BY MOUTH ONCE DAILY BEFORE BREAKFAST        All past medical history, surgical history, allergies, family history, immunizations andmedications were updated in the EMR today and reviewed under the history and medication portions of their EMR.     ROS Negative, with the exception of above mentioned in HPI   Objective:  BP 121/77   Pulse 70   Temp 98.4 F (36.9 C)   Wt 251 lb 3.2 oz (113.9 kg)   SpO2 97%   BMI 40.54 kg/m  Body mass index is 40.54 kg/m. Physical Exam Vitals and nursing note reviewed.  Constitutional:      General: She is not in acute distress.    Appearance: Normal appearance. She is normal weight. She is not ill-appearing or toxic-appearing.     Comments: Very pleasant female.  HENT:     Head: Normocephalic and atraumatic.     Right Ear: Tympanic membrane and ear canal normal.     Left Ear: Tympanic membrane and ear canal normal.     Nose: Nose normal. No congestion or rhinorrhea.     Mouth/Throat:     Mouth: Mucous membranes are moist.     Pharynx: No oropharyngeal exudate or posterior oropharyngeal erythema.  Eyes:     General: No scleral icterus.       Right eye: No discharge.        Left eye: No discharge.     Extraocular Movements: Extraocular movements intact.     Conjunctiva/sclera: Conjunctivae normal.     Pupils: Pupils are equal, round, and reactive to light.  Cardiovascular:     Rate and Rhythm: Normal rate and regular rhythm.  Pulmonary:     Effort: Pulmonary effort is normal. No respiratory distress.     Breath sounds: Normal breath sounds. No wheezing, rhonchi or  rales.     Comments: Cough present on exam. Musculoskeletal:     Cervical back: Neck supple. Tenderness present.  Lymphadenopathy:     Cervical: Cervical adenopathy (Mild left anterior cervical lymphadenopathy) present.  Skin:  Findings: No rash.  Neurological:     Mental Status: She is alert and oriented to person, place, and time. Mental status is at baseline.  Psychiatric:        Mood and Affect: Mood normal.        Behavior: Behavior normal.        Thought Content: Thought content normal.        Judgment: Judgment normal.     No results found. No results found. No results found for this or any previous visit (from the past 24 hour(s)).  Assessment/Plan: Lindsay Wade is a 56 y.o. female present for OV for  Acute bronchitis with symptoms > 10 days/Acute cough Rest, hydrate.  mucinex (DM if cough) OTC encouraged Augmentin twice daily prescribed, take until completed.  Tessalon Perles for cough We discussed GERD as possible cause of cough longevity.  The acute illness with a cough could have caused her reflux to worsen, thus causing dry reflux cough.  If this is the case, then reflux cough will likely remain until GERD is well treated. Cough also could be related to lisinopril use, but less likely, since it does sound like it started with an illness that was associated with a fever. Follow-up 2 weeks with PCP, sooner if needed.  Reviewed expectations re: course of current medical issues. Discussed self-management of symptoms. Outlined signs and symptoms indicating need for more acute intervention. Patient verbalized understanding and all questions were answered. Patient received an After-Visit Summary.    No orders of the defined types were placed in this encounter.  Meds ordered this encounter  Medications   benzonatate (TESSALON) 200 MG capsule    Sig: Take 1 capsule (200 mg total) by mouth 3 (three) times daily as needed for cough.    Dispense:  30 capsule     Refill:  0   amoxicillin-clavulanate (AUGMENTIN) 875-125 MG tablet    Sig: Take 1 tablet by mouth 2 (two) times daily.    Dispense:  20 tablet    Refill:  0   Referral Orders  No referral(s) requested today     Note is dictated utilizing voice recognition software. Although note has been proof read prior to signing, occasional typographical errors still can be missed. If any questions arise, please do not hesitate to call for verification.   electronically signed by:  Howard Pouch, DO  [place]

## 2022-03-17 ENCOUNTER — Telehealth: Payer: Self-pay | Admitting: Gastroenterology

## 2022-03-17 NOTE — Telephone Encounter (Signed)
Patient called to talk about her 2 medications that Dr Allen Norris prescribed for her. She states she called 2 weeks ago and never got a call back. Patient states she is frustrated and is requesting a call back.

## 2022-03-20 NOTE — Telephone Encounter (Signed)
Left message on voicemail.

## 2022-03-24 MED ORDER — PANTOPRAZOLE SODIUM 40 MG PO TBEC: 40.0000 mg | DELAYED_RELEASE_TABLET | Freq: Every day | ORAL | 0 refills | Status: DC

## 2022-03-24 NOTE — Addendum Note (Signed)
Addended by: Lurlean Nanny on: 03/24/2022 11:40 AM   Modules accepted: Orders

## 2022-03-25 NOTE — Addendum Note (Signed)
Addended by: Lurlean Nanny on: 03/25/2022 08:16 AM   Modules accepted: Orders

## 2022-04-02 ENCOUNTER — Telehealth: Payer: Self-pay | Admitting: Family Medicine

## 2022-04-02 ENCOUNTER — Ambulatory Visit: Payer: 59 | Admitting: Family Medicine

## 2022-04-02 NOTE — Telephone Encounter (Signed)
Pt called and stated she is having some type of nerve issue because her left leg has been going numb for the past month and wanted to see pcp. Advised pt she may need to speak with a triage nurse first as this could be serious but she stated she did not see the point and just wanted an appt. Scheduled her 10/3.

## 2022-04-02 NOTE — Progress Notes (Deleted)
Lindsay Wade , [birth year], 56 y.o., female MRN: [id number] Patient Care Team    Relationship Specialty Notifications Start End  [name], [place] PCP - General Family Medicine  04/06/17     No chief complaint on file.    Subjective: Pt presents for an OV with complaints of *** of *** duration.  Associated symptoms include ***.  Pt has tried *** to ease their symptoms.      01/02/2021    8:05 AM 02/22/2019    8:32 AM 04/05/2018   11:55 AM  Depression screen PHQ 2/9  Decreased Interest 0 0 0  Down, Depressed, Hopeless 0 0 0  PHQ - 2 Score 0 0 0  Altered sleeping 0 0   Tired, decreased energy 0 1   Change in appetite 0 1   Feeling bad or failure about yourself  0 0   Trouble concentrating 0 0   Moving slowly or fidgety/restless 0 0   Suicidal thoughts 0 0   PHQ-9 Score 0 2   Difficult doing work/chores Not difficult at all      No Known Allergies Social History   Social History Narrative   Lives at home with her husband and 2 sons   Right handed   3-4 cups of caffeine daily   Past Medical History:  Diagnosis Date   Essential hypertension 04/15/2017   GERD (gastroesophageal reflux disease)    Headache    Hypothyroid    Hypothyroidism    MRSA infection    OSA (obstructive sleep apnea)    CPAP   PONV (postoperative nausea and vomiting)    after tonsillectomy   Past Surgical History:  Procedure Laterality Date   BTS     CARPAL TUNNEL RELEASE Left 11/20/2021   Procedure: LEFT CARPAL TUNNEL RELEASE;  Surgeon: Sherilyn Cooter, MD;  Location: [facility];  Service: Orthopedics;  Laterality: Left;   CESAREAN SECTION     COLONOSCOPY WITH PROPOFOL N/A 03/15/2021   Procedure: COLONOSCOPY WITH PROPOFOL;  Surgeon: Lucilla Lame, MD;  Location: [facility];  Service: Endoscopy;  Laterality: N/A;   ESOPHAGOGASTRODUODENOSCOPY (EGD) WITH PROPOFOL N/A 03/15/2021   Procedure: ESOPHAGOGASTRODUODENOSCOPY (EGD) and dillation WITH PROPOFOL;   Surgeon: Lucilla Lame, MD;  Location: [facility];  Service: Endoscopy;  Laterality: N/A;  sleep apnea requests early   TONSILLECTOMY     uterine ablation  2005   Family History  Problem Relation Age of Onset   Hypertension Mother    Diabetes Mother        TYPE 2    Hyperlipidemia Mother    Thyroid disease Mother    Hypertension Father    Parkinsonism Father    Hyperlipidemia Father    Stroke Father    Hyperlipidemia Sister    Colon cancer Neg Hx    Stomach cancer Neg Hx    Pancreatic cancer Neg Hx    Allergies as of 04/02/2022   No Known Allergies      Medication List        Accurate as of April 02, 2022 12:52 PM. If you have any questions, ask your nurse or doctor.          amoxicillin-clavulanate 875-125 MG tablet Commonly known as: AUGMENTIN Take 1 tablet by mouth 2 (two) times daily.   benzonatate 200 MG capsule Commonly known as: TESSALON Take 1 capsule (200 mg total) by mouth 3 (three) times daily as needed for cough.   furosemide  40 MG tablet Commonly known as: LASIX Take 1 tablet (40 mg total) by mouth daily. What changed: additional instructions   lisinopril 20 MG tablet Commonly known as: ZESTRIL Take 1 tablet by mouth daily.   pantoprazole 40 MG tablet Commonly known as: PROTONIX Take 1 tablet (40 mg total) by mouth daily.   Synthroid 150 MCG tablet Generic drug: levothyroxine TAKE 1 TABLET BY MOUTH ONCE DAILY BEFORE BREAKFAST        All past medical history, surgical history, allergies, family history, immunizations andmedications were updated in the EMR today and reviewed under the history and medication portions of their EMR.     ROS Negative, with the exception of above mentioned in HPI   Objective:  There were no vitals taken for this visit. There is no height or weight on file to calculate BMI.  Physical Exam   No results found. No results found. No results found for this or any previous visit (from the past  24 hour(s)).  Assessment/Plan: Lindsay Wade is a 56 y.o. female present for OV for  *** Reviewed expectations re: course of current medical issues. Discussed self-management of symptoms. Outlined signs and symptoms indicating need for more acute intervention. Patient verbalized understanding and all questions were answered. Patient received an After-Visit Summary.    No orders of the defined types were placed in this encounter.  No orders of the defined types were placed in this encounter.  Referral Orders  No referral(s) requested today     Note is dictated utilizing voice recognition software. Although note has been proof read prior to signing, occasional typographical errors still can be missed. If any questions arise, please do not hesitate to call for verification.   electronically signed by:  Howard Pouch, DO  [place]

## 2022-04-02 NOTE — Telephone Encounter (Signed)
Patient is scheduled for appt on 04/08/2022. She is in training with a new job and having to sit for long periods of time/in a small chair. She cannot come in before the 3rd but did agree that if things worsen in anyway she will call us back.

## 2022-04-08 ENCOUNTER — Encounter: Payer: Self-pay | Admitting: Family Medicine

## 2022-04-08 ENCOUNTER — Ambulatory Visit: Payer: 59 | Admitting: Family Medicine

## 2022-04-08 VITALS — BP 132/80 | HR 74 | Temp 98.4°F | Ht 66.0 in | Wt 249.0 lb

## 2022-04-08 DIAGNOSIS — M5442 Lumbago with sciatica, left side: Secondary | ICD-10-CM | POA: Diagnosis not present

## 2022-04-08 MED ORDER — PREDNISONE 20 MG PO TABS: 40.0000 mg | ORAL_TABLET | Freq: Every day | ORAL | 0 refills | Status: AC

## 2022-04-08 MED ORDER — TIZANIDINE HCL 4 MG PO TABS: 4.0000 mg | ORAL_TABLET | Freq: Four times a day (QID) | ORAL | 0 refills | Status: DC | PRN

## 2022-04-08 NOTE — Patient Instructions (Signed)

## 2022-04-08 NOTE — Progress Notes (Signed)
Musculoskeletal Exam  Patient: Lindsay Wade DOB: 1965-10-30  DOS: 04/08/2022  SUBJECTIVE:  Chief Complaint:   Chief Complaint  Patient presents with   Sciatica    Lindsay Wade is a 56 y.o.  female for evaluation and treatment of back pain.   Onset:  5 weeks ago. Has been sitting in poor quality chairs due to training at work. .  Location: lower L and radiating down back of LLE Character:  aching  Progression of issue:  has worsened Associated symptoms: Radiating down LLE, has a patch on outside of L hip that is numb Denies bowel/bladder incontinence or weakness No redness, bruising, swelling. Treatment: to date has been acetaminophen.   Neurovascular symptoms: no  Past Medical History:  Diagnosis Date   Essential hypertension 04/15/2017   GERD (gastroesophageal reflux disease)    Headache    Hypothyroid    Hypothyroidism    MRSA infection    OSA (obstructive sleep apnea)    CPAP   PONV (postoperative nausea and vomiting)    after tonsillectomy    Objective:  VITAL SIGNS: BP 132/80 (BP Location: Left Arm, Patient Position: Sitting, Cuff Size: Large)   Pulse 74   Temp 98.4 F (36.9 C) (Oral)   Ht '5\' 6"'$  (1.676 m)   Wt 249 lb (112.9 kg)   SpO2 98%   BMI 40.19 kg/m  Constitutional: Well formed, well developed. No acute distress. HENT: Normocephalic, atraumatic.  Thorax & Lungs:  No accessory muscle use Musculoskeletal: low back.   Tenderness to palpation: mild ttp in L lumbar region and upper glute region Deformity: no Ecchymosis: no Straight leg test: negative for Poor hamstring flexibility b/l. Neurologic: Normal sensory function. No focal deficits noted. DTR's equal and symmetric in LE's. No clonus. 5/5 strength in LE's b/l Psychiatric: Normal mood. Age appropriate judgment and insight. Alert & oriented x 3.    Assessment:  Left-sided low back pain with left-sided sciatica, unspecified chronicity - Plan: tiZANidine (ZANAFLEX) 4 MG tablet,  predniSONE (DELTASONE) 20 MG tablet  Plan: Stretches/exercises, heat, ice, Tylenol, 5 d pred burst 40 mg/d, Zanaflex prn. PT if no better.  F/u prn. The patient voiced understanding and agreement to the plan.   [place], DO 04/08/22  4:22 PM

## 2022-04-09 ENCOUNTER — Ambulatory Visit: Payer: 59 | Admitting: Family Medicine

## 2022-04-21 ENCOUNTER — Encounter: Payer: Self-pay | Admitting: Family Medicine

## 2022-04-24 ENCOUNTER — Other Ambulatory Visit: Payer: Self-pay | Admitting: Family Medicine

## 2022-04-24 ENCOUNTER — Encounter: Payer: Self-pay | Admitting: Family Medicine

## 2022-04-24 DIAGNOSIS — H524 Presbyopia: Secondary | ICD-10-CM | POA: Diagnosis not present

## 2022-04-24 MED ORDER — PREDNISONE 20 MG PO TABS: 40.0000 mg | ORAL_TABLET | Freq: Every day | ORAL | 0 refills | Status: DC

## 2022-04-24 MED ORDER — HYDROCODONE BIT-HOMATROP MBR 5-1.5 MG/5ML PO SOLN: 5.0000 mL | Freq: Three times a day (TID) | ORAL | 0 refills | Status: DC | PRN

## 2022-05-30 ENCOUNTER — Encounter: Payer: Self-pay | Admitting: Family Medicine

## 2022-06-02 MED ORDER — LISINOPRIL 20 MG PO TABS: ORAL_TABLET | Freq: Every day | ORAL | 2 refills | Status: DC

## 2022-06-16 ENCOUNTER — Encounter: Payer: Self-pay | Admitting: Family Medicine

## 2022-06-16 ENCOUNTER — Ambulatory Visit: Payer: 59 | Admitting: Family Medicine

## 2022-06-16 VITALS — BP 132/80 | HR 88 | Temp 98.9°F | Resp 17 | Ht 66.0 in | Wt 242.4 lb

## 2022-06-16 DIAGNOSIS — R059 Cough, unspecified: Secondary | ICD-10-CM | POA: Diagnosis not present

## 2022-06-16 DIAGNOSIS — J329 Chronic sinusitis, unspecified: Secondary | ICD-10-CM

## 2022-06-16 DIAGNOSIS — B9689 Other specified bacterial agents as the cause of diseases classified elsewhere: Secondary | ICD-10-CM

## 2022-06-16 LAB — POC COVID19 BINAXNOW: SARS Coronavirus 2 Ag: NEGATIVE

## 2022-06-16 MED ORDER — BENZONATATE 200 MG PO CAPS: 200.0000 mg | ORAL_CAPSULE | Freq: Three times a day (TID) | ORAL | 0 refills | Status: DC | PRN

## 2022-06-16 MED ORDER — GUAIFENESIN-CODEINE 100-10 MG/5ML PO SYRP: 10.0000 mL | ORAL_SOLUTION | Freq: Three times a day (TID) | ORAL | 0 refills | Status: DC | PRN

## 2022-06-16 MED ORDER — AMOXICILLIN 875 MG PO TABS: 875.0000 mg | ORAL_TABLET | Freq: Two times a day (BID) | ORAL | 0 refills | Status: AC

## 2022-06-16 NOTE — Progress Notes (Signed)
   Subjective:    Patient ID: Lindsay Wade, female    DOB: [birth year], 56 y.o.   MRN: [id number]  HPI Cough- pt reports sxs started 6 days ago w/ sore throat.  Then developed cough, congestion, body aches, fever.  + sick contacts.  + sinus pain/pressure.  + HA.  Currently most bothersome sxs are sinus pain and cough.   Review of Systems For ROS see HPI     Objective:   Physical Exam Vitals reviewed.  Constitutional:      General: She is not in acute distress.    Appearance: Normal appearance. She is well-developed. She is obese. She is not ill-appearing.  HENT:     Head: Normocephalic and atraumatic.     Right Ear: Tympanic membrane normal.     Left Ear: Tympanic membrane normal.     Nose: Mucosal edema and congestion present. No rhinorrhea.     Right Sinus: Maxillary sinus tenderness and frontal sinus tenderness present.     Left Sinus: Maxillary sinus tenderness and frontal sinus tenderness present.     Mouth/Throat:     Pharynx: Uvula midline. Posterior oropharyngeal erythema present. No oropharyngeal exudate.  Eyes:     Conjunctiva/sclera: Conjunctivae normal.     Pupils: Pupils are equal, round, and reactive to light.  Cardiovascular:     Rate and Rhythm: Normal rate and regular rhythm.     Heart sounds: Normal heart sounds.  Pulmonary:     Effort: Pulmonary effort is normal. No respiratory distress.     Breath sounds: Normal breath sounds. No wheezing.     Comments: + hacking cough Musculoskeletal:     Cervical back: Normal range of motion and neck supple.  Lymphadenopathy:     Cervical: No cervical adenopathy.  Skin:    General: Skin is warm and dry.  Neurological:     General: No focal deficit present.     Mental Status: She is alert and oriented to person, place, and time.     Cranial Nerves: No cranial nerve deficit.     Motor: No weakness.     Coordination: Coordination normal.  Psychiatric:        Mood and Affect: Mood normal.        Behavior:  Behavior normal.        Thought Content: Thought content normal.           Assessment & Plan:  Bacterial sinusitis- new.  Sxs likely started as viral process and have evolved into a bacterial infxn.  Start Amoxicillin.  Cough meds prn.  Reviewed supportive care and red flags that should prompt return.  Pt expressed understanding and is in agreement w/ plan.

## 2022-06-16 NOTE — Patient Instructions (Signed)
Follow up as needed or as scheduled START the Amoxicillin twice daily- take w/ food USE the Tessalon pearls as needed for cough TAKE the cough syrup at night- may cause drowsiness Drink LOTS of fluids REST! Call with any questions or concerns Hang in there!

## 2022-06-18 MED ORDER — LEVOTHYROXINE SODIUM 150 MCG PO TABS
ORAL_TABLET | Freq: Every day | ORAL | 2 refills | Status: DC
Start: 2022-06-18 — End: 2022-06-19

## 2022-06-18 NOTE — Addendum Note (Signed)
Addended by: Sharon Seller B on: 06/18/2022 12:40 PM   Modules accepted: Orders

## 2022-06-19 ENCOUNTER — Other Ambulatory Visit: Payer: Self-pay | Admitting: Family Medicine

## 2022-06-19 MED ORDER — LEVOTHYROXINE SODIUM 150 MCG PO TABS: 150.0000 ug | ORAL_TABLET | Freq: Every day | ORAL | 3 refills | Status: DC

## 2022-06-27 ENCOUNTER — Other Ambulatory Visit: Payer: Self-pay | Admitting: Gastroenterology

## 2022-07-01 ENCOUNTER — Other Ambulatory Visit: Payer: Self-pay | Admitting: Family Medicine

## 2022-07-16 ENCOUNTER — Encounter: Payer: Self-pay | Admitting: Family Medicine

## 2022-07-16 ENCOUNTER — Other Ambulatory Visit (HOSPITAL_BASED_OUTPATIENT_CLINIC_OR_DEPARTMENT_OTHER): Payer: Self-pay

## 2022-07-16 MED ORDER — LISINOPRIL 20 MG PO TABS
20.0000 mg | ORAL_TABLET | Freq: Every day | ORAL | 3 refills | Status: DC
  Filled 2022-07-16: qty 90, 90d supply, fill #0
  Filled 2022-10-22: qty 90, 90d supply, fill #1
  Filled 2023-02-10: qty 90, 90d supply, fill #2

## 2022-07-16 MED ORDER — LEVOTHYROXINE SODIUM 150 MCG PO TABS
150.0000 ug | ORAL_TABLET | Freq: Every day | ORAL | 3 refills | Status: DC
Start: 2022-07-16 — End: 2022-07-22
  Filled 2022-07-16: qty 90, 90d supply, fill #0

## 2022-07-22 ENCOUNTER — Other Ambulatory Visit (HOSPITAL_BASED_OUTPATIENT_CLINIC_OR_DEPARTMENT_OTHER): Payer: Self-pay

## 2022-07-22 MED ORDER — SYNTHROID 150 MCG PO TABS
150.0000 ug | ORAL_TABLET | Freq: Every day | ORAL | 3 refills | Status: DC
  Filled 2022-07-22 (×2): qty 90, 90d supply, fill #0
  Filled 2022-10-22: qty 90, 90d supply, fill #1

## 2022-07-22 NOTE — Addendum Note (Signed)
Addended by: Sharon Seller B on: 07/22/2022 09:48 AM   Modules accepted: Orders

## 2022-08-19 ENCOUNTER — Telehealth (INDEPENDENT_AMBULATORY_CARE_PROVIDER_SITE_OTHER): Payer: Commercial Managed Care - PPO | Admitting: Family Medicine

## 2022-08-19 ENCOUNTER — Encounter: Payer: Self-pay | Admitting: Family Medicine

## 2022-08-19 DIAGNOSIS — J0140 Acute pansinusitis, unspecified: Secondary | ICD-10-CM

## 2022-08-19 DIAGNOSIS — Z1231 Encounter for screening mammogram for malignant neoplasm of breast: Secondary | ICD-10-CM

## 2022-08-19 MED ORDER — AMOXICILLIN-POT CLAVULANATE 875-125 MG PO TABS: 1.0000 | ORAL_TABLET | Freq: Two times a day (BID) | ORAL | 0 refills | Status: DC

## 2022-08-19 MED ORDER — PREDNISONE 20 MG PO TABS: 40.0000 mg | ORAL_TABLET | Freq: Every day | ORAL | 0 refills | Status: AC

## 2022-08-19 MED ORDER — FLUCONAZOLE 150 MG PO TABS
ORAL_TABLET | ORAL | 0 refills | Status: DC
Start: 2022-08-19 — End: 2022-11-06

## 2022-08-19 NOTE — Progress Notes (Signed)
Chief Complaint  Patient presents with   Sinusitis    Lindsay Wade here for URI complaints. Due to COVID-19 pandemic, we are interacting via web portal for an electronic face-to-face visit. I verified patient's ID using 2 identifiers. Patient agreed to proceed with visit via this method. Patient is at home, I am at office. Patient and I are present for visit.   Duration: 7 days  Associated symptoms: Fever (101 F), sinus congestion, sinus pain, rhinorrhea, ear fullness, myalgia, and dental pain Denies: itchy watery eyes, ear pain, ear drainage, sore throat, wheezing, shortness of breath, and coughing Treatment to date: Robitussin DM Sick contacts: Yes; family members, church members  Past Medical History:  Diagnosis Date   Essential hypertension 04/15/2017   GERD (gastroesophageal reflux disease)    Headache    Hypothyroid    Hypothyroidism    MRSA infection    OSA (obstructive sleep apnea)    CPAP   PONV (postoperative nausea and vomiting)    after tonsillectomy    Objective No conversational dyspnea Age appropriate judgment and insight Nml affect and mood  Acute pansinusitis, recurrence not specified  5 d pred burst 40 mg/d. 7 d of Augmentin given temp and dental pain. Diflucan prn. Continue to push fluids, practice good hand hygiene, cover mouth when coughing. F/u prn. If starting to experience fevers, shaking, or shortness of breath, seek immediate care. Pt voiced understanding and agreement to the plan.  [place], DO 08/19/22 8:40 AM

## 2022-08-20 ENCOUNTER — Ambulatory Visit: Payer: Commercial Managed Care - PPO | Admitting: Family Medicine

## 2022-08-20 ENCOUNTER — Encounter: Payer: Self-pay | Admitting: Family Medicine

## 2022-08-20 VITALS — BP 138/82 | HR 99 | Temp 99.3°F | Ht 66.0 in | Wt 245.0 lb

## 2022-08-20 DIAGNOSIS — U071 COVID-19: Secondary | ICD-10-CM

## 2022-08-20 LAB — POCT INFLUENZA A/B
Influenza A, POC: NEGATIVE
Influenza B, POC: NEGATIVE

## 2022-08-20 LAB — POC COVID19 BINAXNOW: SARS Coronavirus 2 Ag: POSITIVE — AB

## 2022-08-20 NOTE — Patient Instructions (Signed)
Continue to push fluids, practice good hand hygiene, and cover your mouth if you cough.  If you start having fevers, shaking or shortness of breath, seek immediate care.  OK to take Tylenol 1000 mg (2 extra strength tabs) or 975 mg (3 regular strength tabs) every 6 hours as needed.  You are on day 8.  For the first 5 days you should quarantine. Day 6-10 you can return to society with a mask as long as you are fever-free and have improving or resolved symptoms. Day 11 and beyond you can return maskless as long as you are fever-free with improved/resolved symptoms.   Let us know if you need anything.

## 2022-08-20 NOTE — Progress Notes (Signed)
Chief Complaint  Patient presents with   Sinusitis    Cannot smell or taste.      Lindsay Wade here for URI complaints.  Duration: 8 days  Associated symptoms: Fever (101 F), sinus congestion, sinus pain, rhinorrhea, ear fullness, myalgia, and dental pain, loss of taste/smell Denies: itchy watery eyes, ear pain, ear drainage, sore throat, wheezing, shortness of breath, and coughing, N/V/D Treatment to date: Augmentin, Prednisone Sick contacts: Yes- family and church members Has not tested for covid.   Past Medical History:  Diagnosis Date   Essential hypertension 04/15/2017   GERD (gastroesophageal reflux disease)    Headache    Hypothyroid    Hypothyroidism    MRSA infection    OSA (obstructive sleep apnea)    CPAP   PONV (postoperative nausea and vomiting)    after tonsillectomy    Objective BP 138/82 (BP Location: Left Arm, Patient Position: Sitting, Cuff Size: Large)   Pulse 99   Temp 99.3 F (37.4 C) (Oral)   Ht 5' 6"$  (1.676 m)   Wt 245 lb (111.1 kg)   SpO2 95%   BMI 39.54 kg/m  General: Awake, alert, appears stated age HEENT: AT, [place], ears patent b/l and TM's neg, nares patent w/o discharge, pharynx pink and without exudates, MMM Neck: No masses or asymmetry Heart: RRR Lungs: CTAB, no accessory muscle use Psych: Age appropriate judgment and insight, normal mood and affect  COVID-19 - Plan: POC COVID-19, POCT Influenza A/B  Will test for covid and flu today. +covid. Out of window for anti-viral med. Stop Augmentin. Cont prednisone 5 d 40 mg/d. Tylenol prn. CDC quarantining guidelines discussed. Continue to push fluids, practice good hand hygiene, cover mouth when coughing. F/u prn. If starting to experience irreplaceable fluid loss, shaking, or shortness of breath, seek immediate care. Pt voiced understanding and agreement to the plan.  [place], DO 08/20/22 4:43 PM

## 2022-08-21 ENCOUNTER — Telehealth (HOSPITAL_BASED_OUTPATIENT_CLINIC_OR_DEPARTMENT_OTHER): Payer: Self-pay

## 2022-10-06 ENCOUNTER — Encounter: Payer: Self-pay | Admitting: Family Medicine

## 2022-10-07 ENCOUNTER — Encounter: Payer: Self-pay | Admitting: Family Medicine

## 2022-10-07 ENCOUNTER — Telehealth (INDEPENDENT_AMBULATORY_CARE_PROVIDER_SITE_OTHER): Payer: Commercial Managed Care - PPO | Admitting: Family Medicine

## 2022-10-07 DIAGNOSIS — J3489 Other specified disorders of nose and nasal sinuses: Secondary | ICD-10-CM

## 2022-10-07 MED ORDER — PREDNISONE 20 MG PO TABS: 40.0000 mg | ORAL_TABLET | Freq: Every day | ORAL | 0 refills | Status: AC

## 2022-10-07 NOTE — Progress Notes (Signed)
Chief Complaint  Patient presents with   Sinusitis    Lindsay Wade here for URI complaints. We are interacting via web portal for an electronic face-to-face visit. I verified patient's ID using 2 identifiers. Patient agreed to proceed with visit via this method. Patient is at home, I am at office. Patient and I are present for visit.   Duration: 6 days  Associated symptoms: sinus congestion and sinus pressure Denies: sinus pain, itchy watery eyes, ear pain, ear drainage, sore throat, wheezing, shortness of breath, myalgia, and fevers Treatment to date: Sudafed,  Flonase Sick contacts: Yes; spouse  Past Medical History:  Diagnosis Date   Essential hypertension 04/15/2017   GERD (gastroesophageal reflux disease)    Headache    Hypothyroid    Hypothyroidism    MRSA infection    OSA (obstructive sleep apnea)    CPAP   PONV (postoperative nausea and vomiting)    after tonsillectomy    Objective No conversational dyspnea Age appropriate judgment and insight Nml affect and mood  Sinus pressure - Plan: predniSONE (DELTASONE) 20 MG tablet  5 d pred burst 40 mg/d. Continue to push fluids, practice good hand hygiene, cover mouth when coughing. F/u prn. If starting to experience fevers, shaking, or shortness of breath, seek immediate care. Pt voiced understanding and agreement to the plan.  [place], DO 10/07/22 3:07 PM

## 2022-10-20 ENCOUNTER — Encounter: Payer: Self-pay | Admitting: *Deleted

## 2022-10-22 ENCOUNTER — Encounter: Payer: Self-pay | Admitting: Family Medicine

## 2022-10-22 ENCOUNTER — Ambulatory Visit: Payer: Commercial Managed Care - PPO | Admitting: Family Medicine

## 2022-10-22 ENCOUNTER — Other Ambulatory Visit (HOSPITAL_BASED_OUTPATIENT_CLINIC_OR_DEPARTMENT_OTHER): Payer: Self-pay

## 2022-10-22 VITALS — BP 128/85 | HR 76 | Temp 98.8°F | Ht 66.0 in | Wt 258.0 lb

## 2022-10-22 DIAGNOSIS — M545 Low back pain, unspecified: Secondary | ICD-10-CM

## 2022-10-22 DIAGNOSIS — G8929 Other chronic pain: Secondary | ICD-10-CM

## 2022-10-22 MED ORDER — PREDNISONE 20 MG PO TABS
40.0000 mg | ORAL_TABLET | Freq: Every day | ORAL | 0 refills | Status: AC
  Filled 2022-10-22: qty 10, 5d supply, fill #0

## 2022-10-22 NOTE — Progress Notes (Signed)
Musculoskeletal Exam  Patient: Lindsay Wade DOB: 10-04-1965  DOS: 10/22/2022  SUBJECTIVE:  Chief Complaint:   Chief Complaint  Patient presents with   Back Pain    Feet swelling     [name] is a 57 y.o.  female for evaluation and treatment of back pain.   Onset:  several years ago. Initially no inj or change in activity, recently, was cleaning the house and things flared up.  Location: lower R  Character:  burning  Progression of issue:  has worsened, significantly 2 d ago Associated symptoms: swelling in both legs/feet No bruising, redness, bowel/bladder incontinence or weakness, SOB, CP, coughing Treatment: to date has been muscle relaxers and heat.   Neurovascular symptoms: no  Past Medical History:  Diagnosis Date   Essential hypertension 04/15/2017   GERD (gastroesophageal reflux disease)    Headache    Hypothyroid    Hypothyroidism    MRSA infection    OSA (obstructive sleep apnea)    CPAP   PONV (postoperative nausea and vomiting)    after tonsillectomy    Objective:  VITAL SIGNS: BP 128/85 (BP Location: Left Arm, Patient Position: Sitting, Cuff Size: Large)   Pulse 76   Temp 98.8 F (37.1 C) (Oral)   Ht  (1.676 m)   Wt 258 lb (117 kg)   SpO2 97%   BMI 41.64 kg/m  Constitutional: Well formed, well developed. No acute distress. HENT: Normocephalic, atraumatic.  Thorax & Lungs:  No accessory muscle use Musculoskeletal: low back.   Tenderness to palpation: discomfort over R lumbar spine in distribution of dermatome (T11 region).  Deformity: no Ecchymosis: no Straight leg test: negative for Poor hamstring flexibility b/l. Neurologic: Normal sensory function. No focal deficits noted. DTR's equal and symmetric in LE's. No clonus. 5/5 strength in LE b/l. Gait nml.  Psychiatric: Normal mood. Age appropriate judgment and insight. Alert & oriented x 3.    Assessment:  Chronic right-sided low back pain without sciatica - Plan: MR  Lumbar Spine Wo Contrast, predniSONE (DELTASONE) 20 MG tablet  Plan: Chronic, uncontrolled/worsening. Stretches/exercises, heat, ice, Tylenol, 5 d pred burst. Given acute worsening to chronic issue without obvious muscular etiology, will check MRI to r/o neuro claudication/compression.  F/u prn. The patient voiced understanding and agreement to the plan.   Jilda Roche [place], DO 10/22/22  4:29 PM

## 2022-10-22 NOTE — Patient Instructions (Signed)
OK to take Tylenol 1000 mg (2 extra strength tabs) or 975 mg (3 regular strength tabs) every 6 hours as needed.  Ice/cold pack over area for 10-15 min twice daily.  Heat (pad or rice pillow in microwave) over affected area, 10-15 minutes twice daily.   We will be in touch regarding your MRI results.  Let us know if you need anything.

## 2022-10-24 ENCOUNTER — Ambulatory Visit: Payer: Commercial Managed Care - PPO | Admitting: Family Medicine

## 2022-10-29 ENCOUNTER — Encounter: Payer: Self-pay | Admitting: Family Medicine

## 2022-10-29 ENCOUNTER — Other Ambulatory Visit: Payer: Self-pay | Admitting: Family Medicine

## 2022-10-29 DIAGNOSIS — G8929 Other chronic pain: Secondary | ICD-10-CM

## 2022-10-31 ENCOUNTER — Ambulatory Visit (HOSPITAL_COMMUNITY): Payer: Commercial Managed Care - PPO

## 2022-11-03 ENCOUNTER — Other Ambulatory Visit (INDEPENDENT_AMBULATORY_CARE_PROVIDER_SITE_OTHER): Payer: Commercial Managed Care - PPO

## 2022-11-03 ENCOUNTER — Ambulatory Visit: Payer: Commercial Managed Care - PPO | Admitting: Orthopedic Surgery

## 2022-11-03 ENCOUNTER — Encounter: Payer: Self-pay | Admitting: Orthopedic Surgery

## 2022-11-03 VITALS — BP 154/87 | HR 73 | Ht 66.0 in | Wt 258.0 lb

## 2022-11-03 DIAGNOSIS — G8929 Other chronic pain: Secondary | ICD-10-CM

## 2022-11-03 DIAGNOSIS — M545 Low back pain, unspecified: Secondary | ICD-10-CM | POA: Diagnosis not present

## 2022-11-03 DIAGNOSIS — M542 Cervicalgia: Secondary | ICD-10-CM

## 2022-11-03 MED ORDER — METHOCARBAMOL 500 MG PO TABS
500.0000 mg | ORAL_TABLET | Freq: Four times a day (QID) | ORAL | 0 refills | Status: DC
Start: 2022-11-03 — End: 2023-11-20

## 2022-11-03 NOTE — Progress Notes (Signed)
Orthopedic Spine Surgery Office Note  Assessment: Patient is a 57 y.o. female with 2 issues:  1) Acute, severe low back pain without radicular symptoms.  Feels it in the mid lumbar region in the area of the right paraspinal muscles 2) Neck cracking/popping sensation. No significant pain or radicular symptoms   Plan: -Explained that initially conservative treatment is tried as a significant number of patients may experience relief with these treatment modalities. Discussed that the conservative treatments include:  -activity modification  -physical therapy  -over the counter pain medications  -medrol dosepak  -cervical steroid injections -Patient has tried Tylenol, Aleve, prednisone -Prescribed Robaxin for additional pain relief -Patient has tried conservative treatments for over 6 weeks now without any significant relief, so recommended MRI of the lumbar spine -We talked about weight loss to help with her pain.  I also explained that she would need to get down to 230 pounds before any elective spine surgery -Patient should return to office in 4 weeks, x-rays at next visit: None   Patient expressed understanding of the plan and all questions were answered to the patient's satisfaction.   ___________________________________________________________________________   History:  Patient is a 57 y.o. female who presents today for cervical and lumbar spine.  Patient has had several years of low back pain that waxes and wanes in terms of intensity.  Within the last 2 months, she has noticed severe pain in her low back, particularly on the right side.  She describes it as a burning pain.  It is worse with activity.  She says the activity can be as late as going to the store to get groceries.  It does get better if she sits down.  She did get some initial relief with prednisone but as soon as she stopped taking it, the pain returned.  The pain is more severe than prior episodes and has gotten to  the point that it is affecting her everyday life.  She said she can even go to the store to get groceries because it so painful.  She also has trouble sleeping because of the pain.  No pain radiating down either lower extremity.   Patient also has clicking and popping sensation when she rotates her cervical spine.  No radiating pain down either upper extremity.  She sometimes has pain in her neck but is nowhere near as significant as the pain in her lower lumbar.  Weakness: Denies Difficulty with fine motor skills (e.g., buttoning shirts, handwriting): Denies Symptoms of imbalance: Denies Paresthesias and numbness: Denies Bowel or bladder incontinence: Denies Saddle anesthesia: Denies  Treatments tried: Tylenol, Aleve, prednisone  Review of systems: Denies fevers and chills, night sweats, unexplained weight loss, history of cancer, pain that wakes her at night  Past medical history: Hypertension GERD OSA Hypothyroidism  Allergies: NKDA  Past surgical history:  Cesarean section Tonsillectomy Left carpal tunnel release  Social history: Denies use of nicotine product (smoking, vaping, patches, smokeless) Alcohol use: Denies Denies recreational drug use  Physical Exam:  BMI of 41.6  General: no acute distress, appears stated age Neurologic: alert, answering questions appropriately, following commands Respiratory: unlabored breathing on room air, symmetric chest rise Psychiatric: appropriate affect, normal cadence to speech   MSK (spine):  -Strength exam      Left  Right Grip strength               5/5  5/5 Interosseus   5/5   5/5 Wrist extension  5/5  5/5 Wrist flexion  5/5  5/5 Elbow flexion   5/5  5/5 Deltoid    5/5  5/5  EHL    5/5  5/5 TA    5/5  5/5 GSC    5/5  5/5 Knee extension  5/5  5/5 Hip flexion   5/5  5/5  -Sensory exam    Sensation intact to light touch in L3-S1 nerve distributions of bilateral lower extremities  Sensation intact to light  touch in C5-T1 nerve distributions of bilateral upper extremities  -Brachioradialis DTR: 2/4 on the left, 2/4 on the right -Biceps DTR: 2/4 on the left, 2/4 on the right -Achilles DTR: 2/4 on the left, 2/4 on the right -Patellar tendon DTR: 2/4 on the left, 2/4 on the right  -Spurling: Negative bilaterally -Hoffman sign: Negative bilaterally -Clonus: No beats bilaterally -Interosseous wasting: None seen -Grip and release test: Negative -Romberg: Negative -Gait: Normal  Left shoulder exam: No pain through range of motion Right shoulder exam: No pain through range of motion  Tinel's at wrist: Negative bilaterally Durkan's: Negative bilaterally  Tinel's at elbow: Negative bilaterally  Left hip exam: No pain through range of motion, negative Stinchfield, negative FABER Right hip exam: No pain through range of motion, negative Stinchfield, negative FABER  Imaging: XR of the cervical spine from 11/03/2022 was independently reviewed and interpreted, showing small anterior osteophyte formation at C6/7.  No other significant degenerative changes.  No fracture or dislocation seen.  No evidence of instability on flexion/extension views.  XR of the lumbar spine from 11/03/2022 was independently reviewed and interpreted, showing disc height loss with anterior osteophyte formation at L5/S1.  No other significant degenerative changes seen.  No fracture or dislocation seen.  No evidence of instability on flexion/extension views.   Patient name: Lindsay Wade Patient MRN: [id number] Date of visit: 11/03/22    Patient name: Lindsay Wade Patient MRN: [id number] Date of visit: 11/03/22

## 2022-11-06 ENCOUNTER — Ambulatory Visit: Payer: Commercial Managed Care - PPO | Admitting: Gastroenterology

## 2022-11-06 ENCOUNTER — Encounter: Payer: Self-pay | Admitting: Gastroenterology

## 2022-11-06 DIAGNOSIS — M545 Low back pain, unspecified: Secondary | ICD-10-CM

## 2022-11-06 DIAGNOSIS — G8929 Other chronic pain: Secondary | ICD-10-CM | POA: Diagnosis not present

## 2022-11-06 MED ORDER — VOQUEZNA 20 MG PO TABS: 1.0000 | ORAL_TABLET | Freq: Every day | ORAL | 0 refills | Status: DC

## 2022-11-06 NOTE — Progress Notes (Signed)
Primary Care Physician: Sharlene Dory, DO  Primary Gastroenterologist:  Dr. Midge Minium  Chief Complaint  Patient presents with   Follow-up    HPI: Lindsay Wade is a 57 y.o. female here for follow-up of her GERD.  The patient had been on multiple medications and was doing well on Dexilant but the insurance stopped covering the Dexilant.  Because it was very expensive we tried the patient on pantoprazole.  The patient had called for refill of the pantoprazole and was notified that she had not seen me in some time so therefore she is here today for follow-up prior to refilling her medication.  The patient states that the pantoprazole has not been working for her and she has continuous acid breakthrough with epigastric pain so bad that she thought she was going to go to the ER until she took some Mylanta and the pain went away.  The patient denies any dysphagia or unexplained weight loss, fevers, chills, nausea or vomiting.  She reports that she has had frequent heartburn for the last year since she has been unable to get her Dexilant.  Past Medical History:  Diagnosis Date   Essential hypertension 04/15/2017   GERD (gastroesophageal reflux disease)    Headache    Hypothyroid    Hypothyroidism    MRSA infection    OSA (obstructive sleep apnea)    CPAP   PONV (postoperative nausea and vomiting)    after tonsillectomy    Current Outpatient Medications  Medication Sig Dispense Refill   lisinopril (ZESTRIL) 20 MG tablet Take 1 tablet (20 mg total) by mouth daily. 90 tablet 3   methocarbamol (ROBAXIN) 500 MG tablet Take 1 tablet (500 mg total) by mouth 4 (four) times daily. 40 tablet 0   pantoprazole (PROTONIX) 40 MG tablet Take 1 tablet (40 mg total) by mouth daily. SCHEDULE FOLLOW UP VISIT 30 tablet 1   SYNTHROID 150 MCG tablet Take 1 tablet (150 mcg total) by mouth daily before breakfast. 90 tablet 3   tiZANidine (ZANAFLEX) 4 MG tablet Take 1 tablet (4 mg total) by  mouth every 6 (six) hours as needed for muscle spasms. 30 tablet 0   No current facility-administered medications for this visit.    Allergies as of 11/06/2022   (No Known Allergies)    ROS:  General: Negative for anorexia, weight loss, fever, chills, fatigue, weakness. ENT: Negative for hoarseness, difficulty swallowing , nasal congestion. CV: Negative for chest pain, angina, palpitations, dyspnea on exertion, peripheral edema.  Respiratory: Negative for dyspnea at rest, dyspnea on exertion, cough, sputum, wheezing.  GI: See history of present illness. GU:  Negative for dysuria, hematuria, urinary incontinence, urinary frequency, nocturnal urination.  Endo: Negative for unusual weight change.    Physical Examination:   BP 123/81 (BP Location: Left Arm, Patient Position: Sitting, Cuff Size: Large)   Pulse 75   Temp 98.4 F (36.9 C) (Oral)   Ht 5\' 6"  (1.676 m)   Wt 256 lb (116.1 kg)   BMI 41.32 kg/m   General: Well-nourished, well-developed in no acute distress.  Eyes: No icterus. Conjunctivae pink. Neuro: Alert and oriented x 3.  Grossly intact. Skin: Warm and dry, no jaundice.   Psych: Alert and cooperative, normal mood and affect.  Labs:    Imaging Studies: No results found.  Assessment and Plan:   Lindsay Wade is a 57 y.o. y/o female who has tried multiple medications for her reflux with only Dexilant working.  This  has not been covered by her insurance company and she has been on Protonix with significant acid breakthrough.  The patient will be tried on Voqueza.  The patient will try this and if it does not work then the patient will contact my office and we will discuss trying to get the Dexilant from her insurance company covered.  She has also been talked to about antireflux surgery and a possible pH probe to see why she having acid breakthrough on all of these PPIs.  The patient has been explained the plan agrees with it.     Midge Minium, MD. Clementeen Graham     Note: This dictation was prepared with Dragon dictation along with smaller phrase technology. Any transcriptional errors that result from this process are unintentional.

## 2022-11-10 ENCOUNTER — Encounter: Payer: Self-pay | Admitting: Family Medicine

## 2022-11-14 ENCOUNTER — Encounter: Payer: Self-pay | Admitting: Gastroenterology

## 2022-11-17 ENCOUNTER — Other Ambulatory Visit (INDEPENDENT_AMBULATORY_CARE_PROVIDER_SITE_OTHER): Payer: Commercial Managed Care - PPO

## 2022-11-17 ENCOUNTER — Encounter: Payer: Self-pay | Admitting: Internal Medicine

## 2022-11-17 ENCOUNTER — Ambulatory Visit: Payer: Commercial Managed Care - PPO | Admitting: Internal Medicine

## 2022-11-17 ENCOUNTER — Other Ambulatory Visit (HOSPITAL_BASED_OUTPATIENT_CLINIC_OR_DEPARTMENT_OTHER): Payer: Self-pay

## 2022-11-17 VITALS — BP 124/70 | HR 85 | Ht 66.0 in | Wt 260.0 lb

## 2022-11-17 DIAGNOSIS — M542 Cervicalgia: Secondary | ICD-10-CM | POA: Diagnosis not present

## 2022-11-17 DIAGNOSIS — R600 Localized edema: Secondary | ICD-10-CM

## 2022-11-17 DIAGNOSIS — E063 Autoimmune thyroiditis: Secondary | ICD-10-CM | POA: Diagnosis not present

## 2022-11-17 LAB — TSH: TSH: 0.26 u[IU]/mL — ABNORMAL LOW (ref 0.35–5.50)

## 2022-11-17 MED ORDER — SYNTHROID 125 MCG PO TABS
125.0000 ug | ORAL_TABLET | Freq: Every day | ORAL | 3 refills | Status: DC
  Filled 2022-11-17 – 2022-12-02 (×2): qty 90, 90d supply, fill #0
  Filled 2023-04-21: qty 90, 90d supply, fill #1
  Filled 2023-06-03: qty 90, 90d supply, fill #2
  Filled 2023-07-24: qty 90, 90d supply, fill #0

## 2022-11-17 MED ORDER — LEVOTHYROXINE SODIUM 125 MCG PO TABS
125.0000 ug | ORAL_TABLET | Freq: Every day | ORAL | 3 refills | Status: DC
  Filled 2022-11-17: qty 90, 90d supply, fill #0

## 2022-11-17 NOTE — Progress Notes (Signed)
Name: Lindsay Wade  MRN/ DOB: [id number], [birth year]    Age/ Sex: 57 y.o., female    PCP: Sharlene Dory, DO   Reason for Endocrinology Evaluation: Hypothyroidism     Date of Initial Endocrinology Evaluation: 11/27/2020    HPI: Lindsay Wade is a 57 y.o. female with a past medical history of OSA , hypothyroidism and HTN. The patient presented for initial endocrinology clinic visit on 11/27/2020  for consultative assistance with her hypothyroidism.   She has been diagnosed with hypothyroidism ~> 20 years ago secondary to Hashimoto's Thyroiditis.    ON her initial visit to our clinic she was c/o local neck symptoms, ultrasound showed sub-centimeter cystic lesion on the left  12/2020  SUBJECTIVE:    Today (11/17/22):  Lindsay Wade is here for a follow up on hypothyroidism. She has NOT been to our clinic in 2 years.   She had a COVID infection 08/2022 She has been following with GI for GERD.  She continues to endorse an abnormal sensation over the anterior part of her neck , that she feels constantly but mainly when she flexes her neck .  She follows with Ortho for back pain  She continues with lower extremity edema She has been noted with weight loss over the past 2 years , on weight watchers   Denies constipation or diarrhea  Denies palpitations  She is on a BIPAP machine   Synthroid  150 mcg daily  HISTORY:  Past Medical History:  Past Medical History:  Diagnosis Date   Essential hypertension 04/15/2017   GERD (gastroesophageal reflux disease)    Headache    Hypothyroid    Hypothyroidism    MRSA infection    OSA (obstructive sleep apnea)    CPAP   PONV (postoperative nausea and vomiting)    after tonsillectomy   Past Surgical History:  Past Surgical History:  Procedure Laterality Date   BTS     CARPAL TUNNEL RELEASE Left 11/20/2021   Procedure: LEFT CARPAL TUNNEL RELEASE;  Surgeon: Marlyne Beards, MD;  Location: [facility];  Service: Orthopedics;  Laterality: Left;   CESAREAN SECTION     COLONOSCOPY WITH PROPOFOL N/A 03/15/2021   Procedure: COLONOSCOPY WITH PROPOFOL;  Surgeon: Midge Minium, MD;  Location: [facility];  Service: Endoscopy;  Laterality: N/A;   ESOPHAGOGASTRODUODENOSCOPY (EGD) WITH PROPOFOL N/A 03/15/2021   Procedure: ESOPHAGOGASTRODUODENOSCOPY (EGD) and dillation WITH PROPOFOL;  Surgeon: Midge Minium, MD;  Location: [facility];  Service: Endoscopy;  Laterality: N/A;  sleep apnea requests early   TONSILLECTOMY     uterine ablation  2005    Social History:  reports that she has never smoked. She has never used smokeless tobacco. She reports that she does not currently use alcohol. She reports that she does not use drugs. Family History: family history includes Diabetes in her mother; Hyperlipidemia in her father, mother, and sister; Hypertension in her father and mother; Parkinsonism in her father; Stroke in her father; Thyroid disease in her mother.   HOME MEDICATIONS: Allergies as of 11/17/2022   No Known Allergies      Medication List        Accurate as of Nov 17, 2022  3:39 PM. If you have any questions, ask your nurse or doctor.          lisinopril 20 MG tablet Commonly known as: ZESTRIL Take 1 tablet (20 mg total) by mouth daily.   methocarbamol 500 MG tablet  Commonly known as: ROBAXIN Take 1 tablet (500 mg total) by mouth 4 (four) times daily.   pantoprazole 40 MG tablet Commonly known as: PROTONIX Take 1 tablet (40 mg total) by mouth daily. SCHEDULE FOLLOW UP VISIT   Synthroid 150 MCG tablet Generic drug: levothyroxine Take 1 tablet (150 mcg total) by mouth daily before breakfast.   tiZANidine 4 MG tablet Commonly known as: Zanaflex Take 1 tablet (4 mg total) by mouth every 6 (six) hours as needed for muscle spasms.   Voquezna 20 MG Tabs Generic drug: Vonoprazan Fumarate Take 1 tablet by mouth daily.          REVIEW OF  SYSTEMS: A comprehensive ROS was conducted with the patient and is negative except as per HPI    OBJECTIVE:  VS: BP 124/70 (BP Location: Left Arm, Patient Position: Sitting, Cuff Size: Large)   Pulse 85   Ht 5\' 6"  (1.676 m)   Wt 260 lb (117.9 kg)   SpO2 99%   BMI 41.97 kg/m    Wt Readings from Last 3 Encounters:  11/17/22 260 lb (117.9 kg)  11/06/22 256 lb (116.1 kg)  11/03/22 258 lb (117 kg)     EXAM: General: Pt appears well and is in NAD  Eyes: External eye exam normal without stare, lid lag or exophthalmos.  EOM intact.   Neck: General: Supple without adenopathy. Thyroid: Thyroid size normal.  No goiter or nodules appreciated.  Lungs: Clear with good BS bilat   Heart: Auscultation: RRR.  Abdomen: soft, nontender  Extremities:  BL LE: Trace pretibial edema  Mental Status: Judgment, insight: Intact Orientation: Oriented to time, place, and person Mood and affect: No depression, anxiety, or agitation     DATA REVIEWED:  Latest Reference Range & Units 11/17/22 11:56  TSH 0.35 - 5.50 uIU/mL 0.26 (L)  (L): Data is abnormally low  Thyroid Ultrasound 12/24/2020   Estimated total number of nodules >/= 1 cm: 0   Number of spongiform nodules >/=  2 cm not described below (TR1): 0   Number of mixed cystic and solid nodules >/= 1.5 cm not described below (TR2): 0   _________________________________________________________   There are 2 subcentimeter cystic appearing nodules in the left superior thyroid, presumed benign and not warranting additional follow-up.   IMPRESSION: Atrophic and markedly heterogeneous thyroid gland with a few subcentimeter cystic nodules in the left superior thyroid in keeping with history of autoimmune thyroid disease. No dedicated ultrasound follow-up or tissue sampling is recommended for the benign-appearing cysts.  ASSESSMENT/PLAN/RECOMMENDATIONS:   Hashimoto's Thyroiditis :  - She was diagnosed with Hashimoto's Thyroiditis in her  11's.  -TSH continues to be low, will decrease Synthroid as below  Medications : Stop Synthroid 150 mcg daily  Start Synthroid 125 mcg daily  2.  Abnormal neck sensation  -She has had a sensation in her neck since 2015, feels like there is something in the neck -She has had thyroid ultrasound in 2018 which showed no nodules, thyroid ultrasound 2022 showed subcentimeters cyst at the left upper pole of the thyroid -Her 2022 ultrasound showed atrophic thyroid which is consistent with Hashimoto's thyroiditis -Reassurance have been provided from the thyroid standpoint -There is no abnormality on exam today -I will proceed with CT scan of the soft tissue of the neck  3.  Lower extremity edema  -Discussed importance of low-salt diet -I have recommended compression stockings as well as leg elevation  F/U in 1 yr   Signed electronically by: Lamount Cranker  Lonzo Cloud, MD  [facility]  [facility] [address] [place], [place] [address] Phone: [phone] FAX: [phone]   CC: Sharlene Dory, DO [address] [place] [address] Phone: [phone] Fax: [phone]   Return to Endocrinology clinic as below: Future Appointments  Date Time Provider Department Center  12/03/2022  2:15 PM London Sheer, MD [facility] None  02/02/2023  9:00 AM Terri Piedra, DO [facility] None  02/19/2023  9:00 AM Shawnie Dapper, NP [facility] None  11/17/2023 12:10 PM Ndidi Nesby, Konrad Dolores, MD [facility] None

## 2022-11-24 ENCOUNTER — Other Ambulatory Visit (HOSPITAL_COMMUNITY): Payer: Self-pay | Admitting: Orthopedic Surgery

## 2022-11-24 DIAGNOSIS — G8929 Other chronic pain: Secondary | ICD-10-CM

## 2022-11-25 ENCOUNTER — Other Ambulatory Visit: Payer: Self-pay

## 2022-11-25 DIAGNOSIS — K2100 Gastro-esophageal reflux disease with esophagitis, without bleeding: Secondary | ICD-10-CM

## 2022-11-25 NOTE — Telephone Encounter (Signed)
Patient is wanting to make sure that you receive her message that she want to schedule May 30th

## 2022-11-26 ENCOUNTER — Other Ambulatory Visit: Payer: Self-pay

## 2022-11-26 ENCOUNTER — Encounter: Payer: Self-pay | Admitting: Gastroenterology

## 2022-11-26 NOTE — Anesthesia Preprocedure Evaluation (Addendum)
Anesthesia Evaluation  Patient identified by MRN, date of birth, ID band Patient awake    Reviewed: Allergy & Precautions, H&P , NPO status , Patient's Chart, lab work & pertinent test results  History of Anesthesia Complications (+) PONV and history of anesthetic complications  Airway Mallampati: II  TM Distance: >3 FB Neck ROM: Full    Dental no notable dental hx.    Pulmonary sleep apnea and Continuous Positive Airway Pressure Ventilation    Pulmonary exam normal breath sounds clear to auscultation       Cardiovascular hypertension, Normal cardiovascular exam Rhythm:Regular Rate:Normal     Neuro/Psych  Headaches  Neuromuscular disease negative neurological ROS  negative psych ROS   GI/Hepatic negative GI ROS, Neg liver ROS,GERD  ,,Severe GERD   Endo/Other  negative endocrine ROSHypothyroidism    Renal/GU negative Renal ROS  negative genitourinary   Musculoskeletal negative musculoskeletal ROS (+) Arthritis ,    Abdominal   Peds negative pediatric ROS (+)  Hematology negative hematology ROS (+)   Anesthesia Other Findings Hx MRSA infection  Essential hypertension Headache Took BP meds day of procedure  Hypothyroidism OSA (obstructive sleep apnea)  GERD (gastroesophageal reflux disease) PONV (postoperative nausea and vomiting)  Hashimoto's thyroiditis    Reproductive/Obstetrics negative OB ROS                              Anesthesia Physical Anesthesia Plan  ASA: 2  Anesthesia Plan: General   Post-op Pain Management:    Induction: Intravenous  PONV Risk Score and Plan:   Airway Management Planned: Natural Airway and Nasal Cannula  Additional Equipment:   Intra-op Plan:   Post-operative Plan:   Informed Consent: I have reviewed the patients History and Physical, chart, labs and discussed the procedure including the risks, benefits and alternatives for the proposed  anesthesia with the patient or authorized representative who has indicated his/her understanding and acceptance.     Dental Advisory Given  Plan Discussed with: Anesthesiologist, CRNA and Surgeon  Anesthesia Plan Comments: (Patient consented for risks of anesthesia including but not limited to:  - adverse reactions to medications - risk of airway placement if required - damage to eyes, teeth, lips or other oral mucosa - nerve damage due to positioning  - sore throat or hoarseness - Damage to heart, brain, nerves, lungs, other parts of body or loss of life  Patient voiced understanding.)        Anesthesia Quick Evaluation

## 2022-11-28 ENCOUNTER — Other Ambulatory Visit (HOSPITAL_BASED_OUTPATIENT_CLINIC_OR_DEPARTMENT_OTHER): Payer: Self-pay

## 2022-11-29 ENCOUNTER — Ambulatory Visit (HOSPITAL_COMMUNITY)
Admission: RE | Admit: 2022-11-29 | Discharge: 2022-11-29 | Disposition: A | Discharge status: Home / Self Care | Payer: Commercial Managed Care - PPO | Source: Ambulatory Visit | Attending: Orthopedic Surgery | Admitting: Orthopedic Surgery

## 2022-11-29 DIAGNOSIS — G8929 Other chronic pain: Secondary | ICD-10-CM | POA: Insufficient documentation

## 2022-11-29 DIAGNOSIS — M545 Low back pain, unspecified: Secondary | ICD-10-CM | POA: Diagnosis not present

## 2022-12-02 ENCOUNTER — Other Ambulatory Visit (HOSPITAL_BASED_OUTPATIENT_CLINIC_OR_DEPARTMENT_OTHER): Payer: Self-pay

## 2022-12-03 ENCOUNTER — Ambulatory Visit: Payer: Commercial Managed Care - PPO | Admitting: Orthopedic Surgery

## 2022-12-04 ENCOUNTER — Ambulatory Visit: Payer: Commercial Managed Care - PPO | Admitting: Anesthesiology

## 2022-12-04 ENCOUNTER — Encounter: Payer: Self-pay | Admitting: Orthopedic Surgery

## 2022-12-04 ENCOUNTER — Encounter: Payer: Self-pay | Admitting: Gastroenterology

## 2022-12-04 ENCOUNTER — Other Ambulatory Visit: Payer: Self-pay

## 2022-12-04 ENCOUNTER — Ambulatory Visit
Admission: RE | Admit: 2022-12-04 | Discharge: 2022-12-04 | Disposition: A | Discharge status: Home / Self Care | Payer: Commercial Managed Care - PPO | Attending: Gastroenterology | Admitting: Gastroenterology

## 2022-12-04 ENCOUNTER — Encounter
Admission: RE | Disposition: A | Discharge status: Home / Self Care | Payer: Self-pay | Source: Home / Self Care | Attending: Gastroenterology

## 2022-12-04 DIAGNOSIS — I10 Essential (primary) hypertension: Secondary | ICD-10-CM | POA: Insufficient documentation

## 2022-12-04 DIAGNOSIS — G4733 Obstructive sleep apnea (adult) (pediatric): Secondary | ICD-10-CM | POA: Insufficient documentation

## 2022-12-04 DIAGNOSIS — K2100 Gastro-esophageal reflux disease with esophagitis, without bleeding: Secondary | ICD-10-CM | POA: Diagnosis not present

## 2022-12-04 DIAGNOSIS — K2289 Other specified disease of esophagus: Secondary | ICD-10-CM | POA: Diagnosis not present

## 2022-12-04 DIAGNOSIS — E063 Autoimmune thyroiditis: Secondary | ICD-10-CM | POA: Diagnosis not present

## 2022-12-04 DIAGNOSIS — K449 Diaphragmatic hernia without obstruction or gangrene: Secondary | ICD-10-CM | POA: Insufficient documentation

## 2022-12-04 DIAGNOSIS — R12 Heartburn: Secondary | ICD-10-CM | POA: Diagnosis not present

## 2022-12-04 DIAGNOSIS — Z79890 Hormone replacement therapy (postmenopausal): Secondary | ICD-10-CM | POA: Diagnosis not present

## 2022-12-04 DIAGNOSIS — K219 Gastro-esophageal reflux disease without esophagitis: Secondary | ICD-10-CM | POA: Insufficient documentation

## 2022-12-04 DIAGNOSIS — Z79899 Other long term (current) drug therapy: Secondary | ICD-10-CM | POA: Diagnosis not present

## 2022-12-04 DIAGNOSIS — Z8614 Personal history of Methicillin resistant Staphylococcus aureus infection: Secondary | ICD-10-CM | POA: Insufficient documentation

## 2022-12-04 HISTORY — PX: ESOPHAGOGASTRODUODENOSCOPY (EGD) WITH PROPOFOL: SHX5813

## 2022-12-04 HISTORY — DX: Autoimmune thyroiditis: E06.3

## 2022-12-04 SURGERY — ESOPHAGOGASTRODUODENOSCOPY (EGD) WITH PROPOFOL
Anesthesia: General

## 2022-12-04 MED ORDER — PROPOFOL 10 MG/ML IV BOLUS
INTRAVENOUS | Status: DC | PRN
  Administered 2022-12-04: 40 mg via INTRAVENOUS
  Administered 2022-12-04: 90 mg via INTRAVENOUS
  Administered 2022-12-04: 40 mg via INTRAVENOUS

## 2022-12-04 MED ORDER — STERILE WATER FOR IRRIGATION IR SOLN
Status: DC | PRN
  Administered 2022-12-04: 1000 mL

## 2022-12-04 MED ORDER — ONDANSETRON HCL 4 MG/2ML IJ SOLN
4.0000 mg | Freq: Once | INTRAMUSCULAR | Status: AC
  Administered 2022-12-04: 4 mg via INTRAVENOUS

## 2022-12-04 MED ORDER — ONDANSETRON HCL 4 MG/2ML IJ SOLN
INTRAMUSCULAR | Status: DC | PRN
  Administered 2022-12-04: 4 mg via INTRAVENOUS

## 2022-12-04 MED ORDER — SODIUM CHLORIDE 0.9 % IV SOLN: INTRAVENOUS | Status: DC

## 2022-12-04 MED ORDER — LACTATED RINGERS IV SOLN: INTRAVENOUS | Status: DC

## 2022-12-04 MED ORDER — LIDOCAINE HCL (CARDIAC) PF 100 MG/5ML IV SOSY
PREFILLED_SYRINGE | INTRAVENOUS | Status: DC | PRN
  Administered 2022-12-04: 50 mg via INTRAVENOUS

## 2022-12-04 SURGICAL SUPPLY — 32 items

## 2022-12-04 NOTE — Op Note (Signed)
[facility] Gastroenterology Patient Name: Lindsay Wade Procedure Date: 12/04/2022 10:30 AM MRN: [id number] Account #: [id number] Date of Birth: 02/09/1966 Admit Type: Outpatient Age: 57 Room: [facility] OR ROOM 01 Gender: Female Note Status: Finalized Instrument Name: [id number] Procedure:             Upper GI endoscopy Indications:           Heartburn Providers:             Midge Minium MD, MD Referring MD:          Sharlene Dory (Referring MD) Medicines:             Propofol per Anesthesia Complications:         No immediate complications. Procedure:             Pre-Anesthesia Assessment:                        - Prior to the procedure, a History and Physical was                         performed, and patient medications and allergies were                         reviewed. The patient's tolerance of previous                         anesthesia was also reviewed. The risks and benefits                         of the procedure and the sedation options and risks                         were discussed with the patient. All questions were                         answered, and informed consent was obtained. Prior                         Anticoagulants: The patient has taken no anticoagulant                         or antiplatelet agents. ASA Grade Assessment: II - A                         patient with mild systemic disease. After reviewing                         the risks and benefits, the patient was deemed in                         satisfactory condition to undergo the procedure.                        After obtaining informed consent, the endoscope was                         passed under direct vision. Throughout the procedure,  the patient's blood pressure, pulse, and oxygen                         saturations were monitored continuously. The Endoscope                         was introduced through the mouth, and advanced to the                          second part of duodenum. The upper GI endoscopy was                         accomplished without difficulty. The patient tolerated                         the procedure well. Findings:      A small hiatal hernia was present.      The Z-line was irregular and was found at the gastroesophageal junction.      The stomach was normal.      The examined duodenum was normal. Impression:            - Small hiatal hernia.                        - Z-line irregular, at the gastroesophageal junction.                        - Normal stomach.                        - Normal examined duodenum.                        - No specimens collected. Recommendation:        - Discharge patient to home.                        - Resume previous diet.                        - Continue present medications. Procedure Code(s):     --- Professional ---                        [phone], Esophagogastroduodenoscopy, flexible,                         transoral; diagnostic, including collection of                         specimen(s) by brushing or washing, when performed                         (separate procedure) Diagnosis Code(s):     --- Professional ---                        R12, Heartburn                        K22.89, Other specified disease of esophagus CPT copyright 2022 American Medical Association. All rights reserved. The codes documented in this report  are preliminary and upon coder review may  be revised to meet current compliance requirements. Midge Minium MD, MD 12/04/2022 10:41:24 AM This report has been signed electronically. Number of Addenda: 0 Note Initiated On: 12/04/2022 10:30 AM Total Procedure Duration: 0 hours 1 minute 58 seconds  Estimated Blood Loss:  Estimated blood loss: none.      [facility]

## 2022-12-04 NOTE — H&P (Signed)
Midge Minium, MD [facility] [address] [place], [place] [address] [phone] Fax : [phone]  Primary Care Physician:  Sharlene Dory, DO Primary Gastroenterologist:  Dr. Servando Snare  Pre-Procedure History & Physical: HPI:  Lindsay Wade is a 57 y.o. female is here for an endoscopy.   Past Medical History:  Diagnosis Date   Essential hypertension 04/15/2017   GERD (gastroesophageal reflux disease)    Hashimoto's thyroiditis    Headache    not lately   Hypothyroidism    MRSA infection    OSA (obstructive sleep apnea)    CPAP   PONV (postoperative nausea and vomiting)    after tonsillectomy    Past Surgical History:  Procedure Laterality Date   BTS     CARPAL TUNNEL RELEASE Left 11/20/2021   Procedure: LEFT CARPAL TUNNEL RELEASE;  Surgeon: Marlyne Beards, MD;  Location: [facility];  Service: Orthopedics;  Laterality: Left;   CESAREAN SECTION     COLONOSCOPY WITH PROPOFOL N/A 03/15/2021   Procedure: COLONOSCOPY WITH PROPOFOL;  Surgeon: Midge Minium, MD;  Location: [facility];  Service: Endoscopy;  Laterality: N/A;   ESOPHAGOGASTRODUODENOSCOPY (EGD) WITH PROPOFOL N/A 03/15/2021   Procedure: ESOPHAGOGASTRODUODENOSCOPY (EGD) and dillation WITH PROPOFOL;  Surgeon: Midge Minium, MD;  Location: [facility];  Service: Endoscopy;  Laterality: N/A;  sleep apnea requests early   TONSILLECTOMY     uterine ablation  2005    Prior to Admission medications   Medication Sig Start Date End Date Taking? Authorizing Provider  pantoprazole (PROTONIX) 40 MG tablet Take 1 tablet (40 mg total) by mouth daily. SCHEDULE FOLLOW UP VISIT 06/27/22  Yes Midge Minium, MD  SYNTHROID 125 MCG tablet Take 1 tablet (125 mcg total) by mouth daily before breakfast. 11/17/22  Yes Shamleffer, Konrad Dolores, MD  lisinopril (ZESTRIL) 20 MG tablet Take 1 tablet (20 mg total) by mouth daily. Patient taking differently: Take 20 mg by mouth daily. pm 07/16/22  07/16/23  Sharlene Dory, DO  methocarbamol (ROBAXIN) 500 MG tablet Take 1 tablet (500 mg total) by mouth 4 (four) times daily. Patient taking differently: Take 500 mg by mouth as needed. 11/03/22   London Sheer, MD  tiZANidine (ZANAFLEX) 4 MG tablet Take 1 tablet (4 mg total) by mouth every 6 (six) hours as needed for muscle spasms. Patient not taking: Reported on 11/26/2022 04/08/22   Sharlene Dory, DO  Vonoprazan Fumarate (VOQUEZNA) 20 MG TABS Take 1 tablet by mouth daily. Patient not taking: Reported on 11/26/2022 11/06/22   Midge Minium, MD    Allergies as of 11/25/2022   (No Known Allergies)    Family History  Problem Relation Age of Onset   Hypertension Mother    Diabetes Mother        TYPE 2    Hyperlipidemia Mother    Thyroid disease Mother    Hypertension Father    Parkinsonism Father    Hyperlipidemia Father    Stroke Father    Hyperlipidemia Sister    Colon cancer Neg Hx    Stomach cancer Neg Hx    Pancreatic cancer Neg Hx     Social History   Socioeconomic History   Marital status: Married    Spouse name: Jomarie Longs   Number of children: 2   Years of education: 12   Highest education level: High school graduate  Occupational History   Occupation: Marine scientist    Employer: [employer]  Tobacco Use  Smoking status: Never   Smokeless tobacco: Never  Vaping Use   Vaping Use: Never used  Substance and Sexual Activity   Alcohol use: Not Currently    Comment: social drinker; very rare    Drug use: No   Sexual activity: Yes    Partners: Male    Birth control/protection: Surgical  Other Topics Concern   Not on file  Social History Narrative   Lives at home with her husband and 2 sons   Right handed   3-4 cups of caffeine daily   Social Determinants of Health   Financial Resource Strain: Not on file  Food Insecurity: Not on file  Transportation Needs: Not on file  Physical Activity: Not on file  Stress: Not on file   Social Connections: Not on file  Intimate Partner Violence: Not on file    Review of Systems: See HPI, otherwise negative ROS  Physical Exam: BP 130/72   Pulse 83   Temp (!) 97.5 F (36.4 C) (Temporal)   Resp (!) 22   Ht 5\' 6"  (1.676 m)   Wt 117.5 kg   SpO2 98%   BMI 41.80 kg/m  General:   Alert,  pleasant and cooperative in NAD Head:  Normocephalic and atraumatic. Neck:  Supple; no masses or thyromegaly. Lungs:  Clear throughout to auscultation.    Heart:  Regular rate and rhythm. Abdomen:  Soft, nontender and nondistended. Normal bowel sounds, without guarding, and without rebound.   Neurologic:  Alert and  oriented x4;  grossly normal neurologically.  Impression/Plan: Lindsay Wade is here for an endoscopy to be performed for GERD  Risks, benefits, limitations, and alternatives regarding  endoscopy have been reviewed with the patient.  Questions have been answered.  All parties agreeable.   Midge Minium, MD  12/04/2022, 10:10 AM

## 2022-12-04 NOTE — Anesthesia Postprocedure Evaluation (Signed)
Anesthesia Post Note  Patient: Lindsay Wade  Procedure(s) Performed: ESOPHAGOGASTRODUODENOSCOPY (EGD) WITH PROPOFOL  Patient location during evaluation: PACU Anesthesia Type: General Level of consciousness: awake and alert Pain management: pain level controlled Vital Signs Assessment: post-procedure vital signs reviewed and stable Respiratory status: spontaneous breathing, nonlabored ventilation, respiratory function stable and patient connected to nasal cannula oxygen Cardiovascular status: blood pressure returned to baseline and stable Postop Assessment: no apparent nausea or vomiting Anesthetic complications: no   No notable events documented.   Last Vitals:  Vitals:   12/04/22 1046 12/04/22 1054  BP: 108/63 95/60  Pulse: 79 78  Resp: 20 (!) 26  Temp:  (!) 36.4 C  SpO2: 91% 94%    Last Pain:  Vitals:   12/04/22 1054  TempSrc:   PainSc: 0-No pain                 Shawnn Bouillon C Amenah Tucci

## 2022-12-04 NOTE — Transfer of Care (Signed)
Immediate Anesthesia Transfer of Care Note  Patient: Lindsay Wade  Procedure(s) Performed: ESOPHAGOGASTRODUODENOSCOPY (EGD) WITH PROPOFOL  Patient Location: PACU  Anesthesia Type: General  Level of Consciousness: awake, alert  and patient cooperative  Airway and Oxygen Therapy: Patient Spontanous Breathing and Patient connected to supplemental oxygen  Post-op Assessment: Post-op Vital signs reviewed, Patient's Cardiovascular Status Stable, Respiratory Function Stable, Patent Airway and No signs of Nausea or vomiting  Post-op Vital Signs: Reviewed and stable  Complications: No notable events documented.

## 2022-12-05 ENCOUNTER — Encounter: Payer: Self-pay | Admitting: Gastroenterology

## 2022-12-05 ENCOUNTER — Ambulatory Visit (HOSPITAL_BASED_OUTPATIENT_CLINIC_OR_DEPARTMENT_OTHER)
Admission: RE | Admit: 2022-12-05 | Discharge: 2022-12-05 | Disposition: A | Discharge status: Home / Self Care | Payer: Commercial Managed Care - PPO | Source: Ambulatory Visit | Attending: Internal Medicine | Admitting: Internal Medicine

## 2022-12-05 ENCOUNTER — Other Ambulatory Visit (HOSPITAL_BASED_OUTPATIENT_CLINIC_OR_DEPARTMENT_OTHER): Payer: Self-pay

## 2022-12-05 DIAGNOSIS — R221 Localized swelling, mass and lump, neck: Secondary | ICD-10-CM | POA: Diagnosis not present

## 2022-12-05 DIAGNOSIS — E063 Autoimmune thyroiditis: Secondary | ICD-10-CM | POA: Diagnosis not present

## 2022-12-05 MED ORDER — IOHEXOL 300 MG/ML  SOLN
100.0000 mL | Freq: Once | INTRAMUSCULAR | Status: AC | PRN
  Administered 2022-12-05: 80 mL via INTRAVENOUS

## 2022-12-05 MED ORDER — VOQUEZNA 20 MG PO TABS
1.0000 | ORAL_TABLET | Freq: Every day | ORAL | 11 refills | Status: DC
  Filled 2022-12-05: qty 30, 30d supply, fill #0
  Filled 2023-03-18: qty 30, 30d supply, fill #1

## 2022-12-05 NOTE — Addendum Note (Signed)
Addended by: Roena Malady on: 12/05/2022 10:53 AM   Modules accepted: Orders

## 2022-12-08 ENCOUNTER — Other Ambulatory Visit (HOSPITAL_BASED_OUTPATIENT_CLINIC_OR_DEPARTMENT_OTHER): Payer: Self-pay

## 2022-12-09 ENCOUNTER — Other Ambulatory Visit (HOSPITAL_BASED_OUTPATIENT_CLINIC_OR_DEPARTMENT_OTHER): Payer: Self-pay

## 2022-12-10 ENCOUNTER — Other Ambulatory Visit (HOSPITAL_BASED_OUTPATIENT_CLINIC_OR_DEPARTMENT_OTHER): Payer: Self-pay

## 2022-12-11 ENCOUNTER — Other Ambulatory Visit (HOSPITAL_BASED_OUTPATIENT_CLINIC_OR_DEPARTMENT_OTHER): Payer: Self-pay

## 2022-12-12 ENCOUNTER — Ambulatory Visit: Payer: Commercial Managed Care - PPO | Admitting: Family Medicine

## 2022-12-12 ENCOUNTER — Ambulatory Visit: Payer: Commercial Managed Care - PPO | Admitting: Orthopedic Surgery

## 2022-12-12 DIAGNOSIS — M545 Low back pain, unspecified: Secondary | ICD-10-CM | POA: Diagnosis not present

## 2022-12-12 DIAGNOSIS — G8929 Other chronic pain: Secondary | ICD-10-CM

## 2022-12-12 NOTE — Progress Notes (Signed)
Orthopedic Spine Surgery Office Note  Assessment: Patient is a 57 y.o. female with low back pain without radicular symptoms   Plan: -Explained that initially conservative treatment is tried as a significant number of patients may experience relief with these treatment modalities. Discussed that the conservative treatments include:  -activity modification  -physical therapy  -over the counter pain medications  -medrol dosepak  -lumbar steroid injections -Patient has tried Tylenol, Aleve, prednisone, methocarbamol -Recommended PT (referral provided) and lidocaine patches -Discussed L2/3 epidural steroid injection as a next treatment option -We again talked about weight loss to help with her pain.  Would need to get down to 230 pounds before any elective spine surgery  -Patient should return to office on as-needed basis   Patient expressed understanding of the plan and all questions were answered to the patient's satisfaction.   ___________________________________________________________________________  History: Patient is a 57 y.o. female who has been previously seen in the office for acute, severe low back pain without radicular symptoms.  She comes in today with similar symptoms.  She describes a burning sensation in the mid lumbar region.  She notes it is worse if she is particularly active during the day.  She tried Robaxin after our last office visit but did not get much relief with that.  She has been working on weight loss.  No pain radiating to either lower extremity.  Denies paresthesias and numbness.  Previous treatments: Tylenol, Aleve, prednisone, methocarbamol  Physical Exam:  General: no acute distress, appears stated age Neurologic: alert, answering questions appropriately, following commands Respiratory: unlabored breathing on room air, symmetric chest rise Psychiatric: appropriate affect, normal cadence to speech   MSK (spine):  -Strength  exam      Left  Right EHL    5/5  5/5 TA    5/5  5/5 GSC    5/5  5/5 Knee extension  5/5  5/5 Hip flexion   5/5  5/5  -Sensory exam    Sensation intact to light touch in L3-S1 nerve distributions of bilateral lower extremities  -Straight leg raise: Negative bilaterally -Femoral nerve stretch test: Negative bilaterally -Clonus: no beats bilaterally  Imaging: XR of the lumbar spine from 11/03/2022 was previously independently reviewed and interpreted, showing disc height loss with anterior osteophyte formation at L5/S1.  No other significant degenerative changes seen.  No fracture or dislocation seen.  No evidence of instability on flexion/extension views.   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  No other significant stenosis seen.  Facet arthropathy seen at L2/3.   Patient name: Lindsay Wade Patient MRN: [id number] Date of visit: 12/12/22

## 2022-12-15 ENCOUNTER — Encounter: Payer: Self-pay | Admitting: Internal Medicine

## 2022-12-15 ENCOUNTER — Encounter: Payer: Self-pay | Admitting: Gastroenterology

## 2022-12-15 ENCOUNTER — Other Ambulatory Visit (HOSPITAL_BASED_OUTPATIENT_CLINIC_OR_DEPARTMENT_OTHER): Payer: Self-pay

## 2022-12-15 DIAGNOSIS — R9389 Abnormal findings on diagnostic imaging of other specified body structures: Secondary | ICD-10-CM

## 2022-12-16 ENCOUNTER — Other Ambulatory Visit: Payer: Self-pay | Admitting: Internal Medicine

## 2022-12-16 ENCOUNTER — Telehealth: Payer: Self-pay | Admitting: Internal Medicine

## 2022-12-16 ENCOUNTER — Other Ambulatory Visit (HOSPITAL_BASED_OUTPATIENT_CLINIC_OR_DEPARTMENT_OTHER): Payer: Self-pay

## 2022-12-16 NOTE — Telephone Encounter (Signed)
Can you please check with the radiology at [facility] regarding her CAT scan that was done last week?   There is no report and it does not seem like it has been read by a radiologist, when I pull up the order there is only imaging.   Thank you

## 2022-12-17 NOTE — Telephone Encounter (Signed)
Looks like report has been read and result sent to patient

## 2022-12-19 ENCOUNTER — Other Ambulatory Visit (HOSPITAL_BASED_OUTPATIENT_CLINIC_OR_DEPARTMENT_OTHER): Payer: Self-pay

## 2022-12-19 ENCOUNTER — Encounter: Payer: Self-pay | Admitting: Gastroenterology

## 2022-12-19 NOTE — Progress Notes (Unsigned)
Last Mammogram: scheduled for 6/25 Last Pap Smear:  10/24/19- negative Last Colon Screening;  2023 Seat Belts:   yes Sun Screen:   yes Dental Check Up:  yes Brush & Floss:  yes

## 2022-12-22 ENCOUNTER — Encounter: Payer: Self-pay | Admitting: Obstetrics & Gynecology

## 2022-12-22 ENCOUNTER — Ambulatory Visit (INDEPENDENT_AMBULATORY_CARE_PROVIDER_SITE_OTHER): Payer: Commercial Managed Care - PPO | Admitting: Obstetrics & Gynecology

## 2022-12-22 ENCOUNTER — Other Ambulatory Visit (HOSPITAL_COMMUNITY)
Admission: RE | Admit: 2022-12-22 | Discharge: 2022-12-22 | Disposition: A | Discharge status: Home / Self Care | Payer: Commercial Managed Care - PPO | Source: Ambulatory Visit | Attending: Obstetrics & Gynecology | Admitting: Obstetrics & Gynecology

## 2022-12-22 VITALS — BP 137/81 | HR 77 | Resp 16 | Ht 66.0 in | Wt 257.0 lb

## 2022-12-22 DIAGNOSIS — M1991 Primary osteoarthritis, unspecified site: Secondary | ICD-10-CM | POA: Insufficient documentation

## 2022-12-22 DIAGNOSIS — Z6841 Body Mass Index (BMI) 40.0 and over, adult: Secondary | ICD-10-CM | POA: Insufficient documentation

## 2022-12-22 DIAGNOSIS — Z01419 Encounter for gynecological examination (general) (routine) without abnormal findings: Secondary | ICD-10-CM

## 2022-12-22 NOTE — Progress Notes (Signed)
Subjective:     Lindsay Wade is a 57 y.o. female here for a routine exam.  Current complaints: occasional hot flashes and does not want HRT or other meds.     Gynecologic History No LMP recorded. Patient is postmenopausal. Contraception: post menopausal status Last Mammogram: scheduled for 6/25 Last Pap Smear:  10/24/19- negative Last Colon Screening;  2023 Seat Belts:   yes Sun Screen:   yes Dental Check Up:  yes Brush & Floss:  yes   Obstetric History OB History  Gravida Para Term Preterm AB Living  2 2 2     2   SAB IAB Ectopic Multiple Live Births          2    # Outcome Date GA Lbr Len/2nd Weight Sex Delivery Anes PTL Lv  2 Term      CS-LTranv     1 Term      CS-LTranv        The following portions of the patient's history were reviewed and updated as appropriate: allergies, current medications, past family history, past medical history, past social history, past surgical history, and problem list.  Review of Systems Pertinent items noted in HPI and remainder of comprehensive ROS otherwise negative.    Objective:     Vitals:   12/22/22 0813  Resp: 16  Height: 5\' 6"  (1.676 m)   Vitals:  WNL General appearance: alert, cooperative and no distress  HEENT: Normocephalic, without obvious abnormality, atraumatic Eyes: negative Throat: lips, mucosa, and tongue normal; teeth and gums normal  Respiratory: Clear to auscultation bilaterally  CV: Regular rate and rhythm  Breasts:  Normal appearance, no masses or tenderness, no nipple retraction or dimpling  GI: Soft, non-tender; bowel sounds normal; no masses,  no organomegaly  GU: External Genitalia:  Tanner V, no lesion Urethra:  No prolapse   Vagina: Pink, normal rugae, no blood or discharge, atrohpic; bleeding with pap  Cervix: No CMT, no lesion; os stenotic/atrophic  Uterus:  Normal size and contour, non tender  Adnexa: Normal, no masses, non tender  Musculoskeletal: No edema, redness or tenderness in the  calves or thighs  Skin: No lesions or rash  Lymphatic: Axillary adenopathy: none     Psychiatric: Normal mood and behavior        Assessment:    Healthy female exam.    Plan:   Pap with cotesting--os stenotic and atrophic and does not emit cytobrush Annual mammogram--scheduled 6/24 Colon cancer screening--up to date; rpt in 7 years.  Replens prn; discussed vaginal estrogen if ever needed.

## 2022-12-23 ENCOUNTER — Other Ambulatory Visit (HOSPITAL_BASED_OUTPATIENT_CLINIC_OR_DEPARTMENT_OTHER): Payer: Self-pay

## 2022-12-23 LAB — CYTOLOGY - PAP
Comment: NEGATIVE
Diagnosis: NEGATIVE
High risk HPV: NEGATIVE

## 2022-12-24 ENCOUNTER — Other Ambulatory Visit (HOSPITAL_BASED_OUTPATIENT_CLINIC_OR_DEPARTMENT_OTHER): Payer: Self-pay

## 2022-12-25 NOTE — Telephone Encounter (Signed)
Hello, Can you help with updating this referral to the provider the patient is requesting or do we have to place a new referral ?

## 2022-12-29 NOTE — Telephone Encounter (Signed)
Please update referral to reflect [facility]

## 2022-12-29 NOTE — Addendum Note (Signed)
Addended by: Scarlette Shorts on: 12/29/2022 09:55 AM   Modules accepted: Orders

## 2022-12-30 ENCOUNTER — Encounter (HOSPITAL_BASED_OUTPATIENT_CLINIC_OR_DEPARTMENT_OTHER): Payer: Self-pay

## 2022-12-30 ENCOUNTER — Ambulatory Visit (HOSPITAL_BASED_OUTPATIENT_CLINIC_OR_DEPARTMENT_OTHER)
Admission: RE | Admit: 2022-12-30 | Discharge: 2022-12-30 | Disposition: A | Discharge status: Home / Self Care | Payer: Commercial Managed Care - PPO | Source: Ambulatory Visit | Attending: Family Medicine | Admitting: Family Medicine

## 2022-12-30 ENCOUNTER — Encounter: Payer: Self-pay | Admitting: Obstetrics & Gynecology

## 2022-12-30 DIAGNOSIS — Z1231 Encounter for screening mammogram for malignant neoplasm of breast: Secondary | ICD-10-CM | POA: Diagnosis not present

## 2023-01-01 ENCOUNTER — Other Ambulatory Visit: Payer: Self-pay

## 2023-01-01 DIAGNOSIS — K2100 Gastro-esophageal reflux disease with esophagitis, without bleeding: Secondary | ICD-10-CM

## 2023-01-05 ENCOUNTER — Other Ambulatory Visit (HOSPITAL_BASED_OUTPATIENT_CLINIC_OR_DEPARTMENT_OTHER): Payer: Self-pay

## 2023-01-12 ENCOUNTER — Encounter: Payer: Commercial Managed Care - PPO | Admitting: Family Medicine

## 2023-01-14 ENCOUNTER — Ambulatory Visit: Payer: Commercial Managed Care - PPO | Admitting: Surgery

## 2023-01-15 ENCOUNTER — Other Ambulatory Visit (HOSPITAL_BASED_OUTPATIENT_CLINIC_OR_DEPARTMENT_OTHER): Payer: Self-pay

## 2023-01-15 NOTE — Therapy (Signed)
OUTPATIENT PHYSICAL THERAPY THORACOLUMBAR EVALUATION   Patient Name: Lindsay Wade MRN: [id number] DOB:[birth year], 57 y.o., female Today's Date: 01/16/2023  END OF SESSION:  PT End of Session - 01/16/23 0953     Visit Number 1    Number of Visits 12    Date for PT Re-Evaluation 03/06/23    Authorization Type [employer]    PT Start Time 0935    PT Stop Time 1015    PT Time Calculation (min) 40 min    Activity Tolerance Patient tolerated treatment well    Behavior During Therapy [facility] for tasks assessed/performed             Past Medical History:  Diagnosis Date   Essential hypertension 04/15/2017   GERD (gastroesophageal reflux disease)    Hashimoto's thyroiditis    Headache    not lately   Hypothyroidism    MRSA infection    OSA (obstructive sleep apnea)    CPAP   PONV (postoperative nausea and vomiting)    after tonsillectomy   Past Surgical History:  Procedure Laterality Date   BTS     CARPAL TUNNEL RELEASE Left 11/20/2021   Procedure: LEFT CARPAL TUNNEL RELEASE;  Surgeon: Marlyne Beards, MD;  Location: [facility];  Service: Orthopedics;  Laterality: Left;   CESAREAN SECTION     COLONOSCOPY WITH PROPOFOL N/A 03/15/2021   Procedure: COLONOSCOPY WITH PROPOFOL;  Surgeon: Midge Minium, MD;  Location: [facility];  Service: Endoscopy;  Laterality: N/A;   ESOPHAGOGASTRODUODENOSCOPY (EGD) WITH PROPOFOL N/A 03/15/2021   Procedure: ESOPHAGOGASTRODUODENOSCOPY (EGD) and dillation WITH PROPOFOL;  Surgeon: Midge Minium, MD;  Location: [facility];  Service: Endoscopy;  Laterality: N/A;  sleep apnea requests early   ESOPHAGOGASTRODUODENOSCOPY (EGD) WITH PROPOFOL N/A 12/04/2022   Procedure: ESOPHAGOGASTRODUODENOSCOPY (EGD) WITH PROPOFOL;  Surgeon: Midge Minium, MD;  Location: [facility];  Service: Endoscopy;  Laterality: N/A;   TONSILLECTOMY     uterine ablation  2005   Patient Active Problem List   Diagnosis Date Noted   Primary  osteoarthritis 12/22/2022   Body mass index (BMI) 40.0-44.9, adult (HCC) 12/22/2022   Carpal tunnel syndrome, left upper limb    Screen for colon cancer    Polyp of sigmoid colon    Gastroesophageal reflux disease with esophagitis without hemorrhage    Joint synovitis 07/03/2020   Obstructive sleep apnea treated with BiPAP 10/06/2017   Occipital neuralgia of right side 06/03/2017   Uncontrolled morning headache 05/26/2017   HTN (hypertension), malignant 05/26/2017   Snoring 05/26/2017   Occipital headache 04/24/2017   Essential hypertension 04/15/2017   Hypothyroid     PCP: Sharlene Dory, DO   REFERRING PROVIDER: London Sheer, MD  REFERRING DIAG: M54.50,G89.29 (ICD-10-CM) - Chronic midline low back pain without sciatica   Rationale for Evaluation and Treatment: Rehabilitation  THERAPY DIAG:  Other low back pain  Muscle weakness (generalized)  ONSET DATE: 23 years ago  SUBJECTIVE:  SUBJECTIVE STATEMENT: It's been going on forever, its burning, but I'm here reluctantly, I'm not one for PT, but my brother in law swore by it.  I've been in such pain I felt like something sinister is going on, I keep going on, but I've stopped and cried, but I went to the doctor and they found nothing wrong.  Yesterday started getting pain down my left left, and now all the way down to my toe, that was just a one day thing, today its fine.  This has been going on for 23 years, I do think my weight does impact my back too.  I have GERD too which is horrible.  I know musculoskeletal pain, this is nerve pain.  It burns when I stand, I sit down and it gets better.    PERTINENT HISTORY:  HTN, Hashimoto's thyroiditis/hypothyroidism, MRSA, GERD, h/o C-section x 2, osteoarthritis  PAIN:  Are you having pain?  Yes: NPRS scale: 5-10/10 Pain location: right side low back  Pain description: constant, like a rock in your shoe, burning at worst Aggravating factors: prolonged sitting, moving, lifting, bending, standing, walking Relieving factors: heating pack, muscle relaxers, bengay  PRECAUTIONS: None  RED FLAGS: None   WEIGHT BEARING RESTRICTIONS: No  FALLS:  Has patient fallen in last 6 months? No  LIVING ENVIRONMENT: Lives with: lives with their family Lives in: House/apartment Stairs: Yes: Internal: 14 steps; on left going up Has following equipment at home: None  OCCUPATION: Energy manager, works from home  PLOF: Independent  PATIENT GOALS: decrease pain  NEXT MD VISIT: 01/20/23 with Dr. Carmelia Roller, follow-up if needed  OBJECTIVE:   DIAGNOSTIC FINDINGS:  11/28/2022 MR lumbar spine Disc levels:   T12-L1: No significant disc protrusion, foraminal stenosis, or canal stenosis.  L1-L2: No significant disc protrusion, foraminal stenosis, or canal stenosis.  L2-L3: Disc bulging, ligamentum flavum thickening facet arthropathy. Mild canal stenosis. Mild bilateral foraminal stenosis. L3-L4: Mild disc bulging and facet arthropathy without significant canal stenosis. Mild bilateral foraminal stenosis.  L4-L5: Mild disc bulging. Facet arthropathy. No significant canal or foraminal stenosis.  L5-S1: Left eccentric disc bulging with mild left foraminal stenosis. No significant canal or right foraminal stenosis.   IMPRESSION: Mild foraminal stenosis bilaterally at L2-L3 and L3-L4 and on the left at L5-S1. Mild canal stenosis at L2-L3.  PATIENT SURVEYS:  Modified Oswestry 16/50*   *describes pain much worse than reflected on oswestry  COGNITION: Overall cognitive status: Within functional limits for tasks assessed     SENSATION: WFL  MUSCLE LENGTH: Hamstrings: Right 90 deg; Left 90 deg Quads: moderate tightness bilaterally.   POSTURE: No Significant postural  limitations  PALPATION: Tenderness R lumbar lumbar paraspinals, R QL, R SIJ, R glut med, R piriformis.   LUMBAR ROM:  full AROM, discomfort R side all motions, R SB and R rotation worst.   LOWER EXTREMITY ROM:   WNL, noted more hip IR than ER, history of W-sitting in youth  LOWER EXTREMITY MMT:   5/5 LE strength bil, can wall on heels and toes.   Decreased core strength.   LUMBAR SPECIAL TESTS:  Straight leg raise test: Negative and Long sit test: Positive  GAIT: Distance walked: 37' Comments: no device or deviation.   TODAY'S TREATMENT:  DATE:   01/16/23 Eval, patient education    PATIENT EDUCATION:  Education details: findings, POC Person educated: Patient Education method: Explanation Education comprehension: verbalized understanding  HOME EXERCISE PROGRAM: TBD  ASSESSMENT:  CLINICAL IMPRESSION: Lindsay Wade  is a 57 y.o. female who was seen today for physical therapy evaluation and treatment for chronic low back pain.  Lindsay Wade reports normal 23-year history of right-sided low back pain.  MRI findings were not significant.  On examination Lindsay Wade demonstrates good active lumbar range of motion, no strength deficits in her legs, good hamstring flexibility, although significant quad tightness bilaterally, and good hip mobility.  She was extremely tender over her right SI joint and surrounding musculature.  She did not demonstrate tenderness in her lumbar spine with PA mobs.  She also had a positive supine to long sit test.  Suspect that her pain is more due to SI joint dysfunction than lumbar stenosis.  Lindsay Wade would benefit from skilled physical therapy for core strengthening, lumbar/SIJ stabilization in order to decrease pain and improve activity tolerance.  We discussed today that she is a very good for physical therapy, and trigger point dry  needling as well.  OBJECTIVE IMPAIRMENTS: decreased activity tolerance, decreased strength, increased fascial restrictions, increased muscle spasms, and pain.   ACTIVITY LIMITATIONS: carrying, lifting, bending, standing, squatting, sleeping, and locomotion level  PARTICIPATION LIMITATIONS: meal prep, cleaning, laundry, and community activity  PERSONAL FACTORS: Time since onset of injury/illness/exacerbation and 1-2 comorbidities: HTN, Hashimoto's thyroiditis/hypothyroidism, MRSA, GERD, h/o C-section x 2, osteoarthritis  are also affecting patient's functional outcome.   REHAB POTENTIAL: Good  CLINICAL DECISION MAKING: Evolving/moderate complexity  EVALUATION COMPLEXITY: Moderate   GOALS: Goals reviewed with patient? Yes  SHORT TERM GOALS: Target date: 01/30/2023   Patient will be independent with initial HEP.  Baseline:  Goal status: INITIAL  2.  Patient will complete FOTO Baseline: not done Goal status: INITIAL   LONG TERM GOALS: Target date: 02/27/2023    Patient will be independent with advanced/ongoing HEP to improve outcomes and carryover.  Baseline:  Goal status: INITIAL  2.  Patient will report 75% improvement in R sided low back pain/SIJ to improve QOL.  Baseline:  Goal status: INITIAL  3.   Patient will report predicted outcome on lumbar FOTO to demonstrate improved functional ability.  Baseline: TBD Goal status: INITIAL   4.  Patient will tolerate >30 min of standing and walking to perform household ADLS like cooking and cleaning without increased LBP pain. Baseline: burning pain after 15 min Goal status: INITIAL  5.  Patient will report 6 points improvement on Modified [place]. Baseline: 16/50 Goal status: INITIAL  PLAN:  PT FREQUENCY: 1-2x/week  PT DURATION: 6 weeks  PLANNED INTERVENTIONS: Therapeutic exercises, Therapeutic activity, Neuromuscular re-education, Balance training, Gait training, Patient/Family education, Self Care, Joint  mobilization, Joint manipulation, Stair training, Orthotic/Fit training, Dry Needling, Electrical stimulation, Spinal manipulation, Spinal mobilization, Cryotherapy, Moist heat, Taping, Traction, Ultrasound, Manual therapy, and Re-evaluation.  PLAN FOR NEXT SESSION: FOTO, check supine to long sit test and correct, check preference for SIJ exercises, start with NS exercises for core strengtheing, manual therapy and modalities PRN, interested in [address], PT 01/16/2023, 4:43 PM

## 2023-01-16 ENCOUNTER — Encounter: Payer: Self-pay | Admitting: Physical Therapy

## 2023-01-16 ENCOUNTER — Telehealth: Payer: Self-pay

## 2023-01-16 ENCOUNTER — Ambulatory Visit: Payer: Commercial Managed Care - PPO | Attending: Orthopedic Surgery | Admitting: Physical Therapy

## 2023-01-16 ENCOUNTER — Other Ambulatory Visit (HOSPITAL_BASED_OUTPATIENT_CLINIC_OR_DEPARTMENT_OTHER): Payer: Self-pay

## 2023-01-16 DIAGNOSIS — M5459 Other low back pain: Secondary | ICD-10-CM | POA: Diagnosis not present

## 2023-01-16 DIAGNOSIS — M6281 Muscle weakness (generalized): Secondary | ICD-10-CM | POA: Insufficient documentation

## 2023-01-16 DIAGNOSIS — M545 Low back pain, unspecified: Secondary | ICD-10-CM | POA: Insufficient documentation

## 2023-01-16 DIAGNOSIS — G8929 Other chronic pain: Secondary | ICD-10-CM | POA: Insufficient documentation

## 2023-01-16 NOTE — Telephone Encounter (Signed)
Approved through 03/13/2023, will have to submit another PA if pt continues

## 2023-01-19 ENCOUNTER — Ambulatory Visit: Payer: Commercial Managed Care - PPO | Admitting: Physical Therapy

## 2023-01-19 ENCOUNTER — Encounter: Payer: Self-pay | Admitting: Physical Therapy

## 2023-01-19 ENCOUNTER — Other Ambulatory Visit (HOSPITAL_BASED_OUTPATIENT_CLINIC_OR_DEPARTMENT_OTHER): Payer: Self-pay

## 2023-01-19 DIAGNOSIS — M5459 Other low back pain: Secondary | ICD-10-CM

## 2023-01-19 DIAGNOSIS — M6281 Muscle weakness (generalized): Secondary | ICD-10-CM

## 2023-01-19 NOTE — Therapy (Signed)
Lindsay Wade arrived today doing OK, seemed a bit surprised that she was not working with evaluating therapist. Offered treatment, pt politely declined stating that she thought she was going to be working with evaluating PT. I then attempted to see if evaluating therapist was available to switch patients, unfortunately she was not available at this time to switch.   Lindsay Wade then relayed she was disappointed and surprised that she was not seeing the same PT who evaluated her, front desk staff assisted in educating in scheduling system used and that most patients are shared between PTs/PTAs.   Apologized to patient and directed front desk to change her next appointment with clinic PTA to evaluating PT, also had front desk double check that pt is only seeing evaluating therapist for all appointments moving forward.   No charge for today's visit.  Nedra Hai, PT, DPT 01/19/23 1:36 PM

## 2023-01-20 ENCOUNTER — Other Ambulatory Visit (HOSPITAL_BASED_OUTPATIENT_CLINIC_OR_DEPARTMENT_OTHER): Payer: Self-pay

## 2023-01-20 ENCOUNTER — Encounter: Payer: Self-pay | Admitting: Family Medicine

## 2023-01-20 ENCOUNTER — Ambulatory Visit: Payer: Commercial Managed Care - PPO | Admitting: Family Medicine

## 2023-01-20 VITALS — BP 120/80 | HR 62 | Temp 98.7°F | Ht 66.0 in | Wt 264.0 lb

## 2023-01-20 DIAGNOSIS — Z0000 Encounter for general adult medical examination without abnormal findings: Secondary | ICD-10-CM

## 2023-01-20 DIAGNOSIS — M7581 Other shoulder lesions, right shoulder: Secondary | ICD-10-CM

## 2023-01-20 DIAGNOSIS — M79672 Pain in left foot: Secondary | ICD-10-CM | POA: Diagnosis not present

## 2023-01-20 LAB — COMPREHENSIVE METABOLIC PANEL
ALT: 25 U/L (ref 0–35)
AST: 19 U/L (ref 0–37)
Albumin: 4.5 g/dL (ref 3.5–5.2)
Alkaline Phosphatase: 61 U/L (ref 39–117)
BUN: 17 mg/dL (ref 6–23)
CO2: 26 mEq/L (ref 19–32)
Calcium: 9.6 mg/dL (ref 8.4–10.5)
Chloride: 105 mEq/L (ref 96–112)
Creatinine, Ser: 0.78 mg/dL (ref 0.40–1.20)
GFR: 85.94 mL/min (ref >60.00)
Glucose, Bld: 101 mg/dL — ABNORMAL HIGH (ref 70–99)
Potassium: 4.3 mEq/L (ref 3.5–5.1)
Sodium: 139 mEq/L (ref 135–145)
Total Bilirubin: 0.6 mg/dL (ref 0.2–1.2)
Total Protein: 7 g/dL (ref 6.0–8.3)

## 2023-01-20 LAB — LIPID PANEL
Cholesterol: 255 mg/dL — ABNORMAL HIGH (ref 0–200)
HDL: 59.5 mg/dL (ref >39.00)
NonHDL: 195.28
Total CHOL/HDL Ratio: 4
Triglycerides: 228 mg/dL — ABNORMAL HIGH (ref 0.0–149.0)
VLDL: 45.6 mg/dL — ABNORMAL HIGH (ref 0.0–40.0)

## 2023-01-20 LAB — CBC
HCT: 40.7 % (ref 36.0–46.0)
Hemoglobin: 13.4 g/dL (ref 12.0–15.0)
MCHC: 33 g/dL (ref 30.0–36.0)
MCV: 88.8 fl (ref 78.0–100.0)
Platelets: 300 10*3/uL (ref 150.0–400.0)
RBC: 4.58 Mil/uL (ref 3.87–5.11)
RDW: 13.7 % (ref 11.5–15.5)
WBC: 5.6 10*3/uL (ref 4.0–10.5)

## 2023-01-20 LAB — LDL CHOLESTEROL, DIRECT: Direct LDL: 154 mg/dL

## 2023-01-20 LAB — URIC ACID: Uric Acid, Serum: 3.4 mg/dL (ref 2.4–7.0)

## 2023-01-20 NOTE — Patient Instructions (Addendum)
Give [place] 2-3 business days to get the results of your labs back.   Keep the diet clean and stay active.  Please get me a copy of your advanced directive form at your convenience.   For the swelling in your lower extremities, be sure to elevate your legs when able, mind the salt intake, stay physically active and consider wearing compression stockings.  The Shingrix vaccine (for shingles) is a 2 shot series spaced 2-6 months apart. It can make people feel low energy, achy and almost like they have the flu for 48 hours after injection. 1/5 people can have nausea and/or vomiting. Please plan accordingly when deciding on when to get this shot. Call our office for a nurse visit appointment to get this. The second shot of the series is less severe regarding the side effects, but it still lasts 48 hours.   Make sure you have good arch support in your shoes. If the foot is not improving in the next 3-4 weeks, please let me know.   Let us know if you need anything.  Pectoralis Major Rehab Ask your health care provider which exercises are safe for you. Do exercises exactly as told by your health care provider and adjust them as directed. It is normal to feel mild stretching, pulling, tightness, or discomfort as you do these exercises, but you should stop right away if you feel sudden pain or your pain gets worse. Do not begin these exercises until told by your health care provider. Stretching and range of motion exercises These exercises warm up your muscles and joints and improve the movement and flexibility of your shoulder. These exercises can also help to relieve pain, numbness, and tingling. Exercise A: Pendulum  Stand near a wall or a surface that you can hold onto for balance. Bend at the waist and let your left / right arm hang straight down. Use your other arm to keep your balance. Relax your arm and shoulder muscles, and move your hips and your trunk so your left / right arm swings freely. Your  arm should swing because of the motion of your body, not because you are using your arm or shoulder muscles. Keep moving so your arm swings in the following directions, as told by your health care provider: Side to side. Forward and backward. In clockwise and counterclockwise circles. Slowly return to the starting position. Repeat 2 times. Complete this exercise 3 times per week. Exercise B: Abduction, standing Stand and hold a broomstick, a cane, or a similar object. Place your hands a little more than shoulder-width apart on the object. Your left / right hand should be palm-up, and your other hand should be palm-down. While keeping your elbow straight and your shoulder muscles relaxed, push the stick across your body toward your left / right side. Raise your left / right arm to the side of your body and then over your head until you feel a stretch in your shoulder. Stop when you reach the angle that is recommended by your health care provider. Avoid shrugging your shoulder while you raise your arm. Keep your shoulder blade tucked down toward the middle of your spine. Hold for 10 seconds. Slowly return to the starting position. Repeat 2 times. Complete this exercise 3 times per week. Exercise C: Wand flexion, supine  Lie on your back. You may bend your knees for comfort. Hold a broomstick, a cane, or a similar object so that your hands are about shoulder-width apart on the object.  Your palms should face toward your feet. Raise your left / right arm in front of your face, then behind your head (toward the floor). Use your other hand to help you do this. Stop when you feel a gentle stretch in your shoulder, or when you reach the angle that is recommended by your health care provider. Hold for 3 seconds. Use the broomstick and your other arm to help you return your left / right arm to the starting position. Repeat 2 times. Complete this exercise 3 times per week. Exercise D: Wand shoulder  external rotation Stand and hold a broomstick, a cane, or a similar object so your hands are about shoulder-width apart on the object. Start with your arms hanging down, then bend both elbows to an "L" shape (90 degrees). Keep your left / right elbow at your side. Use your other hand to push the stick so your left / right forearm moves away from your body, out to your side. Keep your left / right elbow bent to 90 degrees and keep it against your side. Stop when you feel a gentle stretch in your shoulder, or when you reach the angle recommended by your health care provider. Hold for 10 seconds. Use the stick to help you return your left / right arm to the starting position. Repeat 2 times. Complete this exercise 3 times per week. Strengthening exercises These exercises build strength and endurance in your shoulder. Endurance is the ability to use your muscles for a long time, even after your muscles get tired. Exercise E: Scapular protraction, standing Stand so you are facing a wall. Place your feet about one arm-length away from the wall. Place your hands on the wall and straighten your elbows. Keep your hands on the wall as you push your upper back away from the wall. You should feel your shoulder blades sliding forward. Keep your elbows and your head still. If you are not sure that you are doing this exercise correctly, ask your health care provider for more instructions. Hold for 3 seconds. Slowly return to the starting position. Let your muscles relax completely before you repeat this exercise. Repeat 2 times. Complete this exercise 3 times per week. Exercise F: Shoulder blade squeezes  (scapular retraction) Sit with good posture in a stable chair. Do not let your back touch the back of the chair. Your arms should be at your sides with your elbows bent. You may rest your forearms on a pillow if that is more comfortable. Squeeze your shoulder blades together. Bring them down and back. Keep  your shoulders level. Do not lift your shoulders up toward your ears. Hold for 3 seconds. Return to the starting position. Repeat 2 times. Complete this exercise 3 times per week. This information is not intended to replace advice given to you by your health care provider. Make sure you discuss any questions you have with your health care provider. Document Released: 06/23/2005 Document Revised: 04/03/2016 Document Reviewed: 03/11/2015 Elsevier Interactive Patient Education  [employer].

## 2023-01-20 NOTE — Progress Notes (Signed)
Chief Complaint  Patient presents with   Annual Exam   Foot Pain    Left Pain in arm pit/under left arm      Well Woman Lindsay Wade is here for a complete physical.   Her last physical was >1 year ago.  Current diet: in general, diet could be better. Current exercise: walking. Weight is up a few lbs and she denies fatigue out of ordinary. No LMP recorded. Patient is postmenopausal. Seatbelt? Yes Advanced directive? Yes  Health Maintenance Pap/HPV- Yes Mammogram- Yes Colon cancer screening-Yes Shingrix- No Tetanus- Yes Hep C screening- Yes HIV screening- Yes  Foot pain 2 weeks of L foot pain. No inj or change activity. Hurts over the top of her foot between the 1st and 2nd foot. Comes and goes. Associated swelling. No fevers, redness, drainage. Walking makes it worse when she is done. No neuro s/s's. Used ice and NSAIDs w some relief.   Past Medical History:  Diagnosis Date   Essential hypertension 04/15/2017   GERD (gastroesophageal reflux disease)    Hashimoto's thyroiditis    Headache    not lately   Hypothyroidism    MRSA infection    OSA (obstructive sleep apnea)    CPAP   PONV (postoperative nausea and vomiting)    after tonsillectomy     Past Surgical History:  Procedure Laterality Date   BTS     CARPAL TUNNEL RELEASE Left 11/20/2021   Procedure: LEFT CARPAL TUNNEL RELEASE;  Surgeon: Marlyne Beards, MD;  Location: [facility];  Service: Orthopedics;  Laterality: Left;   CESAREAN SECTION     COLONOSCOPY WITH PROPOFOL N/A 03/15/2021   Procedure: COLONOSCOPY WITH PROPOFOL;  Surgeon: Midge Minium, MD;  Location: [facility];  Service: Endoscopy;  Laterality: N/A;   ESOPHAGOGASTRODUODENOSCOPY (EGD) WITH PROPOFOL N/A 03/15/2021   Procedure: ESOPHAGOGASTRODUODENOSCOPY (EGD) and dillation WITH PROPOFOL;  Surgeon: Midge Minium, MD;  Location: [facility];  Service: Endoscopy;  Laterality: N/A;  sleep apnea requests early    ESOPHAGOGASTRODUODENOSCOPY (EGD) WITH PROPOFOL N/A 12/04/2022   Procedure: ESOPHAGOGASTRODUODENOSCOPY (EGD) WITH PROPOFOL;  Surgeon: Midge Minium, MD;  Location: [facility];  Service: Endoscopy;  Laterality: N/A;   TONSILLECTOMY     uterine ablation  2005    Medications  Current Outpatient Medications on File Prior to Visit  Medication Sig Dispense Refill   lisinopril (ZESTRIL) 20 MG tablet Take 1 tablet (20 mg total) by mouth daily. (Patient taking differently: Take 20 mg by mouth daily. pm) 90 tablet 3   methocarbamol (ROBAXIN) 500 MG tablet Take 1 tablet (500 mg total) by mouth 4 (four) times daily. (Patient taking differently: Take 500 mg by mouth as needed.) 40 tablet 0   pantoprazole (PROTONIX) 40 MG tablet Take 1 tablet (40 mg total) by mouth daily. SCHEDULE FOLLOW UP VISIT 30 tablet 1   SYNTHROID 125 MCG tablet Take 1 tablet (125 mcg total) by mouth daily before breakfast. 90 tablet 3   tiZANidine (ZANAFLEX) 4 MG tablet Take 1 tablet (4 mg total) by mouth every 6 (six) hours as needed for muscle spasms. 30 tablet 0   Vonoprazan Fumarate (VOQUEZNA) 20 MG TABS Take 1 tablet by mouth daily. 12 tablet 0   Vonoprazan Fumarate (VOQUEZNA) 20 MG TABS Take 1 tablet by mouth daily. 30 tablet 11    Allergies No Known Allergies  Review of Systems: Constitutional:  no unexpected weight changes Eye:  no recent significant change in vision Ear/Nose/Mouth/Throat:  Ears:  no  recent change in hearing Nose/Mouth/Throat:  no complaints of nasal congestion, no sore throat Cardiovascular: no chest pain Respiratory:  no shortness of breath Gastrointestinal:  no abdominal pain, no change in bowel habits GU:  Female: negative for dysuria or pelvic pain Musculoskeletal/Extremities: +L foot pain Integumentary (Skin/Breast):  no abnormal skin lesions reported Neurologic:  no headaches Endocrine:  denies fatigue  Exam BP 120/80 (BP Location: Left Arm, Patient Position: Sitting, Cuff Size:  Large)   Pulse 62   Temp 98.7 F (37.1 C) (Oral)   Ht 5\' 6"  (1.676 m)   Wt 264 lb (119.7 kg)   SpO2 97%   BMI 42.61 kg/m  General:  well developed, well nourished, in no apparent distress Skin:  no significant moles, warts, or growths Head:  no masses, lesions, or tenderness Eyes:  pupils equal and round, sclera anicteric without injection Ears:  canals without lesions, TMs shiny without retraction, no obvious effusion, no erythema Nose:  nares patent, mucosa normal, and no drainage  Throat/Pharynx:  lips and gingiva without lesion; tongue and uvula midline; non-inflamed pharynx; no exudates or postnasal drainage Neck: neck supple without adenopathy, thyromegaly, or masses Lungs:  clear to auscultation, breath sounds equal bilaterally, no respiratory distress Cardio:  regular rate and rhythm, no LE edema Abdomen:  abdomen soft, nontender; bowel sounds normal; no masses or organomegaly Genital: Defer to GYN Musculoskeletal: +ttp over the R insertion of pec minor; ttp over the dorsum of the left foot between the first and second metatarsals.  There is no edema, joint TTP, erythema, excessive warmth, or deformity.  Otherwise symmetrical muscle groups noted without atrophy or deformity Extremities:  no clubbing, cyanosis, or edema, no deformities, no skin discoloration Neuro:  gait normal; deep tendon reflexes normal and symmetric Psych: well oriented with normal range of affect and appropriate judgment/insight  Assessment and Plan  Well adult exam - Plan: Lipid panel, CBC, Comprehensive metabolic panel  Left foot pain - Plan: Uric acid  Pectoralis major tendinitis, right   Well 57 y.o. female. Counseled on diet and exercise. Other orders as above. Shingrix rec'd.  L foot pain: ck urate.  Recommended better arch support.  Will refer to podiatry if no improvement in next 3 to 4 weeks. Pectoralis pain: Heat, ice, Tylenol, stretches and exercises.  Physical therapy if no  improvement. Advanced directive form requested today.  Follow up in 6 mo. The patient voiced understanding and agreement to the plan.  Jilda Roche [place], DO 01/20/23 8:08 AM

## 2023-01-27 ENCOUNTER — Encounter: Payer: Commercial Managed Care - PPO | Admitting: Physical Therapy

## 2023-01-28 ENCOUNTER — Other Ambulatory Visit (HOSPITAL_BASED_OUTPATIENT_CLINIC_OR_DEPARTMENT_OTHER): Payer: Self-pay

## 2023-02-02 ENCOUNTER — Encounter: Payer: Self-pay | Admitting: Family Medicine

## 2023-02-02 ENCOUNTER — Ambulatory Visit (INDEPENDENT_AMBULATORY_CARE_PROVIDER_SITE_OTHER): Payer: Commercial Managed Care - PPO | Admitting: Dermatology

## 2023-02-02 ENCOUNTER — Ambulatory Visit: Payer: Commercial Managed Care - PPO | Admitting: Dermatology

## 2023-02-02 ENCOUNTER — Encounter: Payer: Self-pay | Admitting: Dermatology

## 2023-02-02 VITALS — BP 157/106 | HR 75

## 2023-02-02 DIAGNOSIS — D1801 Hemangioma of skin and subcutaneous tissue: Secondary | ICD-10-CM | POA: Diagnosis not present

## 2023-02-02 DIAGNOSIS — W908XXA Exposure to other nonionizing radiation, initial encounter: Secondary | ICD-10-CM

## 2023-02-02 DIAGNOSIS — L821 Other seborrheic keratosis: Secondary | ICD-10-CM

## 2023-02-02 DIAGNOSIS — D485 Neoplasm of uncertain behavior of skin: Secondary | ICD-10-CM

## 2023-02-02 DIAGNOSIS — L578 Other skin changes due to chronic exposure to nonionizing radiation: Secondary | ICD-10-CM | POA: Diagnosis not present

## 2023-02-02 DIAGNOSIS — D225 Melanocytic nevi of trunk: Secondary | ICD-10-CM | POA: Diagnosis not present

## 2023-02-02 DIAGNOSIS — D229 Melanocytic nevi, unspecified: Secondary | ICD-10-CM

## 2023-02-02 DIAGNOSIS — Z1283 Encounter for screening for malignant neoplasm of skin: Secondary | ICD-10-CM

## 2023-02-02 DIAGNOSIS — D492 Neoplasm of unspecified behavior of bone, soft tissue, and skin: Secondary | ICD-10-CM | POA: Diagnosis not present

## 2023-02-02 DIAGNOSIS — R0989 Other specified symptoms and signs involving the circulatory and respiratory systems: Secondary | ICD-10-CM | POA: Diagnosis not present

## 2023-02-02 DIAGNOSIS — D239 Other benign neoplasm of skin, unspecified: Secondary | ICD-10-CM

## 2023-02-02 DIAGNOSIS — L814 Other melanin hyperpigmentation: Secondary | ICD-10-CM | POA: Diagnosis not present

## 2023-02-02 HISTORY — DX: Other benign neoplasm of skin, unspecified: D23.9

## 2023-02-02 HISTORY — DX: Melanocytic nevi, unspecified: D22.9

## 2023-02-02 NOTE — Progress Notes (Signed)
   New Patient Visit   Subjective  Lindsay Wade is a 57 y.o. female who presents for the following: TBSE  Patient states she is here today to establish care and to have a TBSE. Patient reports has not previously been treated for these areas. Patient reports Hx of bx on her back at the age of 57 yrs old (Unsure of dx). Patient denies family history of skin cancer(s). Throughout her lifetime she has had excessive sun exposure, she reports she has had several sun burns and never wore sunscreen. Today, she reports she doesn't get much sun exposure and if she does not wear sunscreen. She also reports hx of MRSA on B/L lower extremities.   The following portions of the chart were reviewed this encounter and updated as appropriate: medications, allergies, medical history  Review of Systems:  No other skin or systemic complaints except as noted in HPI or Assessment and Plan.  Objective  Well appearing patient in no apparent distress; mood and affect are within normal limits.  A full examination was performed including scalp, head, eyes, ears, nose, lips, neck, chest, axillae, abdomen, back, buttocks, bilateral upper extremities, bilateral lower extremities, hands, feet, fingers, toes, fingernails, and toenails. All findings within normal limits unless otherwise noted below.   Relevant exam findings are noted in the Assessment and Plan.             Assessment & Plan   LENTIGINES, SEBORRHEIC KERATOSES, CHERRY ANGIOMAS - Benign normal skin lesions - Benign-appearing - Call for any changes  MELANOCYTIC NEVI Exam: 3 mm Tan-brown and/or pink-flesh-colored symmetric macules and papules  Treatment Plan:  - Benign appearing on exam today - Observation - Call clinic for new or changing moles - Recommend daily use of broad spectrum spf 30+ sunscreen to sun-exposed areas.   ACTINIC DAMAGE - Chronic condition, secondary to cumulative UV/sun exposure - diffuse scaly erythematous macules with  underlying dyspigmentation - Recommend daily broad spectrum sunscreen SPF 30+ to sun-exposed areas, reapply every 2 hours as needed.  - Staying in the shade or wearing long sleeves, sun glasses (UVA+UVB protection) and wide brim hats (4-inch brim around the entire circumference of the hat) are also recommended for sun protection.  - Call for new or changing lesions.  SKIN CANCER SCREENING PERFORMED TODAY  No follow-ups on file.  Documentation: I have reviewed the above documentation for accuracy and completeness, and I agree with the above.  Stasia Cavalier, am acting as scribe for Langston Reusing, DO.  Langston Reusing, DO

## 2023-02-02 NOTE — Patient Instructions (Addendum)

## 2023-02-03 ENCOUNTER — Telehealth: Payer: Self-pay | Admitting: Family Medicine

## 2023-02-03 NOTE — Telephone Encounter (Signed)
Spoke with patient to see if she would like to schedule appointment with Dr. Carmelia Roller and she states she was just letting him know that the ENT provider was going to get in touch with him to talk about medication change.

## 2023-02-03 NOTE — Telephone Encounter (Signed)
LVM and sent mychart msg informing pt of need to reschedule 02/19/23 appt - NP out

## 2023-02-05 ENCOUNTER — Ambulatory Visit: Payer: Commercial Managed Care - PPO | Admitting: Physical Therapy

## 2023-02-05 ENCOUNTER — Other Ambulatory Visit (HOSPITAL_BASED_OUTPATIENT_CLINIC_OR_DEPARTMENT_OTHER): Payer: Self-pay

## 2023-02-06 ENCOUNTER — Other Ambulatory Visit (HOSPITAL_BASED_OUTPATIENT_CLINIC_OR_DEPARTMENT_OTHER): Payer: Self-pay

## 2023-02-09 ENCOUNTER — Encounter (INDEPENDENT_AMBULATORY_CARE_PROVIDER_SITE_OTHER): Payer: Commercial Managed Care - PPO | Admitting: Family Medicine

## 2023-02-09 DIAGNOSIS — I10 Essential (primary) hypertension: Secondary | ICD-10-CM | POA: Diagnosis not present

## 2023-02-10 ENCOUNTER — Other Ambulatory Visit (HOSPITAL_BASED_OUTPATIENT_CLINIC_OR_DEPARTMENT_OTHER): Payer: Self-pay

## 2023-02-11 ENCOUNTER — Other Ambulatory Visit (HOSPITAL_BASED_OUTPATIENT_CLINIC_OR_DEPARTMENT_OTHER): Payer: Self-pay

## 2023-02-11 MED ORDER — AMLODIPINE BESYLATE 5 MG PO TABS
5.0000 mg | ORAL_TABLET | Freq: Every day | ORAL | 3 refills | Status: DC
  Filled 2023-02-11: qty 30, 30d supply, fill #0

## 2023-02-11 NOTE — Progress Notes (Signed)
Lindsay Wade,  Please call pt and notify that their bx results showed an abnormal mole that requires a full excision in office with Dr Onalee Hua  Please schedule a regular appointment slot for 2 shave excisions   Diagnosis 1. Skin , left flank DYSPLASTIC COMPOUND NEVUS WITH MODERATE ATYPIA, DEEP MARGIN INVOLVED  --> Shave excision 2. Skin , left lower leg - posterior ATYPICAL INTRAEPIDERMAL MELANOCYTIC PROLIFERATION, MARGIN CLOSE, SEE DESCRIPTION --> Shave excision

## 2023-02-11 NOTE — Telephone Encounter (Signed)
Please see the MyChart message reply(ies) for my assessment and plan.  The patient gave consent for this Medical Advice Message and is aware that it may result in a bill to their insurance company as well as the possibility that this may result in a co-payment or deductible. They are an established patient, but are not seeking medical advice exclusively about a problem treated during an in person or video visit in the last 7 days. I did not recommend an in person or video visit within 7 days of my reply.  I spent a total of 7 minutes cumulative time within 7 days through MyChart messaging Amiir Heckard Paul Melanie Openshaw, DO  

## 2023-02-13 ENCOUNTER — Other Ambulatory Visit (HOSPITAL_BASED_OUTPATIENT_CLINIC_OR_DEPARTMENT_OTHER): Payer: Self-pay

## 2023-02-13 ENCOUNTER — Encounter: Payer: Commercial Managed Care - PPO | Admitting: Physical Therapy

## 2023-02-16 ENCOUNTER — Telehealth: Payer: Self-pay

## 2023-02-16 NOTE — Telephone Encounter (Signed)
I spoke with patient today, gave results, and scheduled a procedure.

## 2023-02-16 NOTE — Telephone Encounter (Signed)
-----   Message from Langston Reusing sent at 02/11/2023 10:30 AM EDT ----- Lindsay Wade,  Please call pt and notify that their bx results showed an abnormal mole that requires a full excision in office with Dr Onalee Hua  Please schedule a regular appointment slot for 2 shave excisions   Diagnosis 1. Skin , left flank DYSPLASTIC COMPOUND NEVUS WITH MODERATE ATYPIA, DEEP MARGIN INVOLVED  --> Shave excision 2. Skin , left lower leg - posterior ATYPICAL INTRAEPIDERMAL MELANOCYTIC PROLIFERATION, MARGIN CLOSE, SEE DESCRIPTION --> Shave excision

## 2023-02-19 ENCOUNTER — Ambulatory Visit: Payer: Commercial Managed Care - PPO | Admitting: Family Medicine

## 2023-02-20 ENCOUNTER — Ambulatory Visit: Payer: Commercial Managed Care - PPO | Attending: Orthopedic Surgery | Admitting: Physical Therapy

## 2023-02-20 ENCOUNTER — Encounter: Payer: Self-pay | Admitting: Physical Therapy

## 2023-02-20 DIAGNOSIS — M6281 Muscle weakness (generalized): Secondary | ICD-10-CM | POA: Insufficient documentation

## 2023-02-20 DIAGNOSIS — M5459 Other low back pain: Secondary | ICD-10-CM | POA: Diagnosis not present

## 2023-02-20 NOTE — Therapy (Signed)
OUTPATIENT PHYSICAL THERAPY TREATMENT   Patient Name: Lindsay Wade MRN: [id number] DOB:1966-03-21, 57 y.o., female Today's Date: 02/20/2023  END OF SESSION:  PT End of Session - 02/20/23 0937     Visit Number 2    Number of Visits 12    Date for PT Re-Evaluation 04/03/23    Authorization Type [employer]    PT Start Time 0935    PT Stop Time 1017    PT Time Calculation (min) 42 min    Activity Tolerance Patient tolerated treatment well    Behavior During Therapy 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   Dysplastic nevus 02/02/2023   moderate   Essential hypertension 04/15/2017   GERD (gastroesophageal reflux disease)    Hashimoto's thyroiditis    Headache    not lately   Hypothyroidism    MRSA infection    OSA (obstructive sleep apnea)    CPAP   PONV (postoperative nausea and vomiting)    after tonsillectomy   Past Surgical History:  Procedure Laterality Date   BTS     CARPAL TUNNEL RELEASE Left 11/20/2021   Procedure: LEFT CARPAL TUNNEL RELEASE;  Surgeon: Marlyne Beards, MD;  Location: [facility];  Service: Orthopedics;  Laterality: Left;   CESAREAN SECTION     COLONOSCOPY WITH PROPOFOL N/A 03/15/2021   Procedure: COLONOSCOPY WITH PROPOFOL;  Surgeon: Midge Minium, MD;  Location: [facility];  Service: Endoscopy;  Laterality: N/A;   ESOPHAGOGASTRODUODENOSCOPY (EGD) WITH PROPOFOL N/A 03/15/2021   Procedure: ESOPHAGOGASTRODUODENOSCOPY (EGD) and dillation WITH PROPOFOL;  Surgeon: Midge Minium, MD;  Location: [facility];  Service: Endoscopy;  Laterality: N/A;  sleep apnea requests early   ESOPHAGOGASTRODUODENOSCOPY (EGD) WITH PROPOFOL N/A 12/04/2022   Procedure: ESOPHAGOGASTRODUODENOSCOPY (EGD) WITH PROPOFOL;  Surgeon: Midge Minium, MD;  Location: [facility];  Service: Endoscopy;  Laterality: N/A;   TONSILLECTOMY      uterine ablation  2005   Patient Active Problem List   Diagnosis Date Noted   Primary osteoarthritis 12/22/2022   Body mass index (BMI) 40.0-44.9, adult (HCC) 12/22/2022   Carpal tunnel syndrome, left upper limb    Screen for colon cancer    Polyp of sigmoid colon    Gastroesophageal reflux disease with esophagitis without hemorrhage    Joint synovitis 07/03/2020   Obstructive sleep apnea treated with BiPAP 10/06/2017   Occipital neuralgia of right side 06/03/2017   Uncontrolled morning headache 05/26/2017   HTN (hypertension), malignant 05/26/2017   Snoring 05/26/2017   Occipital headache 04/24/2017   Essential hypertension 04/15/2017   Hypothyroid     PCP: Sharlene Dory, DO   REFERRING PROVIDER: London Sheer, MD  REFERRING DIAG: M54.50,G89.29 (ICD-10-CM) - Chronic midline low back pain without sciatica   Rationale for Evaluation and Treatment: Rehabilitation  THERAPY DIAG:  Other low back pain  Muscle weakness (generalized)  ONSET DATE: 23 years ago  SUBJECTIVE:  SUBJECTIVE STATEMENT: Feeling good this morning, but back has still been bad, especially walking and cleaning.      PERTINENT HISTORY:  HTN, Hashimoto's thyroiditis/hypothyroidism, MRSA, GERD, h/o C-section x 2, osteoarthritis  PAIN:  Are you having pain? Yes: NPRS scale: 0/10 Pain location: right side low back  Pain description: constant, like a rock in your shoe, burning at worst Aggravating factors: prolonged sitting, moving, lifting, bending, standing, walking Relieving factors: heating pack, muscle relaxers, bengay  PRECAUTIONS: None  RED FLAGS: None   WEIGHT BEARING RESTRICTIONS: No  FALLS:  Has patient fallen in last 6 months? No  LIVING ENVIRONMENT: Lives with: lives with their family Lives  in: House/apartment Stairs: Yes: Internal: 14 steps; on left going up Has following equipment at home: None  OCCUPATION: Energy manager, works from home  PLOF: Independent  PATIENT GOALS: decrease pain  NEXT MD VISIT: 01/20/23 with Dr. Carmelia Roller, follow-up if needed  OBJECTIVE:   DIAGNOSTIC FINDINGS:  11/28/2022 MR lumbar spine Disc levels:   T12-L1: No significant disc protrusion, foraminal stenosis, or canal stenosis.  L1-L2: No significant disc protrusion, foraminal stenosis, or canal stenosis.  L2-L3: Disc bulging, ligamentum flavum thickening facet arthropathy. Mild canal stenosis. Mild bilateral foraminal stenosis. L3-L4: Mild disc bulging and facet arthropathy without significant canal stenosis. Mild bilateral foraminal stenosis.  L4-L5: Mild disc bulging. Facet arthropathy. No significant canal or foraminal stenosis.  L5-S1: Left eccentric disc bulging with mild left foraminal stenosis. No significant canal or right foraminal stenosis.   IMPRESSION: Mild foraminal stenosis bilaterally at L2-L3 and L3-L4 and on the left at L5-S1. Mild canal stenosis at L2-L3.  PATIENT SURVEYS:  Modified Oswestry 16/50*   *describes pain much worse than reflected on oswestry  COGNITION: Overall cognitive status: Within functional limits for tasks assessed     SENSATION: WFL  MUSCLE LENGTH: Hamstrings: Right 90 deg; Left 90 deg Quads: moderate tightness bilaterally.   POSTURE: No Significant postural limitations  PALPATION: Tenderness R lumbar lumbar paraspinals, R QL, R SIJ, R glut med, R piriformis.   LUMBAR ROM:  full AROM, discomfort R side all motions, R SB and R rotation worst.   LOWER EXTREMITY ROM:   WNL, noted more hip IR than ER, history of W-sitting in youth  LOWER EXTREMITY MMT:   5/5 LE strength bil, can wall on heels and toes.   Decreased core strength.   LUMBAR SPECIAL TESTS:  Straight leg raise test: Negative and Long sit test: Positive  GAIT: Distance walked:  38' Comments: no device or deviation.   TODAY'S TREATMENT:                                                                                                                              DATE:   02/20/23 Therapeutic Exercise: to improve strength and mobility.  Demo, verbal and tactile cues throughout for technique. Nustep L5 x 5 min  MET- 2 rounds isometric R hip extension 5 x 5 sec  hold - negative retest Isometric hip abduction/adduction 5 sec holds PPT 5 x 10 sec hold PPT with [place] Bridge with PPT x 10  Prone leg extension x 10  Manual Therapy: to decrease muscle spasm and pain and improve mobility IASTM with foam roller to R glutes and QL   01/16/23 Eval, patient education    PATIENT EDUCATION:  Education details: initial HEP Person educated: Patient Education method: Programmer, multimedia, Facilities manager, Verbal cues, and Handouts Education comprehension: verbalized understanding and returned demonstration  HOME EXERCISE PROGRAM: Access Code: [email or web address] URL: [email or web address] Date: 02/20/2023 Prepared by: Harrie Foreman  Exercises - Supine Posterior Pelvic Tilt  - 1 x daily - 7 x weekly - 3 sets - 10 reps - Supine Bridge  - 1 x daily - 7 x weekly - 3 sets - 10 reps - Supine Hip Adduction Isometric with Ball  - 1 x daily - 7 x weekly - 3 sets - 10 reps  ASSESSMENT:  CLINICAL IMPRESSION: Lindsay Wade returned today for first visit after initial evaluation.  Discussed difficulties with scheduling, will extend her plan of care and make sure she is scheduled with preferred therapist.  Today assessed supine to long sit test which was positive on Right, after MET (2 rounds) supine to long sit test was negative.  Followed up with SI stabilization and core stabilization exercises.  Noted tenderness primarily in R piriformis, QL and glutes, today focused on IASTM to area which decreased muscle tension and discussed TrDN in future.  Andree Moro continues to  demonstrate potential for improvement and would benefit from continued skilled therapy to address impairments.    OBJECTIVE IMPAIRMENTS: decreased activity tolerance, decreased strength, increased fascial restrictions, increased muscle spasms, and pain.   ACTIVITY LIMITATIONS: carrying, lifting, bending, standing, squatting, sleeping, and locomotion level  PARTICIPATION LIMITATIONS: meal prep, cleaning, laundry, and community activity  PERSONAL FACTORS: Time since onset of injury/illness/exacerbation and 1-2 comorbidities: HTN, Hashimoto's thyroiditis/hypothyroidism, MRSA, GERD, h/o C-section x 2, osteoarthritis  are also affecting patient's functional outcome.   REHAB POTENTIAL: Good  CLINICAL DECISION MAKING: Evolving/moderate complexity  EVALUATION COMPLEXITY: Moderate   GOALS: Goals reviewed with patient? Yes  SHORT TERM GOALS: Target date: 03/06/2023   Patient will be independent with initial HEP.  Baseline:  Goal status: IN PROGRESS 02/20/23- given  2.  Patient will complete FOTO Baseline: not done Goal status: INITIAL   LONG TERM GOALS: Target date: 04/03/2023    Patient will be independent with advanced/ongoing HEP to improve outcomes and carryover.  Baseline:  Goal status: IN PROGRESS  2.  Patient will report 75% improvement in R sided low back pain/SIJ to improve QOL.  Baseline:  Goal status: IN PROGRESS  3.   Patient will report predicted outcome on lumbar FOTO to demonstrate improved functional ability.  Baseline: TBD Goal status: IN PROGRESS   4.  Patient will tolerate >30 min of standing and walking to perform household ADLS like cooking and cleaning without increased LBP pain. Baseline: burning pain after 15 min Goal status: IN PROGRESS  5.  Patient will report 6 points improvement on Modified [place]. Baseline: 16/50 Goal status: IN PROGRESS  PLAN:  PT FREQUENCY: 1-2x/week  PT DURATION: 6 weeks  PLANNED INTERVENTIONS: Therapeutic exercises,  Therapeutic activity, Neuromuscular re-education, Balance training, Gait training, Patient/Family education, Self Care, Joint mobilization, Joint manipulation, Stair training, Orthotic/Fit training, Dry Needling, Electrical stimulation, Spinal manipulation, Spinal mobilization, Cryotherapy, Moist heat, Taping, Traction, Ultrasound, Manual therapy, and Re-evaluation.  PLAN FOR NEXT SESSION: FOTO,  check supine to long sit test and correct, check preference for SIJ exercises, start with NS exercises for core strengtheing, manual therapy and modalities PRN, interested in TrDN   Jena Gauss, PT, DPT 02/20/2023, 12:49 PM

## 2023-02-27 ENCOUNTER — Ambulatory Visit: Payer: Commercial Managed Care - PPO | Admitting: Physical Therapy

## 2023-03-02 ENCOUNTER — Telehealth: Payer: Self-pay | Admitting: Family Medicine

## 2023-03-02 NOTE — Telephone Encounter (Signed)
Pt is hoping to be seen this week. She states she would like to discuss bp meds bc she thinks the one she is on is causing ankle swelling. She is hoping to do Friday at 10:30, advised pt pcp is fully booked at that time but she wanted a message sent back to see if he is willing to over book. Please advise.

## 2023-03-02 NOTE — Telephone Encounter (Signed)
She can do 11:45 if open or 4:15 if open

## 2023-03-06 ENCOUNTER — Encounter: Payer: Self-pay | Admitting: Physical Therapy

## 2023-03-06 ENCOUNTER — Ambulatory Visit: Payer: Commercial Managed Care - PPO | Admitting: Physical Therapy

## 2023-03-06 ENCOUNTER — Telehealth: Payer: Commercial Managed Care - PPO | Admitting: Family Medicine

## 2023-03-06 DIAGNOSIS — M5459 Other low back pain: Secondary | ICD-10-CM | POA: Diagnosis not present

## 2023-03-06 DIAGNOSIS — M6281 Muscle weakness (generalized): Secondary | ICD-10-CM

## 2023-03-06 NOTE — Therapy (Addendum)
OUTPATIENT PHYSICAL THERAPY TREATMENT/Discharge Summary   Patient Name: Lindsay Wade MRN: [id number] DOB:[birth year], 57 y.o., female Today's Date: 03/06/2023  END OF SESSION:  PT End of Session - 03/06/23 0938     Visit Number 3    Number of Visits 12    Date for PT Re-Evaluation 04/03/23    Authorization Type [employer]    PT Start Time 0935    PT Stop Time 1030    PT Time Calculation (min) 55 min    Activity Tolerance Patient tolerated treatment well    Behavior During Therapy [facility] for tasks assessed/performed             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   Dysplastic nevus 02/02/2023   moderate   Essential hypertension 04/15/2017   GERD (gastroesophageal reflux disease)    Hashimoto's thyroiditis    Headache    not lately   Hypothyroidism    MRSA infection    OSA (obstructive sleep apnea)    CPAP   PONV (postoperative nausea and vomiting)    after tonsillectomy   Past Surgical History:  Procedure Laterality Date   BTS     CARPAL TUNNEL RELEASE Left 11/20/2021   Procedure: LEFT CARPAL TUNNEL RELEASE;  Surgeon: Marlyne Beards, MD;  Location: [facility];  Service: Orthopedics;  Laterality: Left;   CESAREAN SECTION     COLONOSCOPY WITH PROPOFOL N/A 03/15/2021   Procedure: COLONOSCOPY WITH PROPOFOL;  Surgeon: Midge Minium, MD;  Location: [facility];  Service: Endoscopy;  Laterality: N/A;   ESOPHAGOGASTRODUODENOSCOPY (EGD) WITH PROPOFOL N/A 03/15/2021   Procedure: ESOPHAGOGASTRODUODENOSCOPY (EGD) and dillation WITH PROPOFOL;  Surgeon: Midge Minium, MD;  Location: [facility];  Service: Endoscopy;  Laterality: N/A;  sleep apnea requests early   ESOPHAGOGASTRODUODENOSCOPY (EGD) WITH PROPOFOL N/A 12/04/2022   Procedure: ESOPHAGOGASTRODUODENOSCOPY (EGD) WITH PROPOFOL;  Surgeon: Midge Minium, MD;  Location: [facility];  Service: Endoscopy;  Laterality: N/A;    TONSILLECTOMY     uterine ablation  2005   Patient Active Problem List   Diagnosis Date Noted   Primary osteoarthritis 12/22/2022   Body mass index (BMI) 40.0-44.9, adult (HCC) 12/22/2022   Carpal tunnel syndrome, left upper limb    Screen for colon cancer    Polyp of sigmoid colon    Gastroesophageal reflux disease with esophagitis without hemorrhage    Joint synovitis 07/03/2020   Obstructive sleep apnea treated with BiPAP 10/06/2017   Occipital neuralgia of right side 06/03/2017   Uncontrolled morning headache 05/26/2017   HTN (hypertension), malignant 05/26/2017   Snoring 05/26/2017   Occipital headache 04/24/2017   Essential hypertension 04/15/2017   Hypothyroid     PCP: Sharlene Dory, DO   REFERRING PROVIDER: London Sheer, MD  REFERRING DIAG: M54.50,G89.29 (ICD-10-CM) - Chronic midline low back pain without sciatica   Rationale for Evaluation and Treatment: Rehabilitation  THERAPY DIAG:  Other low back pain  Muscle weakness (generalized)  ONSET DATE: 23 years ago  SUBJECTIVE:  SUBJECTIVE STATEMENT: Had a bad day yesterday cooking "I can't even make dinner!"     PERTINENT HISTORY:  HTN, Hashimoto's thyroiditis/hypothyroidism, MRSA, GERD, h/o C-section x 2, osteoarthritis  PAIN:  Are you having pain? Yes: NPRS scale: 0/10 Pain location: right side low back  Pain description: constant, like a rock in your shoe, burning at worst Aggravating factors: prolonged sitting, moving, lifting, bending, standing, walking Relieving factors: heating pack, muscle relaxers, bengay  PRECAUTIONS: None  RED FLAGS: None   WEIGHT BEARING RESTRICTIONS: No  FALLS:  Has patient fallen in last 6 months? No  LIVING ENVIRONMENT: Lives with: lives with their family Lives in:  House/apartment Stairs: Yes: Internal: 14 steps; on left going up Has following equipment at home: None  OCCUPATION: Energy manager, works from home  PLOF: Independent  PATIENT GOALS: decrease pain  NEXT MD VISIT: 01/20/23 with Dr. Carmelia Roller, follow-up if needed  OBJECTIVE:   DIAGNOSTIC FINDINGS:  11/28/2022 MR lumbar spine Disc levels:   T12-L1: No significant disc protrusion, foraminal stenosis, or canal stenosis.  L1-L2: No significant disc protrusion, foraminal stenosis, or canal stenosis.  L2-L3: Disc bulging, ligamentum flavum thickening facet arthropathy. Mild canal stenosis. Mild bilateral foraminal stenosis. L3-L4: Mild disc bulging and facet arthropathy without significant canal stenosis. Mild bilateral foraminal stenosis.  L4-L5: Mild disc bulging. Facet arthropathy. No significant canal or foraminal stenosis.  L5-S1: Left eccentric disc bulging with mild left foraminal stenosis. No significant canal or right foraminal stenosis.   IMPRESSION: Mild foraminal stenosis bilaterally at L2-L3 and L3-L4 and on the left at L5-S1. Mild canal stenosis at L2-L3.  PATIENT SURVEYS:  Modified Oswestry 16/50*   *describes pain much worse than reflected on oswestry  COGNITION: Overall cognitive status: Within functional limits for tasks assessed     SENSATION: WFL  MUSCLE LENGTH: Hamstrings: Right 90 deg; Left 90 deg Quads: moderate tightness bilaterally.   POSTURE: No Significant postural limitations  PALPATION: Tenderness R lumbar lumbar paraspinals, R QL, R SIJ, R glut med, R piriformis.   LUMBAR ROM:  full AROM, discomfort R side all motions, R SB and R rotation worst.   LOWER EXTREMITY ROM:   WNL, noted more hip IR than ER, history of W-sitting in youth  LOWER EXTREMITY MMT:   5/5 LE strength bil, can wall on heels and toes.   Decreased core strength.   LUMBAR SPECIAL TESTS:  Straight leg raise test: Negative and Long sit test: Positive  GAIT: Distance walked:  75' Comments: no device or deviation.   TODAY'S TREATMENT:                                                                                                                              DATE:   03/06/23 Therapeutic Exercise: to improve strength and mobility.  Demo, verbal and tactile cues throughout for technique. Nustep L5 x 6 min  MET - resisted hip extension (isometric single leg bridge on R) 3 rounds 5 x  5 sec holds - negative retest Forward bend with R foot up - no change Forward lung with R leg back - no change Manual Therapy: to decrease muscle spasm and pain and improve mobility IASTM with foam roller to R glutes and QL Self Care: Information on TENS, information on positioning Modalities: TENS + MHP x 10 min to R lower back to decrease muscle spasm and pain  02/20/23 Therapeutic Exercise: to improve strength and mobility.  Demo, verbal and tactile cues throughout for technique. Nustep L5 x 5 min  MET- 2 rounds isometric R hip extension 5 x 5 sec hold - negative retest Isometric hip abduction/adduction 5 sec holds PPT 5 x 10 sec hold PPT with march Bridge with PPT x 10  Prone leg extension x 10  Manual Therapy: to decrease muscle spasm and pain and improve mobility IASTM with foam roller to R glutes and QL   01/16/23 Eval, patient education    PATIENT EDUCATION:  Education details: information on TENS Person educated: Patient Education method: Programmer, multimedia, Facilities manager, Verbal cues, and Handouts Education comprehension: verbalized understanding and returned demonstration  HOME EXERCISE PROGRAM: Access Code: [email or web address] URL: [email or web address] Date: 02/20/2023 Prepared by: Harrie Foreman  Exercises - Supine Posterior Pelvic Tilt  - 1 x daily - 7 x weekly - 3 sets - 10 reps - Supine Bridge  - 1 x daily - 7 x weekly - 3 sets - 10 reps - Supine Hip Adduction Isometric with Ball  - 1 x daily - 7 x weekly - 3 sets - 10 reps  ASSESSMENT:  CLINICAL  IMPRESSION: ZEEVA COURSER reported increased R sided low back pain after prolonged standing last night.  We discussed different modifications to help decrease pain with standing (elevating foot on counter or book, tall stool in kitchen while prepping).  Her supine to long sit test was positive again on the R, corrected again with MET.  Tried to determine direction preference for SIJ exericses but neither the step and bend for posterior rotation or lunge for anterior rotation felt different.  After manual trialed TENS to R SIJ region with MHP, which she felt beneficial so gave information on inexpensive TENS on [employer].  Andree Moro continues to demonstrate potential for improvement and would benefit from continued skilled therapy to address impairments.    OBJECTIVE IMPAIRMENTS: decreased activity tolerance, decreased strength, increased fascial restrictions, increased muscle spasms, and pain.   ACTIVITY LIMITATIONS: carrying, lifting, bending, standing, squatting, sleeping, and locomotion level  PARTICIPATION LIMITATIONS: meal prep, cleaning, laundry, and community activity  PERSONAL FACTORS: Time since onset of injury/illness/exacerbation and 1-2 comorbidities: HTN, Hashimoto's thyroiditis/hypothyroidism, MRSA, GERD, h/o C-section x 2, osteoarthritis  are also affecting patient's functional outcome.   REHAB POTENTIAL: Good  CLINICAL DECISION MAKING: Evolving/moderate complexity  EVALUATION COMPLEXITY: Moderate   GOALS: Goals reviewed with patient? Yes  SHORT TERM GOALS: Target date: 03/06/2023   Patient will be independent with initial HEP.  Baseline:  Goal status: IN PROGRESS 02/20/23- given  2.  Patient will complete FOTO Baseline: not done Goal status: INITIAL   LONG TERM GOALS: Target date: 04/03/2023    Patient will be independent with advanced/ongoing HEP to improve outcomes and carryover.  Baseline:  Goal status: IN PROGRESS  2.  Patient will report 75%  improvement in R sided low back pain/SIJ to improve QOL.  Baseline:  Goal status: IN PROGRESS  3.   Patient will report predicted outcome on lumbar FOTO to demonstrate improved functional ability.  Baseline:  TBD Goal status: IN PROGRESS   4.  Patient will tolerate >30 min of standing and walking to perform household ADLS like cooking and cleaning without increased LBP pain. Baseline: burning pain after 15 min Goal status: IN PROGRESS  5.  Patient will report 6 points improvement on Modified [place]. Baseline: 16/50 Goal status: IN PROGRESS  PLAN:  PT FREQUENCY: 1-2x/week  PT DURATION: 6 weeks  PLANNED INTERVENTIONS: Therapeutic exercises, Therapeutic activity, Neuromuscular re-education, Balance training, Gait training, Patient/Family education, Self Care, Joint mobilization, Joint manipulation, Stair training, Orthotic/Fit training, Dry Needling, Electrical stimulation, Spinal manipulation, Spinal mobilization, Cryotherapy, Moist heat, Taping, Traction, Ultrasound, Manual therapy, and Re-evaluation.  PLAN FOR NEXT SESSION: FOTO, check supine to long sit test and correct, check preference for SIJ exercises, start with NS exercises for core strengtheing, manual therapy and modalities PRN, interested in TrDN   Jena Gauss, PT, DPT 03/06/2023, 12:32 PM   PHYSICAL THERAPY DISCHARGE SUMMARY  Visits from Start of Care: 3  Current functional level related to goals / functional outcomes: See above   Remaining deficits: See above   Education / Equipment: HEP  Plan: Patient requested discharge and did not return after 03/06/23 appointment.     Jena Gauss, PT, DPT 04/16/2023 2:56 PM

## 2023-03-10 ENCOUNTER — Telehealth: Payer: Self-pay | Admitting: Orthopedic Surgery

## 2023-03-10 DIAGNOSIS — M545 Low back pain, unspecified: Secondary | ICD-10-CM

## 2023-03-10 NOTE — Therapy (Deleted)
OUTPATIENT PHYSICAL THERAPY TREATMENT   Patient Name: Lindsay Wade MRN: [id number] DOB:1965-11-30, 57 y.o., female Today's Date: 03/10/2023  END OF SESSION:    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   Dysplastic nevus 02/02/2023   moderate   Essential hypertension 04/15/2017   GERD (gastroesophageal reflux disease)    Hashimoto's thyroiditis    Headache    not lately   Hypothyroidism    MRSA infection    OSA (obstructive sleep apnea)    CPAP   PONV (postoperative nausea and vomiting)    after tonsillectomy   Past Surgical History:  Procedure Laterality Date   BTS     CARPAL TUNNEL RELEASE Left 11/20/2021   Procedure: LEFT CARPAL TUNNEL RELEASE;  Surgeon: Marlyne Beards, MD;  Location: [facility];  Service: Orthopedics;  Laterality: Left;   CESAREAN SECTION     COLONOSCOPY WITH PROPOFOL N/A 03/15/2021   Procedure: COLONOSCOPY WITH PROPOFOL;  Surgeon: Midge Minium, MD;  Location: [facility];  Service: Endoscopy;  Laterality: N/A;   ESOPHAGOGASTRODUODENOSCOPY (EGD) WITH PROPOFOL N/A 03/15/2021   Procedure: ESOPHAGOGASTRODUODENOSCOPY (EGD) and dillation WITH PROPOFOL;  Surgeon: Midge Minium, MD;  Location: [facility];  Service: Endoscopy;  Laterality: N/A;  sleep apnea requests early   ESOPHAGOGASTRODUODENOSCOPY (EGD) WITH PROPOFOL N/A 12/04/2022   Procedure: ESOPHAGOGASTRODUODENOSCOPY (EGD) WITH PROPOFOL;  Surgeon: Midge Minium, MD;  Location: [facility];  Service: Endoscopy;  Laterality: N/A;   TONSILLECTOMY     uterine ablation  2005   Patient Active Problem List   Diagnosis Date Noted   Primary osteoarthritis 12/22/2022   Body mass index (BMI) 40.0-44.9, adult (HCC) 12/22/2022   Carpal tunnel syndrome, left upper limb    Screen for colon cancer    Polyp of sigmoid colon    Gastroesophageal reflux disease with esophagitis without hemorrhage    Joint synovitis  07/03/2020   Obstructive sleep apnea treated with BiPAP 10/06/2017   Occipital neuralgia of right side 06/03/2017   Uncontrolled morning headache 05/26/2017   HTN (hypertension), malignant 05/26/2017   Snoring 05/26/2017   Occipital headache 04/24/2017   Essential hypertension 04/15/2017   Hypothyroid     PCP: Sharlene Dory, DO   REFERRING PROVIDER: London Sheer, MD  REFERRING DIAG: M54.50,G89.29 (ICD-10-CM) - Chronic midline low back pain without sciatica   Rationale for Evaluation and Treatment: Rehabilitation  THERAPY DIAG:  Other low back pain  Muscle weakness (generalized)  ONSET DATE: 23 years ago  SUBJECTIVE:  SUBJECTIVE STATEMENT: Had a bad day yesterday cooking "I can't even make dinner!"     PERTINENT HISTORY:  HTN, Hashimoto's thyroiditis/hypothyroidism, MRSA, GERD, h/o C-section x 2, osteoarthritis  PAIN:  Are you having pain? Yes: NPRS scale: 0/10 Pain location: right side low back  Pain description: constant, like a rock in your shoe, burning at worst Aggravating factors: prolonged sitting, moving, lifting, bending, standing, walking Relieving factors: heating pack, muscle relaxers, bengay  PRECAUTIONS: None  RED FLAGS: None   WEIGHT BEARING RESTRICTIONS: No  FALLS:  Has patient fallen in last 6 months? No  LIVING ENVIRONMENT: Lives with: lives with their family Lives in: House/apartment Stairs: Yes: Internal: 14 steps; on left going up Has following equipment at home: None  OCCUPATION: Energy manager, works from home  PLOF: Independent  PATIENT GOALS: decrease pain  NEXT MD VISIT: 01/20/23 with Dr. Carmelia Roller, follow-up if needed  OBJECTIVE:   DIAGNOSTIC FINDINGS:  11/28/2022 MR lumbar spine Disc levels:   T12-L1: No significant disc  protrusion, foraminal stenosis, or canal stenosis.  L1-L2: No significant disc protrusion, foraminal stenosis, or canal stenosis.  L2-L3: Disc bulging, ligamentum flavum thickening facet arthropathy. Mild canal stenosis. Mild bilateral foraminal stenosis. L3-L4: Mild disc bulging and facet arthropathy without significant canal stenosis. Mild bilateral foraminal stenosis.  L4-L5: Mild disc bulging. Facet arthropathy. No significant canal or foraminal stenosis.  L5-S1: Left eccentric disc bulging with mild left foraminal stenosis. No significant canal or right foraminal stenosis.   IMPRESSION: Mild foraminal stenosis bilaterally at L2-L3 and L3-L4 and on the left at L5-S1. Mild canal stenosis at L2-L3.  PATIENT SURVEYS:  Modified Oswestry 16/50*   *describes pain much worse than reflected on oswestry  COGNITION: Overall cognitive status: Within functional limits for tasks assessed     SENSATION: WFL  MUSCLE LENGTH: Hamstrings: Right 90 deg; Left 90 deg Quads: moderate tightness bilaterally.   POSTURE: No Significant postural limitations  PALPATION: Tenderness R lumbar lumbar paraspinals, R QL, R SIJ, R glut med, R piriformis.   LUMBAR ROM:  full AROM, discomfort R side all motions, R SB and R rotation worst.   LOWER EXTREMITY ROM:   WNL, noted more hip IR than ER, history of W-sitting in youth  LOWER EXTREMITY MMT:   5/5 LE strength bil, can wall on heels and toes.   Decreased core strength.   LUMBAR SPECIAL TESTS:  Straight leg raise test: Negative and Long sit test: Positive  GAIT: Distance walked: 27' Comments: no device or deviation.   TODAY'S TREATMENT:                                                                                                                              DATE:   03/06/23 Therapeutic Exercise: to improve strength and mobility.  Demo, verbal and tactile cues throughout for technique. Nustep L5 x 6 min  MET - resisted hip extension (isometric  single leg bridge on R) 3 rounds 5 x  5 sec holds - negative retest Forward bend with R foot up - no change Forward lung with R leg back - no change Manual Therapy: to decrease muscle spasm and pain and improve mobility IASTM with foam roller to R glutes and QL Self Care: Information on TENS, information on positioning Modalities: TENS + MHP x 10 min to R lower back to decrease muscle spasm and pain  02/20/23 Therapeutic Exercise: to improve strength and mobility.  Demo, verbal and tactile cues throughout for technique. Nustep L5 x 5 min  MET- 2 rounds isometric R hip extension 5 x 5 sec hold - negative retest Isometric hip abduction/adduction 5 sec holds PPT 5 x 10 sec hold PPT with march Bridge with PPT x 10  Prone leg extension x 10  Manual Therapy: to decrease muscle spasm and pain and improve mobility IASTM with foam roller to R glutes and QL   01/16/23 Eval, patient education    PATIENT EDUCATION:  Education details: information on TENS Person educated: Patient Education method: Programmer, multimedia, Facilities manager, Verbal cues, and Handouts Education comprehension: verbalized understanding and returned demonstration  HOME EXERCISE PROGRAM: Access Code: [email or web address] URL: [email or web address] Date: 02/20/2023 Prepared by: Harrie Foreman  Exercises - Supine Posterior Pelvic Tilt  - 1 x daily - 7 x weekly - 3 sets - 10 reps - Supine Bridge  - 1 x daily - 7 x weekly - 3 sets - 10 reps - Supine Hip Adduction Isometric with Ball  - 1 x daily - 7 x weekly - 3 sets - 10 reps  ASSESSMENT:  CLINICAL IMPRESSION: RAJAE SEALEY reported increased R sided low back pain after prolonged standing last night.  We discussed different modifications to help decrease pain with standing (elevating foot on counter or book, tall stool in kitchen while prepping).  Her supine to long sit test was positive again on the R, corrected again with MET.  Tried to determine direction preference  for SIJ exericses but neither the step and bend for posterior rotation or lunge for anterior rotation felt different.  After manual trialed TENS to R SIJ region with MHP, which she felt beneficial so gave information on inexpensive TENS on [employer].  Andree Moro continues to demonstrate potential for improvement and would benefit from continued skilled therapy to address impairments.    OBJECTIVE IMPAIRMENTS: decreased activity tolerance, decreased strength, increased fascial restrictions, increased muscle spasms, and pain.   ACTIVITY LIMITATIONS: carrying, lifting, bending, standing, squatting, sleeping, and locomotion level  PARTICIPATION LIMITATIONS: meal prep, cleaning, laundry, and community activity  PERSONAL FACTORS: Time since onset of injury/illness/exacerbation and 1-2 comorbidities: HTN, Hashimoto's thyroiditis/hypothyroidism, MRSA, GERD, h/o C-section x 2, osteoarthritis  are also affecting patient's functional outcome.   REHAB POTENTIAL: Good  CLINICAL DECISION MAKING: Evolving/moderate complexity  EVALUATION COMPLEXITY: Moderate   GOALS: Goals reviewed with patient? Yes  SHORT TERM GOALS: Target date: 03/06/2023   Patient will be independent with initial HEP.  Baseline:  Goal status: IN PROGRESS 02/20/23- given  2.  Patient will complete FOTO Baseline: not done Goal status: INITIAL   LONG TERM GOALS: Target date: 04/03/2023    Patient will be independent with advanced/ongoing HEP to improve outcomes and carryover.  Baseline:  Goal status: IN PROGRESS  2.  Patient will report 75% improvement in R sided low back pain/SIJ to improve QOL.  Baseline:  Goal status: IN PROGRESS  3.   Patient will report predicted outcome on lumbar FOTO to demonstrate improved functional ability.  Baseline: TBD  Goal status: IN PROGRESS   4.  Patient will tolerate >30 min of standing and walking to perform household ADLS like cooking and cleaning without increased LBP  pain. Baseline: burning pain after 15 min Goal status: IN PROGRESS  5.  Patient will report 6 points improvement on Modified [place]. Baseline: 16/50 Goal status: IN PROGRESS  PLAN:  PT FREQUENCY: 1-2x/week  PT DURATION: 6 weeks  PLANNED INTERVENTIONS: Therapeutic exercises, Therapeutic activity, Neuromuscular re-education, Balance training, Gait training, Patient/Family education, Self Care, Joint mobilization, Joint manipulation, Stair training, Orthotic/Fit training, Dry Needling, Electrical stimulation, Spinal manipulation, Spinal mobilization, Cryotherapy, Moist heat, Taping, Traction, Ultrasound, Manual therapy, and Re-evaluation.  PLAN FOR NEXT SESSION: FOTO, check supine to long sit test and correct, check preference for SIJ exercises, start with NS exercises for core strengtheing, manual therapy and modalities PRN, interested in TrDN   Jena Gauss, PT, DPT 03/10/2023, 10:01 AM

## 2023-03-10 NOTE — Telephone Encounter (Signed)
Pt called requesting for a referral for back injection. Per pt physical therapy did not work and per last visit Dr Christell Constant spoke about back injection. Please send referral and when approved [place] [facility] call pt with appt. Pt phone number is [phone].

## 2023-03-10 NOTE — Addendum Note (Signed)
Addended by: Jena Gauss on: 03/10/2023 10:04 AM   Modules accepted: Orders

## 2023-03-12 NOTE — Telephone Encounter (Signed)
I called and lmom advised that referral was placed and they would call her once they get the auth from her insurance

## 2023-03-13 ENCOUNTER — Encounter: Payer: Commercial Managed Care - PPO | Admitting: Physical Therapy

## 2023-03-16 ENCOUNTER — Ambulatory Visit: Payer: Commercial Managed Care - PPO | Admitting: Family Medicine

## 2023-03-16 ENCOUNTER — Encounter: Payer: Self-pay | Admitting: Family Medicine

## 2023-03-16 ENCOUNTER — Other Ambulatory Visit (HOSPITAL_BASED_OUTPATIENT_CLINIC_OR_DEPARTMENT_OTHER): Payer: Self-pay

## 2023-03-16 VITALS — BP 142/96 | HR 84 | Temp 98.4°F | Ht 66.0 in | Wt 268.5 lb

## 2023-03-16 DIAGNOSIS — E063 Autoimmune thyroiditis: Secondary | ICD-10-CM

## 2023-03-16 DIAGNOSIS — I10 Essential (primary) hypertension: Secondary | ICD-10-CM

## 2023-03-16 DIAGNOSIS — E038 Other specified hypothyroidism: Secondary | ICD-10-CM | POA: Diagnosis not present

## 2023-03-16 DIAGNOSIS — E7800 Pure hypercholesterolemia, unspecified: Secondary | ICD-10-CM | POA: Insufficient documentation

## 2023-03-16 MED ORDER — PRAVASTATIN SODIUM 10 MG PO TABS: 10.0000 mg | ORAL_TABLET | Freq: Every day | ORAL | 1 refills | Status: DC

## 2023-03-16 MED ORDER — CARVEDILOL 12.5 MG PO TABS
12.5000 mg | ORAL_TABLET | Freq: Two times a day (BID) | ORAL | 3 refills | Status: DC
Start: 2023-03-16 — End: 2023-09-01
  Filled 2023-03-16: qty 60, 30d supply, fill #0
  Filled 2023-04-21: qty 60, 30d supply, fill #1
  Filled 2023-06-03: qty 60, 30d supply, fill #2
  Filled 2023-07-22: qty 60, 30d supply, fill #3

## 2023-03-16 NOTE — Patient Instructions (Signed)
Give [place] 2-3 business days to get the results of your labs back.   Keep the diet clean and stay active.  Check your blood pressures 2-3 times per week, alternating the time of day you check it. If it is high, considering waiting 1-2 minutes and rechecking. If it gets higher, your anxiety is likely creeping up and we should avoid rechecking.   I want your blood pressure less than 140 on the top and less than 90 on the bottom consistently. Both goals must be met (ie, 150/70 is too high even though the 70 on the bottom is desirable).   Let us know if you need anything.

## 2023-03-16 NOTE — Progress Notes (Signed)
Chief Complaint  Patient presents with   Follow-up    Blood pressure medication     Subjective Lindsay Wade is a 57 y.o. female who presents for hypertension follow up. She does monitor home blood pressures. Blood pressures ranging from 140-150's/80-90's on average. She is compliant with medication- Norvasc 5 mg/d. Patient has these side effects of medication: Lower extremity swelling She is usually adhering to a healthy diet overall. Current exercise: some walking No CP or SOB.    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   Dysplastic nevus 02/02/2023   moderate   Essential hypertension 04/15/2017   GERD (gastroesophageal reflux disease)    Hashimoto's thyroiditis    Headache    not lately   Hypothyroidism    MRSA infection    OSA (obstructive sleep apnea)    CPAP   PONV (postoperative nausea and vomiting)    after tonsillectomy    Exam BP (!) 142/96 (BP Location: Left Arm, Cuff Size: Large)   Pulse 84   Temp 98.4 F (36.9 C) (Oral)   Ht 5\' 6"  (1.676 m)   Wt 268 lb 8 oz (121.8 kg)   SpO2 99%   BMI 43.34 kg/m  General:  well developed, well nourished, in no apparent distress Heart: RRR, no bruits, 2+ pitting bilateral LE edema tapering at the knees Lungs: clear to auscultation, no accessory muscle use Psych: well oriented with normal range of affect and appropriate judgment/insight  Essential hypertension - Plan: carvedilol (COREG) 12.5 MG tablet  Pure hypercholesterolemia - Plan: Lipid panel, Hepatic function panel  Hypothyroidism due to Hashimoto's thyroiditis - Plan: TSH  Chronic, not controlled.  Due to a globus sensation, she stopped her ACE inhibitor and was recommended not to go on an ARB.  She failed amlodipine due to side effects and lack of efficacy.  She does not want to go on a water pill.  Will start carvedilol 12.5 mg twice daily.  Monitor blood pressure at home.  Counseled on  diet and exercise. She has lots of concern for developing diabetes from a statin.  Will start pravastatin 10 mg daily.  Recheck labs in 2 months. As above. F/u in 1 month to recheck #1. The patient voiced understanding and agreement to the plan.  Jilda Roche [place], DO 03/16/23  4:48 PM

## 2023-03-17 ENCOUNTER — Ambulatory Visit: Payer: Commercial Managed Care - PPO | Admitting: Gastroenterology

## 2023-03-19 ENCOUNTER — Other Ambulatory Visit (HOSPITAL_BASED_OUTPATIENT_CLINIC_OR_DEPARTMENT_OTHER): Payer: Self-pay

## 2023-03-20 ENCOUNTER — Other Ambulatory Visit (HOSPITAL_BASED_OUTPATIENT_CLINIC_OR_DEPARTMENT_OTHER): Payer: Self-pay

## 2023-03-23 ENCOUNTER — Other Ambulatory Visit (HOSPITAL_BASED_OUTPATIENT_CLINIC_OR_DEPARTMENT_OTHER): Payer: Self-pay

## 2023-03-24 ENCOUNTER — Other Ambulatory Visit (HOSPITAL_BASED_OUTPATIENT_CLINIC_OR_DEPARTMENT_OTHER): Payer: Self-pay

## 2023-03-25 ENCOUNTER — Ambulatory Visit: Payer: Commercial Managed Care - PPO | Admitting: Physical Medicine and Rehabilitation

## 2023-03-25 ENCOUNTER — Other Ambulatory Visit: Payer: Self-pay

## 2023-03-25 VITALS — BP 133/83 | HR 64

## 2023-03-25 DIAGNOSIS — M5416 Radiculopathy, lumbar region: Secondary | ICD-10-CM | POA: Diagnosis not present

## 2023-03-25 MED ORDER — METHYLPREDNISOLONE ACETATE 80 MG/ML IJ SUSP
80.0000 mg | Freq: Once | INTRAMUSCULAR | Status: AC
  Administered 2023-03-25: 80 mg

## 2023-03-25 NOTE — Progress Notes (Signed)
Functional Pain Scale - descriptive words and definitions  Immobilizing (10)   Unable to move or talk due to intensity of pain/unable to sleep and unable to use distraction. Severe range order  Average Pain 10  Can't do a lot of standing and walking. Has done physical therapy in the past.   +Driver, -BT, -Dye Allergies.

## 2023-03-25 NOTE — Patient Instructions (Signed)
[facility] Physiatry Discharge Instructions  *At any time if you have questions or concerns they can be answered by calling [phone]  All Patients: You may experience an increase in your symptoms for the first 2 days (it can take 2 days to 2 weeks for the steroid/cortisone to have its maximal effect). You may use ice to the site for the first 24 hours; 20 minutes on and 20 minutes off and may use heat after that time. You may resume and continue your current pain medications. If you need a refill please contact the prescribing physician. You may resume your medications if any were stopped for the procedure. You may shower but no swimming, tub bath or Jacuzzi for 24 hours. Please remove bandage after 4 hours. You may resume light activities as tolerated. If you had Spine Injection, you should not drive for the next 3 hours due to anesthetics used in the procedure. Please have someone drive for you.  *If you have had sedation, Valium, Xanax, or lorazepam: Do not drive or use public transportation for 24 hours, do not operating hazardous machinery or make important personal/business decisions for 24 hours.  POSSIBLE STEROID SIDE EFFECTS: If experienced these should only last for a short period. Change in menstrual flow  Edema in (swelling)  Increased appetite Skin flushing (redness)  Skin rash/acne  Thrush (oral) Vaginitis    Increased sweating  Depression Increased blood glucose levels Cramping and leg/calf  Euphoria (feeling happy)  POSSIBLE PROCEDURE SIDE EFFECTS: Please call our office if concerned. Increased pain Increased numbness/tingling  Headache Nausea/vomiting Hematoma (bruising/bleeding) Edema (swelling at the site) Weakness  Infection (red/drainage at site) Fever greater than 100.52F  *In the event of a headache after epidural steroid injection: Drink plenty of fluids, especially water and try to lay flat when possible. If the headache does not get better after a few days  or as always if concerned please call the office.

## 2023-03-27 ENCOUNTER — Other Ambulatory Visit (HOSPITAL_BASED_OUTPATIENT_CLINIC_OR_DEPARTMENT_OTHER): Payer: Self-pay

## 2023-03-30 NOTE — Procedures (Signed)
Lumbar Epidural Steroid Injection - Interlaminar Approach with Fluoroscopic Guidance  Patient: Lindsay Wade      Date of Birth: 1966-05-06 MRN: [id number] PCP: Sharlene Dory, DO      Visit Date: 03/25/2023   Universal Protocol:     Consent Given By: the patient  Position: PRONE  Additional Comments: Vital signs were monitored before and after the procedure. Patient was prepped and draped in the usual sterile fashion. The correct patient, procedure, and site was verified.   Injection Procedure Details:   Procedure diagnoses: Lumbar radiculopathy [M54.16]   Meds Administered:  Meds ordered this encounter  Medications   methylPREDNISolone acetate (DEPO-MEDROL) injection 80 mg     Laterality: Right  Location/Site:  L2-3  Needle: 3.5 in., 20 ga. Tuohy  Needle Placement: Paramedian epidural  Findings:   -Comments: Excellent flow of contrast into the epidural space.  Procedure Details: Using a paramedian approach from the side mentioned above, the region overlying the inferior lamina was localized under fluoroscopic visualization and the soft tissues overlying this structure were infiltrated with 4 ml. of 1% Lidocaine without Epinephrine. The Tuohy needle was inserted into the epidural space using a paramedian approach.   The epidural space was localized using loss of resistance along with counter oblique bi-planar fluoroscopic views.  After negative aspirate for air, blood, and CSF, a 2 ml. volume of Isovue-250 was injected into the epidural space and the flow of contrast was observed. Radiographs were obtained for documentation purposes.    The injectate was administered into the level noted above.   Additional Comments:  No complications occurred Dressing: 2 x 2 sterile gauze and Band-Aid    Post-procedure details: Patient was observed during the procedure. Post-procedure instructions were reviewed.  Patient left the clinic in stable condition.

## 2023-03-30 NOTE — Progress Notes (Signed)
Lindsay Wade - 57 y.o. female MRN [id number]  Date of birth: 01-Nov-1965  Office Visit Note: Visit Date: 03/25/2023 PCP: Sharlene Dory, DO Referred by: London Sheer, MD  Subjective: Chief Complaint  Patient presents with   Lower Back - Pain    ESI   HPI:  Lindsay Wade is a 57 y.o. female who comes in today at the request of Dr. Willia Craze for planned Right L2-3 Lumbar Interlaminar epidural steroid injection with fluoroscopic guidance.  The patient has failed conservative care including home exercise, medications, time and activity modification.  This injection will be diagnostic and hopefully therapeutic.  Please see requesting physician notes for further details and justification.   ROS Otherwise per HPI.  Assessment & Plan: Visit Diagnoses:    ICD-10-CM   1. Lumbar radiculopathy  M54.16 XR C-ARM NO REPORT    Epidural Steroid injection    methylPREDNISolone acetate (DEPO-MEDROL) injection 80 mg      Plan: No additional findings.   Meds & Orders:  Meds ordered this encounter  Medications   methylPREDNISolone acetate (DEPO-MEDROL) injection 80 mg    Orders Placed This Encounter  Procedures   XR C-ARM NO REPORT   Epidural Steroid injection    Follow-up: No follow-ups on file.   Procedures: No procedures performed  Lumbar Epidural Steroid Injection - Interlaminar Approach with Fluoroscopic Guidance  Patient: Lindsay Wade      Date of Birth: [birth year] MRN: [id number] PCP: Sharlene Dory, DO      Visit Date: 03/25/2023   Universal Protocol:     Consent Given By: the patient  Position: PRONE  Additional Comments: Vital signs were monitored before and after the procedure. Patient was prepped and draped in the usual sterile fashion. The correct patient, procedure, and site was verified.   Injection Procedure Details:   Procedure diagnoses: Lumbar radiculopathy [M54.16]   Meds Administered:  Meds ordered this  encounter  Medications   methylPREDNISolone acetate (DEPO-MEDROL) injection 80 mg     Laterality: Right  Location/Site:  L2-3  Needle: 3.5 in., 20 ga. Tuohy  Needle Placement: Paramedian epidural  Findings:   -Comments: Excellent flow of contrast into the epidural space.  Procedure Details: Using a paramedian approach from the side mentioned above, the region overlying the inferior lamina was localized under fluoroscopic visualization and the soft tissues overlying this structure were infiltrated with 4 ml. of 1% Lidocaine without Epinephrine. The Tuohy needle was inserted into the epidural space using a paramedian approach.   The epidural space was localized using loss of resistance along with counter oblique bi-planar fluoroscopic views.  After negative aspirate for air, blood, and CSF, a 2 ml. volume of Isovue-250 was injected into the epidural space and the flow of contrast was observed. Radiographs were obtained for documentation purposes.    The injectate was administered into the level noted above.   Additional Comments:  No complications occurred Dressing: 2 x 2 sterile gauze and Band-Aid    Post-procedure details: Patient was observed during the procedure. Post-procedure instructions were reviewed.  Patient left the clinic in stable condition.   Clinical History: CLINICAL DATA:  Low back pain, symptoms persist with > 6 wks treatment   EXAM: MRI LUMBAR SPINE WITHOUT CONTRAST   TECHNIQUE: Multiplanar, multisequence MR imaging of the lumbar spine was performed. No intravenous contrast was administered.   COMPARISON:  Lumbar radiographs April 29, 24 without report.   FINDINGS: Segmentation: Standard segmentation is assumed. The inferior-most fully  formed intervertebral disc is labeled L5-S1.   Alignment:  No substantial sagittal subluxation.   Vertebrae:  No fracture, evidence of discitis, or bone lesion.   Conus medullaris and cauda equina: Conus extends  to the L1-L2 level. Conus appears normal.   Paraspinal and other soft tissues: No acute abnormality.  Falls   Disc levels:   T12-L1: No significant disc protrusion, foraminal stenosis, or canal stenosis.   L1-L2: No significant disc protrusion, foraminal stenosis, or canal stenosis.   L2-L3: Disc bulging, ligamentum flavum thickening facet arthropathy. Mild canal stenosis. Mild bilateral foraminal stenosis.   L3-L4: Mild disc bulging and facet arthropathy without significant canal stenosis. Mild bilateral foraminal stenosis.   L4-L5: Mild disc bulging. Facet arthropathy. No significant canal or foraminal stenosis.   L5-S1: Left eccentric disc bulging with mild left foraminal stenosis. No significant canal or right foraminal stenosis.   IMPRESSION: Mild foraminal stenosis bilaterally at L2-L3 and L3-L4 and on the left at L5-S1. Mild canal stenosis at L2-L3.     Electronically Signed   By: Feliberto Harts M.D.   On: 12/04/2022 15:55     Objective:  VS:  HT:    WT:   BMI:     BP:133/83  HR:64bpm  TEMP: ( )  RESP:  Physical Exam Vitals and nursing note reviewed.  Constitutional:      General: She is not in acute distress.    Appearance: Normal appearance. She is obese. She is not ill-appearing.  HENT:     Head: Normocephalic and atraumatic.     Right Ear: External ear normal.     Left Ear: External ear normal.  Eyes:     Extraocular Movements: Extraocular movements intact.  Cardiovascular:     Rate and Rhythm: Normal rate.     Pulses: Normal pulses.  Pulmonary:     Effort: Pulmonary effort is normal. No respiratory distress.  Abdominal:     General: There is no distension.     Palpations: Abdomen is soft.  Musculoskeletal:        General: Tenderness present.     Cervical back: Neck supple.     Right lower leg: No edema.     Left lower leg: No edema.     Comments: Patient has good distal strength with no pain over the greater trochanters.  No clonus or  focal weakness.  Skin:    Findings: No erythema, lesion or rash.  Neurological:     General: No focal deficit present.     Mental Status: She is alert and oriented to person, place, and time.     Sensory: No sensory deficit.     Motor: No weakness or abnormal muscle tone.     Coordination: Coordination normal.  Psychiatric:        Mood and Affect: Mood normal.        Behavior: Behavior normal.      Imaging: No results found.

## 2023-03-31 ENCOUNTER — Encounter: Payer: Self-pay | Admitting: Dermatology

## 2023-03-31 ENCOUNTER — Ambulatory Visit: Payer: Commercial Managed Care - PPO | Admitting: Dermatology

## 2023-03-31 DIAGNOSIS — D229 Melanocytic nevi, unspecified: Secondary | ICD-10-CM

## 2023-03-31 DIAGNOSIS — D239 Other benign neoplasm of skin, unspecified: Secondary | ICD-10-CM

## 2023-03-31 DIAGNOSIS — D225 Melanocytic nevi of trunk: Secondary | ICD-10-CM | POA: Diagnosis not present

## 2023-03-31 DIAGNOSIS — D2272 Melanocytic nevi of left lower limb, including hip: Secondary | ICD-10-CM

## 2023-03-31 NOTE — Patient Instructions (Signed)

## 2023-03-31 NOTE — Progress Notes (Unsigned)
Follow-Up Visit   Subjective  Lindsay Wade is a 57 y.o. female who presents for the following: She is here today for shave excision of biopsy proven moderate severe dysplastic nevus and biopsy proven atypical nevus of left lower leg posterior.    The following portions of the chart were reviewed this encounter and updated as appropriate: medications, allergies, medical history  Review of Systems:  No other skin or systemic complaints except as noted in HPI or Assessment and Plan.  Objective  Well appearing patient in no apparent distress; mood and affect are within normal limits.   A focused examination was performed of the following areas:   Relevant exam findings are noted in the Assessment and Plan.  Left Flank Healing biopsy site  Left Lower Leg - Posterior Healing biopsy site        PATHOLOGY REPORT  Diagnosis 1. Skin , left flank DYSPLASTIC COMPOUND NEVUS WITH MODERATE ATYPIA, DEEP MARGIN INVOLVED 2. Skin , left lower leg - posterior ATYPICAL INTRAEPIDERMAL MELANOCYTIC PROLIFERATION, MARGIN CLOSE, SEE DESCRIPTION   Assessment & Plan     Dysplastic nevus Left Flank  Biopsy proven moderate dysplastic nevus  Skin excision - Left Flank  Lesion length (cm):  0.2 Lesion width (cm):  0.2 Margin per side (cm):  0.5 Total excision diameter (cm):  1.2 Informed consent: discussed and consent obtained   Timeout: patient name, date of birth, surgical site, and procedure verified   Procedure prep:  Patient was prepped and draped in usual sterile fashion Prep type:  Isopropyl alcohol and povidone-iodine Anesthesia: the lesion was anesthetized in a standard fashion   Anesthetic:  1% lidocaine w/ epinephrine 1-100,000 buffered w/ 8.4% NaHCO3 Instrument used comment:  Dermablade Hemostasis achieved with: pressure   Hemostasis achieved with comment:  Electrocautery Outcome: patient tolerated procedure well with no complications   Post-procedure details:  sterile dressing applied and wound care instructions given   Dressing type: bandage and pressure dressing (mupirocin)   Additional details:  Left to heal by secondary intention  Specimen 2 - Surgical pathology Differential Diagnosis: Biopsy proven moderate dysplastic nevus Check Margins: Yes [id number]  Atypical nevus Left Lower Leg - Posterior  Biopsy proven Atypical intradermal melanocytic proliferation  Skin excision - Left Lower Leg - Posterior  Lesion length (cm):  0.4 Lesion width (cm):  0.5 Margin per side (cm):  0.4 Total excision diameter (cm):  1.3 Informed consent: discussed and consent obtained   Timeout: patient name, date of birth, surgical site, and procedure verified   Procedure prep:  Patient was prepped and draped in usual sterile fashion Prep type:  Isopropyl alcohol and povidone-iodine Anesthesia: the lesion was anesthetized in a standard fashion   Anesthetic:  1% lidocaine w/ epinephrine 1-100,000 buffered w/ 8.4% NaHCO3 Instrument used comment:  Dermablade Hemostasis achieved with: pressure   Hemostasis achieved with comment:  Electrocautery Outcome: patient tolerated procedure well with no complications   Post-procedure details: sterile dressing applied and wound care instructions given   Dressing type: bandage and pressure dressing (mupirocin)   Additional details:  Left to heal by secondary intention  Specimen 1 - Surgical pathology Differential Diagnosis: Biopsy proven Atypical intradermal melanocytic proliferation Check Margins: yes [id number]    Return in about 10 months (around 01/28/2024) for TBSE.  I, Joanie Coddington, CMA, am acting as scribe for [employer], DO .   Documentation: I have reviewed the above documentation for accuracy and completeness, and I agree with the above.  Langston Reusing, DO

## 2023-04-02 NOTE — Patient Instructions (Signed)
Please continue using your BiPAP regularly. While your insurance requires that you use BiPAP at least 4 hours each night on 70% of the nights, I recommend, that you not skip any nights and use it throughout the night if you can. Getting used to BiPAP and staying with the treatment long term does take time and patience and discipline. Untreated obstructive sleep apnea when it is moderate to severe can have an adverse impact on cardiovascular health and raise her risk for heart disease, arrhythmias, hypertension, congestive heart failure, stroke and diabetes. Untreated obstructive sleep apnea causes sleep disruption, nonrestorative sleep, and sleep deprivation. This can have an impact on your day to day functioning and cause daytime sleepiness and impairment of cognitive function, memory loss, mood disturbance, and problems focussing. Using BiPAP regularly can improve these symptoms.  We will update supply orders, today. You are eligible for a new machine. I will order a repeat sleep study that you will do at home. Please listen out for a call from our sleep lab staff to schedule. Once you complete study, our sleep doctors with evaluate the data and we will use that to order a new machine as indicated. This process could take a few weeks. Once new machine is ordered, you will hear back from your DME company to schedule set up of your new machine.   We will need to see you back within 61-90 days following set up of your new BiPAP to document compliance as required by your insurance company. Please call the office to schedule follow up when you receive your new machine. Please feel free to reach out with any questions or concerns.

## 2023-04-03 ENCOUNTER — Other Ambulatory Visit (HOSPITAL_BASED_OUTPATIENT_CLINIC_OR_DEPARTMENT_OTHER): Payer: Self-pay

## 2023-04-03 LAB — SURGICAL PATHOLOGY

## 2023-04-06 ENCOUNTER — Other Ambulatory Visit (HOSPITAL_BASED_OUTPATIENT_CLINIC_OR_DEPARTMENT_OTHER): Payer: Self-pay

## 2023-04-06 NOTE — Progress Notes (Signed)
Surgical excision pathology report was reviewed and showed clear margins.  No additional treatment required.

## 2023-04-07 ENCOUNTER — Encounter: Payer: Self-pay | Admitting: Family Medicine

## 2023-04-07 ENCOUNTER — Ambulatory Visit: Payer: Commercial Managed Care - PPO | Admitting: Family Medicine

## 2023-04-07 ENCOUNTER — Other Ambulatory Visit (HOSPITAL_BASED_OUTPATIENT_CLINIC_OR_DEPARTMENT_OTHER): Payer: Self-pay

## 2023-04-07 VITALS — BP 156/91 | HR 64 | Ht 66.0 in | Wt 270.0 lb

## 2023-04-07 DIAGNOSIS — G4733 Obstructive sleep apnea (adult) (pediatric): Secondary | ICD-10-CM

## 2023-04-09 ENCOUNTER — Encounter: Payer: Self-pay | Admitting: Orthopedic Surgery

## 2023-04-09 DIAGNOSIS — M47816 Spondylosis without myelopathy or radiculopathy, lumbar region: Secondary | ICD-10-CM

## 2023-04-10 ENCOUNTER — Other Ambulatory Visit (HOSPITAL_BASED_OUTPATIENT_CLINIC_OR_DEPARTMENT_OTHER): Payer: Self-pay

## 2023-04-13 ENCOUNTER — Other Ambulatory Visit (HOSPITAL_BASED_OUTPATIENT_CLINIC_OR_DEPARTMENT_OTHER): Payer: Self-pay

## 2023-04-13 ENCOUNTER — Encounter: Payer: Self-pay | Admitting: Dermatology

## 2023-04-13 NOTE — Telephone Encounter (Signed)
Please call patient and let her know that the legs in general take a long time to heal.  With that said, the surrounding pink area could be inflammation or signs of early infection.  We are going to prescribe topical mupirocin ointment to be applied BID daily and Doxy 100mg  BID for 7 days with food.  Keeping the wound covered with mupirocin ointment at all times will help prevent that tight feeling.   -Dr. Onalee Hua

## 2023-04-14 ENCOUNTER — Other Ambulatory Visit (HOSPITAL_BASED_OUTPATIENT_CLINIC_OR_DEPARTMENT_OTHER): Payer: Self-pay

## 2023-04-15 ENCOUNTER — Telehealth: Payer: Self-pay

## 2023-04-15 MED ORDER — MUPIROCIN 2 % EX OINT: 1.0000 | TOPICAL_OINTMENT | Freq: Two times a day (BID) | CUTANEOUS | 0 refills | Status: DC

## 2023-04-15 MED ORDER — DOXYCYCLINE HYCLATE 100 MG PO TABS: 100.0000 mg | ORAL_TABLET | Freq: Two times a day (BID) | ORAL | 0 refills | Status: AC

## 2023-04-15 NOTE — Telephone Encounter (Signed)
Patient sent a MyChart message to say that her biopsy site feels tight and is red around it. Advised patient that lower legs are very slow to heal and what she is experiencing is normal. Advised that the redness could be inflammation or early signs of infection so I sent in Mupirocin bid and Doxycycline 100 mg 1 po bid with food and plenty of fluid x 7 days. Advised her that keep area covered with mupirocin and a bandage will help it heal.

## 2023-04-16 ENCOUNTER — Other Ambulatory Visit (HOSPITAL_BASED_OUTPATIENT_CLINIC_OR_DEPARTMENT_OTHER): Payer: Self-pay

## 2023-04-17 ENCOUNTER — Other Ambulatory Visit (HOSPITAL_BASED_OUTPATIENT_CLINIC_OR_DEPARTMENT_OTHER): Payer: Self-pay

## 2023-04-21 ENCOUNTER — Other Ambulatory Visit (HOSPITAL_BASED_OUTPATIENT_CLINIC_OR_DEPARTMENT_OTHER): Payer: Self-pay

## 2023-04-21 ENCOUNTER — Ambulatory Visit: Payer: Commercial Managed Care - PPO | Admitting: Physical Medicine and Rehabilitation

## 2023-04-21 ENCOUNTER — Encounter: Payer: Self-pay | Admitting: Physical Medicine and Rehabilitation

## 2023-04-21 ENCOUNTER — Encounter: Payer: Self-pay | Admitting: Gastroenterology

## 2023-04-21 ENCOUNTER — Other Ambulatory Visit (HOSPITAL_COMMUNITY): Payer: Self-pay

## 2023-04-21 ENCOUNTER — Other Ambulatory Visit: Payer: Self-pay

## 2023-04-21 DIAGNOSIS — M47819 Spondylosis without myelopathy or radiculopathy, site unspecified: Secondary | ICD-10-CM | POA: Diagnosis not present

## 2023-04-21 DIAGNOSIS — G8929 Other chronic pain: Secondary | ICD-10-CM

## 2023-04-21 DIAGNOSIS — M545 Low back pain, unspecified: Secondary | ICD-10-CM

## 2023-04-21 DIAGNOSIS — M47816 Spondylosis without myelopathy or radiculopathy, lumbar region: Secondary | ICD-10-CM | POA: Diagnosis not present

## 2023-04-21 NOTE — Progress Notes (Unsigned)
Lindsay Wade - 57 y.o. female MRN [id number]  Date of birth: July 05, 1966  Office Visit Note: Visit Date: 04/21/2023 PCP: Sharlene Dory, DO Referred by: Sharlene Dory*  Subjective: Chief Complaint  Patient presents with   Lower Back - Pain   HPI: Lindsay Wade is a 57 y.o. female who comes in today per the request of Dr. Willia Craze for evaluation of chronic, worsening and severe right sided lower back pain. Pain ongoing for several years, worsens with standing and activity. Severe pain with household activities such as cleaning and cooking. She describes pain as sore, burning and stabbing sensation, currently rates as 8 out of 10. Some relief of pain with home exercise regimen, heating pad, rest and use of medications. History of formal physical therapy with minimal relief of pain. Recent lumbar MRI imaging exhibits mild foraminal stenosis bilaterally at L2-L3 and L3-L4 and on the left at L5-S1. There is facet arthropathy at L2-L3, L3-L4 and L4-L5. Mild central canal stenosis at L2-L3. Patient recently underwent right L2-L3 interlaminar epidural steroid injection in our office on 03/25/2023, no relief of pain with his procedure. She comes in today to discuss medial branch blocks and possible radiofrequency ablation procedure. Patient denies focal weakness, numbness and tingling. No recent trauma or falls.     Oswestry Disability Index Score 32% 10 to 20 (40%) moderate disability: The patient experiences more pain and difficulty with sitting, lifting and standing. Travel and social life are more difficult, and they may be disabled from work. Personal care, sexual activity and sleeping are not grossly affected, and the patient can usually be managed by conservative means.  Review of Systems  Musculoskeletal:  Positive for back pain.  Neurological:  Negative for tingling, sensory change, focal weakness and weakness.  All other systems reviewed and are negative.   Otherwise per HPI.  Assessment & Plan: Visit Diagnoses:    ICD-10-CM   1. Chronic bilateral low back pain without sciatica  M54.50    G89.29     2. Spondylosis without myelopathy or radiculopathy  M47.819     3. Facet arthropathy, lumbar  M47.816        Plan: Findings:  Chronic, worsening and severe right sided lower back pain.  No radicular symptoms noted.  Patient continues to have severe pain despite good conservative therapies such as formal physical therapy, home exercise regimen, rest and use of medications.  Patient's clinical presentation and exam are consistent with facet mediated pain.  There is facet arthropathy noted at the levels of L2-L3 and L3-L4.  She does have pain with lumbar extension on exam today.  Next step is to perform diagnostic right L2-L3 and L3-L4 medial branch blocks under fluoroscopic guidance. If good relief of pain with diagnostic medial branch blocks we discussed possibility of longer sustained pain relief with this procedure. I discussed ablation procedure in detail today and provided patient with educational material to take home and review. She has no questions at this time. No red flag symptoms noted upon exam today.     Meds & Orders: No orders of the defined types were placed in this encounter.  No orders of the defined types were placed in this encounter.   Follow-up: Return for Right L2-L3 and L3-L4 medial branch blocks.   Procedures: No procedures performed      Clinical History: CLINICAL DATA:  Low back pain, symptoms persist with > 6 wks treatment   EXAM: MRI LUMBAR SPINE WITHOUT CONTRAST  TECHNIQUE: Multiplanar, multisequence MR imaging of the lumbar spine was performed. No intravenous contrast was administered.   COMPARISON:  Lumbar radiographs April 29, 24 without report.   FINDINGS: Segmentation: Standard segmentation is assumed. The inferior-most fully formed intervertebral disc is labeled L5-S1.   Alignment:  No substantial  sagittal subluxation.   Vertebrae:  No fracture, evidence of discitis, or bone lesion.   Conus medullaris and cauda equina: Conus extends to the L1-L2 level. Conus appears normal.   Paraspinal and other soft tissues: No acute abnormality.  Falls   Disc levels:   T12-L1: No significant disc protrusion, foraminal stenosis, or canal stenosis.   L1-L2: No significant disc protrusion, foraminal stenosis, or canal stenosis.   L2-L3: Disc bulging, ligamentum flavum thickening facet arthropathy. Mild canal stenosis. Mild bilateral foraminal stenosis.   L3-L4: Mild disc bulging and facet arthropathy without significant canal stenosis. Mild bilateral foraminal stenosis.   L4-L5: Mild disc bulging. Facet arthropathy. No significant canal or foraminal stenosis.   L5-S1: Left eccentric disc bulging with mild left foraminal stenosis. No significant canal or right foraminal stenosis.   IMPRESSION: Mild foraminal stenosis bilaterally at L2-L3 and L3-L4 and on the left at L5-S1. Mild canal stenosis at L2-L3.     Electronically Signed   By: Feliberto Harts M.D.   On: 12/04/2022 15:55   She reports that she has never smoked. She has never been exposed to tobacco smoke. She has never used smokeless tobacco.  Recent Labs    01/20/23 0802  LABURIC 3.4    Objective:  VS:  HT:    WT:   BMI:     BP:   HR: bpm  TEMP: ( )  RESP:  Physical Exam Vitals and nursing note reviewed.  HENT:     Head: Normocephalic and atraumatic.     Right Ear: External ear normal.     Left Ear: External ear normal.     Nose: Nose normal.     Mouth/Throat:     Mouth: Mucous membranes are moist.  Eyes:     Extraocular Movements: Extraocular movements intact.  Cardiovascular:     Rate and Rhythm: Normal rate.     Pulses: Normal pulses.  Pulmonary:     Effort: Pulmonary effort is normal.  Abdominal:     General: Abdomen is flat. There is no distension.  Musculoskeletal:        General: Tenderness  present.     Cervical back: Normal range of motion.     Comments: Patient rises from seated position to standing without difficulty. Pain noted with facet loading and lumbar extension. 5/5 strength noted with bilateral hip flexion, knee flexion/extension, ankle dorsiflexion/plantarflexion and EHL. No clonus noted bilaterally. No pain upon palpation of greater trochanters. No pain with internal/external rotation of bilateral hips. Sensation intact bilaterally. Negative slump test bilaterally. Ambulates without aid, gait steady.     Skin:    General: Skin is warm and dry.     Capillary Refill: Capillary refill takes less than 2 seconds.  Neurological:     General: No focal deficit present.     Mental Status: She is alert and oriented to person, place, and time.  Psychiatric:        Mood and Affect: Mood normal.        Behavior: Behavior normal.     Ortho Exam  Imaging: No results found.  Past Medical/Family/Surgical/Social History: Medications & Allergies reviewed per EMR, new medications updated. Patient Active Problem List  Diagnosis Date Noted   Pure hypercholesterolemia 03/16/2023   Primary osteoarthritis 12/22/2022   Body mass index (BMI) 40.0-44.9, adult (HCC) 12/22/2022   Carpal tunnel syndrome, left upper limb    Screen for colon cancer    Polyp of sigmoid colon    Gastroesophageal reflux disease with esophagitis without hemorrhage    Joint synovitis 07/03/2020   Obstructive sleep apnea treated with BiPAP 10/06/2017   Occipital neuralgia of right side 06/03/2017   Uncontrolled morning headache 05/26/2017   HTN (hypertension), malignant 05/26/2017   Snoring 05/26/2017   Occipital headache 04/24/2017   Essential hypertension 04/15/2017   Hypothyroid    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 - shave excision 03/31/2023   Dysplastic nevus 02/02/2023   moderate - shave excision 03/31/2023   Essential  hypertension 04/15/2017   GERD (gastroesophageal reflux disease)    Hashimoto's thyroiditis    Headache    not lately   Hypothyroidism    MRSA infection    OSA (obstructive sleep apnea)    CPAP   PONV (postoperative nausea and vomiting)    after tonsillectomy   Family History  Problem Relation Age of Onset   Hypertension Mother    Diabetes Mother        TYPE 2    Hyperlipidemia Mother    Thyroid disease Mother    Hypertension Father    Parkinsonism Father    Hyperlipidemia Father    Stroke Father    Hyperlipidemia Sister    Colon cancer Neg Hx    Stomach cancer Neg Hx    Pancreatic cancer Neg Hx    Past Surgical History:  Procedure Laterality Date   BTS     CARPAL TUNNEL RELEASE Left 11/20/2021   Procedure: LEFT CARPAL TUNNEL RELEASE;  Surgeon: Marlyne Beards, MD;  Location: [facility];  Service: Orthopedics;  Laterality: Left;   CESAREAN SECTION     COLONOSCOPY WITH PROPOFOL N/A 03/15/2021   Procedure: COLONOSCOPY WITH PROPOFOL;  Surgeon: Midge Minium, MD;  Location: [facility];  Service: Endoscopy;  Laterality: N/A;   ESOPHAGOGASTRODUODENOSCOPY (EGD) WITH PROPOFOL N/A 03/15/2021   Procedure: ESOPHAGOGASTRODUODENOSCOPY (EGD) and dillation WITH PROPOFOL;  Surgeon: Midge Minium, MD;  Location: [facility];  Service: Endoscopy;  Laterality: N/A;  sleep apnea requests early   ESOPHAGOGASTRODUODENOSCOPY (EGD) WITH PROPOFOL N/A 12/04/2022   Procedure: ESOPHAGOGASTRODUODENOSCOPY (EGD) WITH PROPOFOL;  Surgeon: Midge Minium, MD;  Location: [facility];  Service: Endoscopy;  Laterality: N/A;   TONSILLECTOMY     uterine ablation  2005   Social History   Occupational History   Occupation: Marine scientist    Employer: [employer]  Tobacco Use   Smoking status: Never    Passive exposure: Never   Smokeless tobacco: Never  Vaping Use   Vaping status: Never Used  Substance and Sexual Activity   Alcohol use: Not Currently     Comment: social drinker; very rare    Drug use: No   Sexual activity: Yes    Partners: Male    Birth control/protection: Surgical

## 2023-04-21 NOTE — Progress Notes (Unsigned)
Functional Pain Scale - descriptive words and definitions  Distracting (5)    Aware of pain/able to complete some ADL's but limited by pain/sleep is affected and active distractions are only slightly useful. Moderate range order  Average Pain  varies  Lower back pain on right side with no radiation in the legs

## 2023-04-22 ENCOUNTER — Other Ambulatory Visit (HOSPITAL_BASED_OUTPATIENT_CLINIC_OR_DEPARTMENT_OTHER): Payer: Self-pay

## 2023-04-24 ENCOUNTER — Other Ambulatory Visit (HOSPITAL_BASED_OUTPATIENT_CLINIC_OR_DEPARTMENT_OTHER): Payer: Self-pay

## 2023-04-24 ENCOUNTER — Telehealth: Payer: Self-pay

## 2023-04-24 NOTE — Telephone Encounter (Signed)
I received a fax from the insurance company following the denial of prior authorization. The insurance company states that Voquenza 10md every day is appropriate for treatment of GERD, not 20 mg, they will not cover the 20 mg.   Please advise if okay to change Rx to Voquenza 10 mg.Marland KitchenMarland KitchenMarland Kitchen

## 2023-04-27 ENCOUNTER — Other Ambulatory Visit (HOSPITAL_BASED_OUTPATIENT_CLINIC_OR_DEPARTMENT_OTHER): Payer: Self-pay

## 2023-04-28 ENCOUNTER — Ambulatory Visit: Payer: Commercial Managed Care - PPO | Admitting: Physical Medicine and Rehabilitation

## 2023-04-28 ENCOUNTER — Other Ambulatory Visit (HOSPITAL_BASED_OUTPATIENT_CLINIC_OR_DEPARTMENT_OTHER): Payer: Self-pay

## 2023-04-28 ENCOUNTER — Other Ambulatory Visit: Payer: Self-pay | Admitting: Gastroenterology

## 2023-04-28 ENCOUNTER — Other Ambulatory Visit: Payer: Self-pay

## 2023-04-28 DIAGNOSIS — M47816 Spondylosis without myelopathy or radiculopathy, lumbar region: Secondary | ICD-10-CM | POA: Diagnosis not present

## 2023-04-28 MED ORDER — VOQUEZNA 10 MG PO TABS
10.0000 mg | ORAL_TABLET | Freq: Every day | ORAL | 6 refills | Status: DC
Start: 2023-04-28 — End: 2024-05-13
  Filled 2023-04-28 – 2023-06-03 (×3): qty 30, 30d supply, fill #0
  Filled 2023-07-22: qty 30, 30d supply, fill #1
  Filled 2023-07-24: qty 30, 30d supply, fill #0
  Filled 2023-10-19 – 2023-11-06 (×2): qty 30, 30d supply, fill #1
  Filled 2023-12-14 – 2023-12-25 (×2): qty 30, 30d supply, fill #2
  Filled 2024-01-19 – 2024-02-10 (×3): qty 30, 30d supply, fill #3
  Filled 2024-03-31: qty 30, 30d supply, fill #4

## 2023-04-28 MED ORDER — BUPIVACAINE HCL 0.5 % IJ SOLN
3.0000 mL | Freq: Once | INTRAMUSCULAR | Status: AC
  Administered 2023-04-28: 3 mL

## 2023-04-28 NOTE — Progress Notes (Signed)
Functional Pain Scale - descriptive words and definitions  Immobilizing (10)   Unable to move or talk due to intensity of pain/unable to sleep and unable to use distraction. Severe range order  Average Pain 7 153/98 No numbness or tingling down the legs  +Driver, -BT, -Dye Allergies.

## 2023-04-28 NOTE — Patient Instructions (Signed)

## 2023-04-28 NOTE — Addendum Note (Signed)
Addended by: Roena Malady on: 04/28/2023 10:01 AM   Modules accepted: Orders

## 2023-04-28 NOTE — Telephone Encounter (Signed)
20 mg discontinued and 10 mg dose sent to the pharmacy

## 2023-04-29 ENCOUNTER — Encounter: Payer: Self-pay | Admitting: Physical Medicine and Rehabilitation

## 2023-04-29 ENCOUNTER — Other Ambulatory Visit: Payer: Self-pay | Admitting: Physical Medicine and Rehabilitation

## 2023-04-29 DIAGNOSIS — M47816 Spondylosis without myelopathy or radiculopathy, lumbar region: Secondary | ICD-10-CM

## 2023-04-29 DIAGNOSIS — G8929 Other chronic pain: Secondary | ICD-10-CM

## 2023-04-29 NOTE — Progress Notes (Signed)
Greater than 75% relief of pain with right L2-L3 and L3-L4 medial branch blocks per pain diary. I will place second set of diagnostic medial branch blocks. We will call her to schedule after insurance approval.

## 2023-04-30 ENCOUNTER — Telehealth: Payer: Self-pay | Admitting: Family Medicine

## 2023-04-30 NOTE — Telephone Encounter (Signed)
04/16/23 - mailbox full - ag  04/07/23 [facility] [employer] no auth req EE

## 2023-05-04 ENCOUNTER — Telehealth: Payer: Self-pay | Admitting: Physical Medicine and Rehabilitation

## 2023-05-04 NOTE — Telephone Encounter (Signed)
Spoke with patient and scheduled injection for 05/13/23. Patient aware driver needed

## 2023-05-04 NOTE — Telephone Encounter (Signed)
Patient called returning your call. FA#[phone]

## 2023-05-06 DIAGNOSIS — H524 Presbyopia: Secondary | ICD-10-CM | POA: Diagnosis not present

## 2023-05-06 NOTE — Progress Notes (Signed)
YONNA DIPPOLD - 57 y.o. female MRN [id number]  Date of birth: [birth year]  Office Visit Note: Visit Date: 04/28/2023 PCP: Sharlene Dory, DO Referred by: Sharlene Dory*  Subjective: Chief Complaint  Patient presents with   Lower Back - Pain   HPI:  COHEN LEGARDA is a 57 y.o. female who comes in today at the request of Ellin Goodie, FNP for planned Bilateral  L2-3 and L3-4 Lumbar facet/medial branch block with fluoroscopic guidance.  The patient has failed conservative care including home exercise, medications, time and activity modification.  This injection will be diagnostic and hopefully therapeutic.  Please see requesting physician notes for further details and justification.  Exam has shown concordant pain with facet joint loading. Her pain is predominantly right sided but sometimes on left.   ROS Otherwise per HPI.  Assessment & Plan: Visit Diagnoses:    ICD-10-CM   1. Spondylosis without myelopathy or radiculopathy, lumbar region  M47.816 XR C-ARM NO REPORT    Facet Injection    bupivacaine (MARCAINE) 0.5 % (with pres) injection 3 mL      Plan: No additional findings.   Meds & Orders:  Meds ordered this encounter  Medications   bupivacaine (MARCAINE) 0.5 % (with pres) injection 3 mL    Orders Placed This Encounter  Procedures   Facet Injection   XR C-ARM NO REPORT    Follow-up: Return for Review Pain Diary.   Procedures: No procedures performed  Lumbar Diagnostic Facet Joint Nerve Block with Fluoroscopic Guidance   Patient: BERTIE BURCHILL      Date of Birth: 18-Sep-1965 MRN: [id number] PCP: Sharlene Dory, DO      Visit Date: 04/28/2023   Universal Protocol:    Date/Time: 10/30/247:20 AM  Consent Given By: the patient  Position: PRONE  Additional Comments: Vital signs were monitored before and after the procedure. Patient was prepped and draped in the usual sterile fashion. The correct patient, procedure, and  site was verified.   Injection Procedure Details:   Procedure diagnoses:  1. Spondylosis without myelopathy or radiculopathy, lumbar region      Meds Administered:  Meds ordered this encounter  Medications   bupivacaine (MARCAINE) 0.5 % (with pres) injection 3 mL     Laterality: Bilateral  Location/Site: L2-L3, L1 and L2 medial branches and L3-L4, L2 and L3 medial branches  Needle: 5.0 in., 25 ga.  Short bevel or Quincke spinal needle  Needle Placement: Oblique pedical  Findings:   -Comments: There was excellent flow of contrast along the articular pillars without intravascular flow.  Procedure Details: The fluoroscope beam is vertically oriented in AP and then obliqued 15 to 20 degrees to the ipsilateral side of the desired nerve to achieve the "Scotty dog" appearance.  The skin over the target area of the junction of the superior articulating process and the transverse process (sacral ala if blocking the L5 dorsal rami) was locally anesthetized with a 1 ml volume of 1% Lidocaine without Epinephrine.  The spinal needle was inserted and advanced in a trajectory view down to the target.   After contact with periosteum and negative aspirate for blood and CSF, correct placement without intravascular or epidural spread was confirmed by injecting 0.5 ml. of Isovue-250.  A spot radiograph was obtained of this image.    Next, a 0.5 ml. volume of the injectate described above was injected. The needle was then redirected to the other facet joint nerves mentioned above if needed.  Prior  to the procedure, the patient was given a Pain Diary which was completed for baseline measurements.  After the procedure, the patient rated their pain every 30 minutes and will continue rating at this frequency for a total of 5 hours.  The patient has been asked to complete the Diary and return to [place] by mail, fax or hand delivered as soon as possible.   Additional Comments:  The patient tolerated the  procedure well Dressing: 2 x 2 sterile gauze and Band-Aid    Post-procedure details: Patient was observed during the procedure. Post-procedure instructions were reviewed.  Patient left the clinic in stable condition.   Clinical History: CLINICAL DATA:  Low back pain, symptoms persist with > 6 wks treatment   EXAM: MRI LUMBAR SPINE WITHOUT CONTRAST   TECHNIQUE: Multiplanar, multisequence MR imaging of the lumbar spine was performed. No intravenous contrast was administered.   COMPARISON:  Lumbar radiographs April 29, 24 without report.   FINDINGS: Segmentation: Standard segmentation is assumed. The inferior-most fully formed intervertebral disc is labeled L5-S1.   Alignment:  No substantial sagittal subluxation.   Vertebrae:  No fracture, evidence of discitis, or bone lesion.   Conus medullaris and cauda equina: Conus extends to the L1-L2 level. Conus appears normal.   Paraspinal and other soft tissues: No acute abnormality.  Falls   Disc levels:   T12-L1: No significant disc protrusion, foraminal stenosis, or canal stenosis.   L1-L2: No significant disc protrusion, foraminal stenosis, or canal stenosis.   L2-L3: Disc bulging, ligamentum flavum thickening facet arthropathy. Mild canal stenosis. Mild bilateral foraminal stenosis.   L3-L4: Mild disc bulging and facet arthropathy without significant canal stenosis. Mild bilateral foraminal stenosis.   L4-L5: Mild disc bulging. Facet arthropathy. No significant canal or foraminal stenosis.   L5-S1: Left eccentric disc bulging with mild left foraminal stenosis. No significant canal or right foraminal stenosis.   IMPRESSION: Mild foraminal stenosis bilaterally at L2-L3 and L3-L4 and on the left at L5-S1. Mild canal stenosis at L2-L3.     Electronically Signed   By: Feliberto Harts M.D.   On: 12/04/2022 15:55     Objective:  VS:  HT:    WT:   BMI:     BP:   HR: bpm  TEMP: ( )  RESP:  Physical  Exam Vitals and nursing note reviewed.  Constitutional:      General: She is not in acute distress.    Appearance: Normal appearance. She is obese. She is not ill-appearing.  HENT:     Head: Normocephalic and atraumatic.     Right Ear: External ear normal.     Left Ear: External ear normal.  Eyes:     Extraocular Movements: Extraocular movements intact.  Cardiovascular:     Rate and Rhythm: Normal rate.     Pulses: Normal pulses.  Pulmonary:     Effort: Pulmonary effort is normal. No respiratory distress.  Abdominal:     General: There is no distension.     Palpations: Abdomen is soft.  Musculoskeletal:        General: Tenderness present.     Cervical back: Neck supple.     Right lower leg: No edema.     Left lower leg: No edema.     Comments: Patient has good distal strength with no pain over the greater trochanters.  No clonus or focal weakness.  Skin:    Findings: No erythema, lesion or rash.  Neurological:     General: No focal  deficit present.     Mental Status: She is alert and oriented to person, place, and time.     Sensory: No sensory deficit.     Motor: No weakness or abnormal muscle tone.     Coordination: Coordination normal.  Psychiatric:        Mood and Affect: Mood normal.        Behavior: Behavior normal.      Imaging: No results found.

## 2023-05-06 NOTE — Procedures (Signed)
Lumbar Diagnostic Facet Joint Nerve Block with Fluoroscopic Guidance   Patient: Lindsay Wade      Date of Birth: Dec 29, 1965 MRN: [id number] PCP: Sharlene Dory, DO      Visit Date: 04/28/2023   Universal Protocol:    Date/Time: 10/30/247:20 AM  Consent Given By: the patient  Position: PRONE  Additional Comments: Vital signs were monitored before and after the procedure. Patient was prepped and draped in the usual sterile fashion. The correct patient, procedure, and site was verified.   Injection Procedure Details:   Procedure diagnoses:  1. Spondylosis without myelopathy or radiculopathy, lumbar region      Meds Administered:  Meds ordered this encounter  Medications   bupivacaine (MARCAINE) 0.5 % (with pres) injection 3 mL     Laterality: Bilateral  Location/Site: L2-L3, L1 and L2 medial branches and L3-L4, L2 and L3 medial branches  Needle: 5.0 in., 25 ga.  Short bevel or Quincke spinal needle  Needle Placement: Oblique pedical  Findings:   -Comments: There was excellent flow of contrast along the articular pillars without intravascular flow.  Procedure Details: The fluoroscope beam is vertically oriented in AP and then obliqued 15 to 20 degrees to the ipsilateral side of the desired nerve to achieve the "Scotty dog" appearance.  The skin over the target area of the junction of the superior articulating process and the transverse process (sacral ala if blocking the L5 dorsal rami) was locally anesthetized with a 1 ml volume of 1% Lidocaine without Epinephrine.  The spinal needle was inserted and advanced in a trajectory view down to the target.   After contact with periosteum and negative aspirate for blood and CSF, correct placement without intravascular or epidural spread was confirmed by injecting 0.5 ml. of Isovue-250.  A spot radiograph was obtained of this image.    Next, a 0.5 ml. volume of the injectate described above was injected. The needle  was then redirected to the other facet joint nerves mentioned above if needed.  Prior to the procedure, the patient was given a Pain Diary which was completed for baseline measurements.  After the procedure, the patient rated their pain every 30 minutes and will continue rating at this frequency for a total of 5 hours.  The patient has been asked to complete the Diary and return to [place] by mail, fax or hand delivered as soon as possible.   Additional Comments:  The patient tolerated the procedure well Dressing: 2 x 2 sterile gauze and Band-Aid    Post-procedure details: Patient was observed during the procedure. Post-procedure instructions were reviewed.  Patient left the clinic in stable condition.

## 2023-05-13 ENCOUNTER — Ambulatory Visit: Payer: Commercial Managed Care - PPO | Admitting: Physical Medicine and Rehabilitation

## 2023-05-13 ENCOUNTER — Other Ambulatory Visit: Payer: Self-pay

## 2023-05-13 DIAGNOSIS — M47816 Spondylosis without myelopathy or radiculopathy, lumbar region: Secondary | ICD-10-CM | POA: Diagnosis not present

## 2023-05-13 MED ORDER — METHYLPREDNISOLONE ACETATE 40 MG/ML IJ SUSP: 40.0000 mg | Freq: Once | INTRAMUSCULAR | Status: DC

## 2023-05-13 NOTE — Progress Notes (Signed)
Functional Pain Scale - descriptive words and definitions  Intense (8)    Cannot complete any ADLs without much assistance/cannot concentrate/conversation is difficult/unable to sleep and unable to use distraction. Severe range order  Average Pain 8   +Driver, -BT, -Dye Allergies.

## 2023-05-13 NOTE — Patient Instructions (Signed)

## 2023-05-13 NOTE — Progress Notes (Signed)
[name] - 57 y.o. female MRN [id number]  Date of birth: 1965-07-29  Office Visit Note: Visit Date: 05/13/2023 PCP: Sharlene Dory, DO Referred by: Sharlene Dory*  Subjective: Chief Complaint  Patient presents with   Lower Back - Pain   HPI:  Lindsay Wade is a 57 y.o. female who comes in today for planned repeat Bilateral L2-3 and L3-4 Lumbar facet/medial branch block with fluoroscopic guidance.  The patient has failed conservative care including home exercise, medications, time and activity modification.  This injection will be diagnostic and hopefully therapeutic.  Please see requesting physician notes for further details and justification.  Exam shows concordant low back pain with facet joint loading and extension. Patient received more than 80% pain relief from prior injection. This would be the second block in a diagnostic double block paradigm.     Referring:Megan Mayford Knife, FNP   ROS Otherwise per HPI.  Assessment & Plan: Visit Diagnoses:    ICD-10-CM   1. Spondylosis without myelopathy or radiculopathy, lumbar region  M47.816 XR C-ARM NO REPORT    Facet Injection    DISCONTINUED: methylPREDNISolone acetate (DEPO-MEDROL) injection 40 mg      Plan: No additional findings.   Meds & Orders:  Meds ordered this encounter  Medications   DISCONTD: methylPREDNISolone acetate (DEPO-MEDROL) injection 40 mg    Orders Placed This Encounter  Procedures   Facet Injection   XR C-ARM NO REPORT    Follow-up: Return for Review Pain Diary.   Procedures: No procedures performed  Lumbar Diagnostic Facet Joint Nerve Block with Fluoroscopic Guidance   Patient: Lindsay Wade      Date of Birth: 1966/03/21 MRN: [id number] PCP: Sharlene Dory, DO      Visit Date: 05/13/2023   Universal Protocol:    Date/Time: 11/06/241:01 PM  Consent Given By: the patient  Position: PRONE  Additional Comments: Vital signs were monitored before  and after the procedure. Patient was prepped and draped in the usual sterile fashion. The correct patient, procedure, and site was verified.   Injection Procedure Details:   Procedure diagnoses:  1. Spondylosis without myelopathy or radiculopathy, lumbar region      Meds Administered:  Meds ordered this encounter  Medications   methylPREDNISolone acetate (DEPO-MEDROL) injection 40 mg     Laterality: Bilateral  Location/Site: L2-L3, L1 and L2 medial branches and L3-L4, L2 and L3 medial branches  Needle: 5.0 in., 25 ga.  Short bevel or Quincke spinal needle  Needle Placement: Oblique pedical  Findings:   -Comments: There was excellent flow of contrast along the articular pillars without intravascular flow.  Procedure Details: The fluoroscope beam is vertically oriented in AP and then obliqued 15 to 20 degrees to the ipsilateral side of the desired nerve to achieve the "Scotty dog" appearance.  The skin over the target area of the junction of the superior articulating process and the transverse process (sacral ala if blocking the L5 dorsal rami) was locally anesthetized with a 1 ml volume of 1% Lidocaine without Epinephrine.  The spinal needle was inserted and advanced in a trajectory view down to the target.   After contact with periosteum and negative aspirate for blood and CSF, correct placement without intravascular or epidural spread was confirmed by injecting 0.5 ml. of Isovue-250.  A spot radiograph was obtained of this image.    Next, a 0.5 ml. volume of the injectate described above was injected. The needle was then redirected to the other facet  joint nerves mentioned above if needed.  Prior to the procedure, the patient was given a Pain Diary which was completed for baseline measurements.  After the procedure, the patient rated their pain every 30 minutes and will continue rating at this frequency for a total of 5 hours.  The patient has been asked to complete the Diary and  return to [place] by mail, fax or hand delivered as soon as possible.   Additional Comments:  No complications occurred Dressing: 2 x 2 sterile gauze and Band-Aid    Post-procedure details: Patient was observed during the procedure. Post-procedure instructions were reviewed.  Patient left the clinic in stable condition.   Clinical History: CLINICAL DATA:  Low back pain, symptoms persist with > 6 wks treatment   EXAM: MRI LUMBAR SPINE WITHOUT CONTRAST   TECHNIQUE: Multiplanar, multisequence MR imaging of the lumbar spine was performed. No intravenous contrast was administered.   COMPARISON:  Lumbar radiographs April 29, 24 without report.   FINDINGS: Segmentation: Standard segmentation is assumed. The inferior-most fully formed intervertebral disc is labeled L5-S1.   Alignment:  No substantial sagittal subluxation.   Vertebrae:  No fracture, evidence of discitis, or bone lesion.   Conus medullaris and cauda equina: Conus extends to the L1-L2 level. Conus appears normal.   Paraspinal and other soft tissues: No acute abnormality.  Falls   Disc levels:   T12-L1: No significant disc protrusion, foraminal stenosis, or canal stenosis.   L1-L2: No significant disc protrusion, foraminal stenosis, or canal stenosis.   L2-L3: Disc bulging, ligamentum flavum thickening facet arthropathy. Mild canal stenosis. Mild bilateral foraminal stenosis.   L3-L4: Mild disc bulging and facet arthropathy without significant canal stenosis. Mild bilateral foraminal stenosis.   L4-L5: Mild disc bulging. Facet arthropathy. No significant canal or foraminal stenosis.   L5-S1: Left eccentric disc bulging with mild left foraminal stenosis. No significant canal or right foraminal stenosis.   IMPRESSION: Mild foraminal stenosis bilaterally at L2-L3 and L3-L4 and on the left at L5-S1. Mild canal stenosis at L2-L3.     Electronically Signed   By: Feliberto Harts M.D.   On: 12/04/2022  15:55     Objective:  VS:  HT:    WT:   BMI:     BP:   HR: bpm  TEMP: ( )  RESP:  Physical Exam Vitals and nursing note reviewed.  Constitutional:      General: She is not in acute distress.    Appearance: Normal appearance. She is obese. She is not ill-appearing.  HENT:     Head: Normocephalic and atraumatic.     Right Ear: External ear normal.     Left Ear: External ear normal.  Eyes:     Extraocular Movements: Extraocular movements intact.  Cardiovascular:     Rate and Rhythm: Normal rate.     Pulses: Normal pulses.  Pulmonary:     Effort: Pulmonary effort is normal. No respiratory distress.  Abdominal:     General: There is no distension.     Palpations: Abdomen is soft.  Musculoskeletal:        General: Tenderness present.     Cervical back: Neck supple.     Right lower leg: No edema.     Left lower leg: No edema.     Comments: Patient has good distal strength with no pain over the greater trochanters.  No clonus or focal weakness.  Skin:    Findings: No erythema, lesion or rash.  Neurological:  General: No focal deficit present.     Mental Status: She is alert and oriented to person, place, and time.     Sensory: No sensory deficit.     Motor: No weakness or abnormal muscle tone.     Coordination: Coordination normal.  Psychiatric:        Mood and Affect: Mood normal.        Behavior: Behavior normal.      Imaging: No results found.

## 2023-05-13 NOTE — Procedures (Signed)
Lumbar Diagnostic Facet Joint Nerve Block with Fluoroscopic Guidance   Patient: Lindsay Wade      Date of Birth: 1966-04-24 MRN: [id number] PCP: Sharlene Dory, DO      Visit Date: 05/13/2023   Universal Protocol:    Date/Time: 11/06/241:01 PM  Consent Given By: the patient  Position: PRONE  Additional Comments: Vital signs were monitored before and after the procedure. Patient was prepped and draped in the usual sterile fashion. The correct patient, procedure, and site was verified.   Injection Procedure Details:   Procedure diagnoses:  1. Spondylosis without myelopathy or radiculopathy, lumbar region      Meds Administered:  Meds ordered this encounter  Medications   methylPREDNISolone acetate (DEPO-MEDROL) injection 40 mg     Laterality: Bilateral  Location/Site: L2-L3, L1 and L2 medial branches and L3-L4, L2 and L3 medial branches  Needle: 5.0 in., 25 ga.  Short bevel or Quincke spinal needle  Needle Placement: Oblique pedical  Findings:   -Comments: There was excellent flow of contrast along the articular pillars without intravascular flow.  Procedure Details: The fluoroscope beam is vertically oriented in AP and then obliqued 15 to 20 degrees to the ipsilateral side of the desired nerve to achieve the "Scotty dog" appearance.  The skin over the target area of the junction of the superior articulating process and the transverse process (sacral ala if blocking the L5 dorsal rami) was locally anesthetized with a 1 ml volume of 1% Lidocaine without Epinephrine.  The spinal needle was inserted and advanced in a trajectory view down to the target.   After contact with periosteum and negative aspirate for blood and CSF, correct placement without intravascular or epidural spread was confirmed by injecting 0.5 ml. of Isovue-250.  A spot radiograph was obtained of this image.    Next, a 0.5 ml. volume of the injectate described above was injected. The needle  was then redirected to the other facet joint nerves mentioned above if needed.  Prior to the procedure, the patient was given a Pain Diary which was completed for baseline measurements.  After the procedure, the patient rated their pain every 30 minutes and will continue rating at this frequency for a total of 5 hours.  The patient has been asked to complete the Diary and return to [place] by mail, fax or hand delivered as soon as possible.   Additional Comments:  No complications occurred Dressing: 2 x 2 sterile gauze and Band-Aid    Post-procedure details: Patient was observed during the procedure. Post-procedure instructions were reviewed.  Patient left the clinic in stable condition.

## 2023-05-14 ENCOUNTER — Encounter: Payer: Self-pay | Admitting: Physical Medicine and Rehabilitation

## 2023-05-20 ENCOUNTER — Ambulatory Visit: Payer: Commercial Managed Care - PPO | Admitting: Physical Medicine and Rehabilitation

## 2023-05-20 ENCOUNTER — Other Ambulatory Visit (HOSPITAL_BASED_OUTPATIENT_CLINIC_OR_DEPARTMENT_OTHER): Payer: Self-pay

## 2023-05-20 DIAGNOSIS — M47816 Spondylosis without myelopathy or radiculopathy, lumbar region: Secondary | ICD-10-CM

## 2023-05-20 DIAGNOSIS — M545 Low back pain, unspecified: Secondary | ICD-10-CM

## 2023-05-20 DIAGNOSIS — G8929 Other chronic pain: Secondary | ICD-10-CM | POA: Diagnosis not present

## 2023-05-20 MED ORDER — TRIAZOLAM 0.25 MG PO TABS: ORAL_TABLET | ORAL | 0 refills | Status: DC

## 2023-05-20 NOTE — Progress Notes (Signed)
Lindsay Wade - 57 y.o. female MRN [id number]  Date of birth: 08-28-1965  Office Visit Note: Visit Date: 05/20/2023 PCP: Sharlene Dory, DO Referred by: Sharlene Dory*  Subjective: Chief Complaint  Patient presents with   Lower Back - Pain   HPI: Lindsay Wade is a 57 y.o. female who comes in today for evaluation chronic, worsening and severe bilateral lower back pain, right greater than left. Patient is here today in follow up post diagnostic medial branch blocks. Pain ongoing for several years, worsens with standing and activity. Her pain worsens with movement and activity, severe with household chores such as sweeping, mopping and washing dishes. She describes pain as sore, aching and burning sensation, currently rates as 7 out of 10. Some relief of pain with home exercise regimen, heating pad, rest and use of medications. Minimal relief of pain with recent course of formal physical therapy. Recent lumbar MRI imaging exhibits mild foraminal stenosis bilaterally at L2-L3 and L3-L4 and on the left at L5-S1. There is facet arthropathy at L2-L3, L3-L4 and L4-L5. Mild central canal stenosis at L2-L3. She recently underwent (2) sets of bilateral L2-L3 and L3-L4 diagnostic medial branch blocks, she reports greater than 90% relief of pain with these injections. Also reports increased functional ability post injection. Patient denies focal weakness, numbness and tingling. No recent trauma or falls.      Review of Systems  Musculoskeletal:  Positive for back pain.  Neurological:  Negative for tingling, sensory change, focal weakness and weakness.  All other systems reviewed and are negative.  Otherwise per HPI.  Assessment & Plan: Visit Diagnoses:    ICD-10-CM   1. Chronic bilateral low back pain without sciatica  M54.50 Ambulatory referral to Physical Medicine Rehab   G89.29     2. Spondylosis without myelopathy or radiculopathy, lumbar region  M47.816 Ambulatory  referral to Physical Medicine Rehab    3. Facet arthropathy, lumbar  M47.816 Ambulatory referral to Physical Medicine Rehab       Plan: Findings:  Chronic, worsening and severe bilateral lower back pain, right greater than left. Patient continues to have severe pain despite good conservative therapies such as formal physical therapy, home exercise regimen, rest and use of medications. Patients clinical presentation and exam are consistent with facet mediated pain. There is facet arthropathy at L2-L3, L3-L4 and L4-L5. Significant, greater than 90% relief of pain with diagnostic medial branch blocks. Next step is to perform bilateral L2-L3 and L3-L4 radiofrequency ablation under fluoroscopic guidance. I did prescribe pre-procedure sedation with Halcion for her to take on day of ablation procedure. Patient has no questions regarding ablation procedure. No red flag symptoms noted upon exam today.     Meds & Orders:  Meds ordered this encounter  Medications   triazolam (HALCION) 0.25 MG tablet    Sig: Take one tablet (0.25 mg) by mouth with food one hour prior to procedure. May repeat 30 minutes prior if needed.    Dispense:  2 tablet    Refill:  0    Orders Placed This Encounter  Procedures   Ambulatory referral to Physical Medicine Rehab    Follow-up: No follow-ups on file.   Procedures: No procedures performed      Clinical History: CLINICAL DATA:  Low back pain, symptoms persist with > 6 wks treatment   EXAM: MRI LUMBAR SPINE WITHOUT CONTRAST   TECHNIQUE: Multiplanar, multisequence MR imaging of the lumbar spine was performed. No intravenous contrast was administered.  COMPARISON:  Lumbar radiographs April 29, 24 without report.   FINDINGS: Segmentation: Standard segmentation is assumed. The inferior-most fully formed intervertebral disc is labeled L5-S1.   Alignment:  No substantial sagittal subluxation.   Vertebrae:  No fracture, evidence of discitis, or bone lesion.    Conus medullaris and cauda equina: Conus extends to the L1-L2 level. Conus appears normal.   Paraspinal and other soft tissues: No acute abnormality.  Falls   Disc levels:   T12-L1: No significant disc protrusion, foraminal stenosis, or canal stenosis.   L1-L2: No significant disc protrusion, foraminal stenosis, or canal stenosis.   L2-L3: Disc bulging, ligamentum flavum thickening facet arthropathy. Mild canal stenosis. Mild bilateral foraminal stenosis.   L3-L4: Mild disc bulging and facet arthropathy without significant canal stenosis. Mild bilateral foraminal stenosis.   L4-L5: Mild disc bulging. Facet arthropathy. No significant canal or foraminal stenosis.   L5-S1: Left eccentric disc bulging with mild left foraminal stenosis. No significant canal or right foraminal stenosis.   IMPRESSION: Mild foraminal stenosis bilaterally at L2-L3 and L3-L4 and on the left at L5-S1. Mild canal stenosis at L2-L3.     Electronically Signed   By: Feliberto Harts M.D.   On: 12/04/2022 15:55   She reports that she has never smoked. She has never been exposed to tobacco smoke. She has never used smokeless tobacco.  Recent Labs    01/20/23 0802  LABURIC 3.4    Objective:  VS:  HT:    WT:   BMI:     BP:   HR: bpm  TEMP: ( )  RESP:  Physical Exam Vitals and nursing note reviewed.  HENT:     Head: Normocephalic and atraumatic.     Right Ear: External ear normal.     Left Ear: External ear normal.     Nose: Nose normal.     Mouth/Throat:     Mouth: Mucous membranes are moist.  Eyes:     Extraocular Movements: Extraocular movements intact.  Cardiovascular:     Rate and Rhythm: Normal rate.  Pulmonary:     Effort: Pulmonary effort is normal.  Abdominal:     General: Abdomen is flat. There is no distension.  Musculoskeletal:        General: Tenderness present.     Cervical back: Normal range of motion.     Comments: Patient rises from seated position to standing  without difficulty. Pain noted with facet loading and lumbar extension. 5/5 strength noted with bilateral hip flexion, knee flexion/extension, ankle dorsiflexion/plantarflexion and EHL. No clonus noted bilaterally. No pain upon palpation of greater trochanters. No pain with internal/external rotation of bilateral hips. Sensation intact bilaterally. Negative slump test bilaterally. Ambulates without aid, gait steady.     Skin:    General: Skin is warm and dry.     Capillary Refill: Capillary refill takes less than 2 seconds.  Neurological:     General: No focal deficit present.     Mental Status: She is alert and oriented to person, place, and time.  Psychiatric:        Mood and Affect: Mood normal.        Behavior: Behavior normal.     Ortho Exam  Imaging: No results found.  Past Medical/Family/Surgical/Social History: Medications & Allergies reviewed per EMR, new medications updated. Patient Active Problem List   Diagnosis Date Noted   Pure hypercholesterolemia 03/16/2023   Primary osteoarthritis 12/22/2022   Body mass index (BMI) 40.0-44.9, adult (HCC) 12/22/2022  Carpal tunnel syndrome, left upper limb    Screen for colon cancer    Polyp of sigmoid colon    Gastroesophageal reflux disease with esophagitis without hemorrhage    Joint synovitis 07/03/2020   Obstructive sleep apnea treated with BiPAP 10/06/2017   Occipital neuralgia of right side 06/03/2017   Uncontrolled morning headache 05/26/2017   HTN (hypertension), malignant 05/26/2017   Snoring 05/26/2017   Occipital headache 04/24/2017   Essential hypertension 04/15/2017   Hypothyroid    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 - shave excision 03/31/2023   Dysplastic nevus 02/02/2023   moderate - shave excision 03/31/2023   Essential hypertension 04/15/2017   GERD (gastroesophageal reflux disease)    Hashimoto's thyroiditis    Headache    not lately    Hypothyroidism    MRSA infection    OSA (obstructive sleep apnea)    CPAP   PONV (postoperative nausea and vomiting)    after tonsillectomy   Family History  Problem Relation Age of Onset   Hypertension Mother    Diabetes Mother        TYPE 2    Hyperlipidemia Mother    Thyroid disease Mother    Hypertension Father    Parkinsonism Father    Hyperlipidemia Father    Stroke Father    Hyperlipidemia Sister    Colon cancer Neg Hx    Stomach cancer Neg Hx    Pancreatic cancer Neg Hx    Past Surgical History:  Procedure Laterality Date   BTS     CARPAL TUNNEL RELEASE Left 11/20/2021   Procedure: LEFT CARPAL TUNNEL RELEASE;  Surgeon: Marlyne Beards, MD;  Location: [facility];  Service: Orthopedics;  Laterality: Left;   CESAREAN SECTION     COLONOSCOPY WITH PROPOFOL N/A 03/15/2021   Procedure: COLONOSCOPY WITH PROPOFOL;  Surgeon: Midge Minium, MD;  Location: [facility];  Service: Endoscopy;  Laterality: N/A;   ESOPHAGOGASTRODUODENOSCOPY (EGD) WITH PROPOFOL N/A 03/15/2021   Procedure: ESOPHAGOGASTRODUODENOSCOPY (EGD) and dillation WITH PROPOFOL;  Surgeon: Midge Minium, MD;  Location: [facility];  Service: Endoscopy;  Laterality: N/A;  sleep apnea requests early   ESOPHAGOGASTRODUODENOSCOPY (EGD) WITH PROPOFOL N/A 12/04/2022   Procedure: ESOPHAGOGASTRODUODENOSCOPY (EGD) WITH PROPOFOL;  Surgeon: Midge Minium, MD;  Location: [facility];  Service: Endoscopy;  Laterality: N/A;   TONSILLECTOMY     uterine ablation  2005   Social History   Occupational History   Occupation: Marine scientist    Employer: [employer]  Tobacco Use   Smoking status: Never    Passive exposure: Never   Smokeless tobacco: Never  Vaping Use   Vaping status: Never Used  Substance and Sexual Activity   Alcohol use: Not Currently    Comment: social drinker; very rare    Drug use: No   Sexual activity: Yes    Partners: Male    Birth  control/protection: Surgical

## 2023-05-21 ENCOUNTER — Other Ambulatory Visit (HOSPITAL_BASED_OUTPATIENT_CLINIC_OR_DEPARTMENT_OTHER): Payer: Self-pay

## 2023-05-21 ENCOUNTER — Encounter: Payer: Self-pay | Admitting: Physical Medicine and Rehabilitation

## 2023-05-27 ENCOUNTER — Encounter: Payer: Self-pay | Admitting: Family Medicine

## 2023-05-28 NOTE — Telephone Encounter (Signed)
 Can someone help her with this

## 2023-06-01 ENCOUNTER — Other Ambulatory Visit (HOSPITAL_BASED_OUTPATIENT_CLINIC_OR_DEPARTMENT_OTHER): Payer: Self-pay

## 2023-06-02 ENCOUNTER — Ambulatory Visit (INDEPENDENT_AMBULATORY_CARE_PROVIDER_SITE_OTHER): Payer: Commercial Managed Care - PPO | Admitting: Neurology

## 2023-06-02 DIAGNOSIS — G4733 Obstructive sleep apnea (adult) (pediatric): Secondary | ICD-10-CM

## 2023-06-03 ENCOUNTER — Encounter (HOSPITAL_BASED_OUTPATIENT_CLINIC_OR_DEPARTMENT_OTHER): Payer: Self-pay

## 2023-06-03 ENCOUNTER — Other Ambulatory Visit: Payer: Self-pay

## 2023-06-03 ENCOUNTER — Other Ambulatory Visit (HOSPITAL_BASED_OUTPATIENT_CLINIC_OR_DEPARTMENT_OTHER): Payer: Self-pay

## 2023-06-03 ENCOUNTER — Ambulatory Visit: Payer: Commercial Managed Care - PPO | Admitting: Physical Medicine and Rehabilitation

## 2023-06-03 ENCOUNTER — Other Ambulatory Visit (HOSPITAL_COMMUNITY): Payer: Self-pay

## 2023-06-03 DIAGNOSIS — M47816 Spondylosis without myelopathy or radiculopathy, lumbar region: Secondary | ICD-10-CM | POA: Diagnosis not present

## 2023-06-03 NOTE — Patient Instructions (Signed)

## 2023-06-06 NOTE — Procedures (Signed)
Lumbar Facet Joint Nerve Denervation  Patient: Lindsay Wade      Date of Birth: 12-08-1965 MRN: [id number] PCP: Sharlene Dory, DO      Visit Date: 06/03/2023   Universal Protocol:    Date/Time: 06/06/2411:12 PM  Consent Given By: the patient  Position: PRONE  Additional Comments: Vital signs were monitored before and after the procedure. Patient was prepped and draped in the usual sterile fashion. The correct patient, procedure, and site was verified.   Injection Procedure Details:   Procedure diagnoses:  1. Spondylosis without myelopathy or radiculopathy, lumbar region      Meds Administered: No orders of the defined types were placed in this encounter.    Laterality: Right  Location/Site:  L2-L3, L1 and L2 medial branches and L3-L4, L2 and L3 medial branches  Needle: 18 ga.,  10mm active tip, RF Cannula  Needle Placement: Along juncture of superior articular process and transverse pocess  Findings:  -Comments:  Procedure Details: For each desired target nerve, the corresponding transverse process (sacral ala for the L5 dorsal rami) was identified and the fluoroscope was positioned to square off the endplates of the corresponding vertebral body to achieve a true AP midline view.  The beam was then obliqued 15 to 20 degrees and caudally tilted 15 to 20 degrees to line up a trajectory along the target nerves. The skin over the target of the junction of superior articulating process and transverse process (sacral ala for the L5 dorsal rami) was infiltrated with 1ml of 1% Lidocaine without Epinephrine.  The 18 gauge 10mm active tip outer cannula was advanced in trajectory view to the target.  This procedure was repeated for each target nerve.  Then, for all levels, the outer cannula placement was fine-tuned and the position was then confirmed with bi-planar imaging.    Test stimulation was done both at sensory and motor levels to ensure there was no  radicular stimulation. The target tissues were then infiltrated with 1 ml of 1% Lidocaine without Epinephrine. Subsequently, a percutaneous neurotomy was carried out for 90 seconds at 80 degrees Celsius.  After the completion of the lesion, 1 ml of injectate was delivered. It was then repeated for each facet joint nerve mentioned above. Appropriate radiographs were obtained to verify the probe placement during the neurotomy.   Additional Comments:  No complications occurred Dressing: 2 x 2 sterile gauze and Band-Aid    Post-procedure details: Patient was observed during the procedure. Post-procedure instructions were reviewed.  Patient left the clinic in stable condition.

## 2023-06-06 NOTE — Progress Notes (Signed)
Lindsay Wade - 57 y.o. female MRN [id number]  Date of birth: [birth year]  Office Visit Note: Visit Date: 06/03/2023 PCP: Sharlene Dory, DO Referred by: Sharlene Dory*  Subjective: Chief Complaint  Patient presents with   Lower Back - Pain   HPI:  Lindsay Wade is a 57 y.o. female who comes in todayfor planned radiofrequency ablation of the Right L2-3 and L3-4 Lumbar facet joints. This would be ablation of the corresponding medial branches and/or dorsal rami.  Patient has had double diagnostic blocks with more than 50% relief.  These are documented on pain diary.  They have had chronic back pain for quite some time, more than 3 months, which has been an ongoing situation with recalcitrant axial back pain.  They have no radicular pain.  Their axial pain is worse with standing and ambulating and on exam today with facet loading.  They have had physical therapy as well as home exercise program.  The imaging noted in the chart below indicated facet pathology. Accordingly they meet all the criteria and qualification for for radiofrequency ablation and we are going to complete this today hopefully for more longer term relief as part of comprehensive management program.   ROS Otherwise per HPI.  Assessment & Plan: Visit Diagnoses:    ICD-10-CM   1. Spondylosis without myelopathy or radiculopathy, lumbar region  M47.816 XR C-ARM NO REPORT    Radiofrequency,Lumbar      Plan: No additional findings.   Meds & Orders: No orders of the defined types were placed in this encounter.   Orders Placed This Encounter  Procedures   Radiofrequency,Lumbar   XR C-ARM NO REPORT    Follow-up: Return if symptoms worsen or fail to improve.   Procedures: No procedures performed  Lumbar Facet Joint Nerve Denervation  Patient: Lindsay Wade      Date of Birth: [birth year] MRN: [id number] PCP: Sharlene Dory, DO      Visit Date: 06/03/2023   Universal Protocol:     Date/Time: 06/06/2411:12 PM  Consent Given By: the patient  Position: PRONE  Additional Comments: Vital signs were monitored before and after the procedure. Patient was prepped and draped in the usual sterile fashion. The correct patient, procedure, and site was verified.   Injection Procedure Details:   Procedure diagnoses:  1. Spondylosis without myelopathy or radiculopathy, lumbar region      Meds Administered: No orders of the defined types were placed in this encounter.    Laterality: Right  Location/Site:  L2-L3, L1 and L2 medial branches and L3-L4, L2 and L3 medial branches  Needle: 18 ga.,  10mm active tip, RF Cannula  Needle Placement: Along juncture of superior articular process and transverse pocess  Findings:  -Comments:  Procedure Details: For each desired target nerve, the corresponding transverse process (sacral ala for the L5 dorsal rami) was identified and the fluoroscope was positioned to square off the endplates of the corresponding vertebral body to achieve a true AP midline view.  The beam was then obliqued 15 to 20 degrees and caudally tilted 15 to 20 degrees to line up a trajectory along the target nerves. The skin over the target of the junction of superior articulating process and transverse process (sacral ala for the L5 dorsal rami) was infiltrated with 1ml of 1% Lidocaine without Epinephrine.  The 18 gauge 10mm active tip outer cannula was advanced in trajectory view to the target.  This procedure was repeated for each target nerve.  Then, for all levels, the outer cannula placement was fine-tuned and the position was then confirmed with bi-planar imaging.    Test stimulation was done both at sensory and motor levels to ensure there was no radicular stimulation. The target tissues were then infiltrated with 1 ml of 1% Lidocaine without Epinephrine. Subsequently, a percutaneous neurotomy was carried out for 90 seconds at 80 degrees Celsius.   After the completion of the lesion, 1 ml of injectate was delivered. It was then repeated for each facet joint nerve mentioned above. Appropriate radiographs were obtained to verify the probe placement during the neurotomy.   Additional Comments:  No complications occurred Dressing: 2 x 2 sterile gauze and Band-Aid    Post-procedure details: Patient was observed during the procedure. Post-procedure instructions were reviewed.  Patient left the clinic in stable condition.      Clinical History: CLINICAL DATA:  Low back pain, symptoms persist with > 6 wks treatment   EXAM: MRI LUMBAR SPINE WITHOUT CONTRAST   TECHNIQUE: Multiplanar, multisequence MR imaging of the lumbar spine was performed. No intravenous contrast was administered.   COMPARISON:  Lumbar radiographs April 29, 24 without report.   FINDINGS: Segmentation: Standard segmentation is assumed. The inferior-most fully formed intervertebral disc is labeled L5-S1.   Alignment:  No substantial sagittal subluxation.   Vertebrae:  No fracture, evidence of discitis, or bone lesion.   Conus medullaris and cauda equina: Conus extends to the L1-L2 level. Conus appears normal.   Paraspinal and other soft tissues: No acute abnormality.  Falls   Disc levels:   T12-L1: No significant disc protrusion, foraminal stenosis, or canal stenosis.   L1-L2: No significant disc protrusion, foraminal stenosis, or canal stenosis.   L2-L3: Disc bulging, ligamentum flavum thickening facet arthropathy. Mild canal stenosis. Mild bilateral foraminal stenosis.   L3-L4: Mild disc bulging and facet arthropathy without significant canal stenosis. Mild bilateral foraminal stenosis.   L4-L5: Mild disc bulging. Facet arthropathy. No significant canal or foraminal stenosis.   L5-S1: Left eccentric disc bulging with mild left foraminal stenosis. No significant canal or right foraminal stenosis.   IMPRESSION: Mild foraminal stenosis  bilaterally at L2-L3 and L3-L4 and on the left at L5-S1. Mild canal stenosis at L2-L3.     Electronically Signed   By: Feliberto Harts M.D.   On: 12/04/2022 15:55     Objective:  VS:  HT:    WT:   BMI:     BP:   HR: bpm  TEMP: ( )  RESP:  Physical Exam Vitals and nursing note reviewed.  Constitutional:      General: She is not in acute distress.    Appearance: Normal appearance. She is obese. She is not ill-appearing.  HENT:     Head: Normocephalic and atraumatic.     Right Ear: External ear normal.     Left Ear: External ear normal.  Eyes:     Extraocular Movements: Extraocular movements intact.  Cardiovascular:     Rate and Rhythm: Normal rate.     Pulses: Normal pulses.  Pulmonary:     Effort: Pulmonary effort is normal. No respiratory distress.  Abdominal:     General: There is no distension.     Palpations: Abdomen is soft.  Musculoskeletal:        General: Tenderness present.     Cervical back: Neck supple.     Right lower leg: No edema.     Left lower leg: No edema.     Comments:  Patient has good distal strength with no pain over the greater trochanters.  No clonus or focal weakness.  Skin:    Findings: No erythema, lesion or rash.  Neurological:     General: No focal deficit present.     Mental Status: She is alert and oriented to person, place, and time.     Sensory: No sensory deficit.     Motor: No weakness or abnormal muscle tone.     Coordination: Coordination normal.  Psychiatric:        Mood and Affect: Mood normal.        Behavior: Behavior normal.      Imaging: No results found.

## 2023-06-10 NOTE — Progress Notes (Signed)
[facility]   HOME SLEEP TEST REPORT ( by Watch PAT)   STUDY DATE:  06-10-2023 DOB:   MRN:    ORDERING CLINICIAN: Shawnie Dapper, NP  REFERRING CLINICIAN: PCP   CLINICAL INFORMATION/HISTORY:   Lindsay Wade is a married, right handed 57 y.o. female here for a follow up after 3 years of absence. He patient was diagnosed with sleep apnea.  Her headaches and BP problem resolved soon after BiPAP was initiated.    The patient underwent on 12-28 2018 a split-night polysomnography study.CPAP intolerant / BiPAP user for OSA, needing new machine. IPAP 16 cm water, EPAP 12 cm water, AHI < 5/h. Highly compliant .     Epworth sleepiness score: 2 /24.   BMI:  43.5 kg/m   Neck Circumference: 17"   FINDINGS:   Sleep Summary:   Total Recording Time (hours, min): 10 hours   Total Sleep Time (hours, min):     8 hours            Percent REM (%):     8%                                   Respiratory Indices by AASM criteria:   Calculated pAHI (per hour):   24.7/h                          REM pAHI:   17.3/h                                              NREM pAHI:   25.3/h                           Positional AHI: The patient slept 252 minutes on her left side with an AHI of 13.8/h and 225 minutes in supine with an AHI of 36.6/h.  During supine sleep her snoring exacerbated significantly.  Snoring statistics show a mean volume of 48 dB which is very loud.  Snoring was present for more than half of the total sleep time and reached at times over 60 dB in volume.                                                 Oxygen Saturation Statistics:   Oxygen Saturation (%) Mean:       95%        Minimum oxygen saturation (%): 83%          O2 Saturation Range (%):     Between the nadir at 83% of the maximal saturation of 99%                                  O2 Saturation (minutes) <89%:   0.6 minutes        Pulse Rate Statistics:   Pulse Mean (bpm):    55 bpm              Pulse Range:  Between 41 and 83 bpm, intermittent bradycardia was noted.           IMPRESSION:  This HST confirms the presence of ongoing moderately severe sleep apnea, no central apneas were detected by this device.    RECOMMENDATION: Continue treatment with BiPAP therapy at the same settings that the current machine provides, interface of patient's choice and comfort.  Weight loss is always recommended for patients with obstructive sleep apnea and can also reduce snoring.  Avoiding supine sleep would reduce the mean volume of snoring and the mean AHI for this patient significantly.    INTERPRETING PHYSICIAN:   Melvyn Novas, MD

## 2023-06-11 ENCOUNTER — Telehealth: Payer: Self-pay | Admitting: Physical Medicine and Rehabilitation

## 2023-06-11 ENCOUNTER — Other Ambulatory Visit (HOSPITAL_BASED_OUTPATIENT_CLINIC_OR_DEPARTMENT_OTHER): Payer: Self-pay

## 2023-06-11 ENCOUNTER — Encounter: Payer: Self-pay | Admitting: Gastroenterology

## 2023-06-11 NOTE — Telephone Encounter (Signed)
Approved today by [employer] 2017 The request has been approved. The authorization is effective from 06/11/2023 to 07/09/2023

## 2023-06-11 NOTE — Telephone Encounter (Signed)
Patient called. She would like to RSC her appointment with Dr. Newton 

## 2023-06-16 ENCOUNTER — Other Ambulatory Visit: Payer: Self-pay

## 2023-06-16 ENCOUNTER — Other Ambulatory Visit (HOSPITAL_BASED_OUTPATIENT_CLINIC_OR_DEPARTMENT_OTHER): Payer: Self-pay

## 2023-06-16 ENCOUNTER — Encounter: Payer: Self-pay | Admitting: Physical Medicine and Rehabilitation

## 2023-06-16 MED ORDER — HYDROCODONE-ACETAMINOPHEN 5-325 MG PO TABS
1.0000 | ORAL_TABLET | Freq: Three times a day (TID) | ORAL | 0 refills | Status: AC | PRN
  Filled 2023-06-16: qty 21, 7d supply, fill #0

## 2023-06-16 MED ORDER — TIZANIDINE HCL 4 MG PO TABS
2.0000 mg | ORAL_TABLET | Freq: Three times a day (TID) | ORAL | 0 refills | Status: DC | PRN
  Filled 2023-06-16: qty 60, 20d supply, fill #0

## 2023-06-16 NOTE — Telephone Encounter (Signed)
Bringing patient back in for office visit status post radiofrequency ablation with now increased pain.  Prescribed short course of hydrocodone and tizanidine.

## 2023-06-17 ENCOUNTER — Encounter: Payer: Commercial Managed Care - PPO | Admitting: Physical Medicine and Rehabilitation

## 2023-06-17 ENCOUNTER — Other Ambulatory Visit (HOSPITAL_BASED_OUTPATIENT_CLINIC_OR_DEPARTMENT_OTHER): Payer: Self-pay

## 2023-06-19 ENCOUNTER — Other Ambulatory Visit: Payer: Self-pay | Admitting: Neurology

## 2023-06-19 DIAGNOSIS — G4733 Obstructive sleep apnea (adult) (pediatric): Secondary | ICD-10-CM

## 2023-06-19 NOTE — Procedures (Signed)
[facility]   HOME SLEEP TEST REPORT ( by Watch PAT)   STUDY DATE:  06-10-2023 DOB:   MRN:    ORDERING CLINICIAN: Shawnie Dapper, NP  REFERRING CLINICIAN: PCP   CLINICAL INFORMATION/HISTORY:   Lindsay Wade is a married, right handed 57 y.o. female here for a follow up after 3 years of absence. He patient was diagnosed with sleep apnea.  Her headaches and BP problem resolved soon after BiPAP was initiated.    The patient underwent on 12-28 2018 a split-night polysomnography study.CPAP intolerant / BiPAP user for OSA, needing new machine. IPAP 16 cm water, EPAP 12 cm water, AHI < 5/h. Highly compliant .     Epworth sleepiness score: 2 /24.   BMI:  43.5 kg/m   Neck Circumference: 17"   FINDINGS:   Sleep Summary:   Total Recording Time (hours, min): 10 hours   Total Sleep Time (hours, min):     8 hours            Percent REM (%):     8%                                   Respiratory Indices by AASM criteria:   Calculated pAHI (per hour):   24.7/h                          REM pAHI:   17.3/h                                              NREM pAHI:   25.3/h                           Positional AHI: The patient slept 252 minutes on her left side with an AHI of 13.8/h and 225 minutes in supine with an AHI of 36.6/h.  During supine sleep her snoring exacerbated significantly.  Snoring statistics show a mean volume of 48 dB which is very loud.  Snoring was present for more than half of the total sleep time and reached at times over 60 dB in volume.                                                 Oxygen Saturation Statistics:   Oxygen Saturation (%) Mean:       95%        Minimum oxygen saturation (%): 83%          O2 Saturation Range (%):     Between the nadir at 83% of the maximal saturation of 99%                                  O2 Saturation (minutes) <89%:   0.6 minutes        Pulse Rate Statistics:   Pulse Mean (bpm):    55 bpm              Pulse Range:  Between 41 and 83 bpm, intermittent bradycardia was noted.           IMPRESSION:  This HST confirms the presence of ongoing moderately severe sleep apnea, no central apneas were detected by this device.    RECOMMENDATION: Continue treatment with BiPAP therapy at the same settings that the current machine provides, interface of patient's choice and comfort.  Weight loss is always recommended for patients with obstructive sleep apnea and can also reduce snoring.  Avoiding supine sleep would reduce the mean volume of snoring and the mean AHI for this patient significantly.    INTERPRETING PHYSICIAN:   Melvyn Novas, MD

## 2023-06-22 ENCOUNTER — Encounter: Payer: Self-pay | Admitting: Family Medicine

## 2023-06-22 ENCOUNTER — Ambulatory Visit: Payer: Commercial Managed Care - PPO | Admitting: Family Medicine

## 2023-06-30 ENCOUNTER — Telehealth: Payer: Self-pay | Admitting: Family Medicine

## 2023-06-30 DIAGNOSIS — G4733 Obstructive sleep apnea (adult) (pediatric): Secondary | ICD-10-CM

## 2023-06-30 NOTE — Telephone Encounter (Signed)
Pt called and stated that she has been approved for a new Cpap machine and is wanting to know how can she get a script sent to her DME for the new cpap machine

## 2023-07-03 ENCOUNTER — Encounter: Payer: Self-pay | Admitting: Family Medicine

## 2023-07-03 ENCOUNTER — Ambulatory Visit: Payer: Commercial Managed Care - PPO | Admitting: Family Medicine

## 2023-07-03 ENCOUNTER — Other Ambulatory Visit (HOSPITAL_BASED_OUTPATIENT_CLINIC_OR_DEPARTMENT_OTHER): Payer: Self-pay

## 2023-07-03 VITALS — BP 138/84 | HR 86 | Temp 98.0°F | Resp 16 | Ht 66.0 in | Wt 276.6 lb

## 2023-07-03 DIAGNOSIS — M479 Spondylosis, unspecified: Secondary | ICD-10-CM

## 2023-07-03 DIAGNOSIS — G8929 Other chronic pain: Secondary | ICD-10-CM

## 2023-07-03 DIAGNOSIS — M545 Low back pain, unspecified: Secondary | ICD-10-CM

## 2023-07-03 MED ORDER — DULOXETINE HCL 20 MG PO CPEP
20.0000 mg | ORAL_CAPSULE | Freq: Every day | ORAL | 3 refills | Status: DC
  Filled 2023-07-03: qty 30, 30d supply, fill #0

## 2023-07-03 NOTE — Progress Notes (Signed)
Musculoskeletal Exam  Patient: Lindsay Wade DOB: 24-Feb-1966  DOS: 07/03/2023  SUBJECTIVE:  Chief Complaint:   Chief Complaint  Patient presents with   Back Pain    Back pain    Lindsay Wade is a 57 y.o.  female for evaluation and treatment of back pain. She is here w her spouse  Onset:  9 months ago. Started after she was cleaning.  Location: lower R Character:  burning  Progression of issue:  is unchanged Associated symptoms: worse w movement No bruising, redness.  Denies bowel/bladder incontinence or weakness Treatment: to date has been ice, OTC NSAIDS, prescription NSAIDS, RFA, oral steroids, muscle relaxers, PT, home exercises, and injections.   Neurovascular symptoms: no  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 - shave excision 03/31/2023   Dysplastic nevus 02/02/2023   moderate - shave excision 03/31/2023   Essential hypertension 04/15/2017   GERD (gastroesophageal reflux disease)    Hashimoto's thyroiditis    Headache    not lately   Hypothyroidism    MRSA infection    OSA (obstructive sleep apnea)    CPAP   PONV (postoperative nausea and vomiting)    after tonsillectomy    Objective:  VITAL SIGNS: BP 138/84   Pulse 86   Temp 98 F (36.7 C) (Oral)   Resp 16   Ht 5\' 6"  (1.676 m)   Wt 276 lb 9.6 oz (125.5 kg)   SpO2 97%   BMI 44.64 kg/m  Constitutional: Well formed, well developed. No acute distress. HENT: Normocephalic, atraumatic.  Thorax & Lungs:  No accessory muscle use Musculoskeletal: low back.   Tenderness to palpation: yes- R thor-lumbar parasp msc Deformity: no Ecchymosis: no Straight leg test: negative for Poor hamstring flexibility b/l. Neurologic: Normal sensory function. No focal deficits noted. DTR's equal and symmetric in LE's. No clonus. 5/5 strength in LE's b/l.  Psychiatric: Normal mood. Age appropriate judgment and insight. Alert & oriented x 3.     Assessment:  Spondylosis - Plan: Ambulatory referral to Orthopedic Surgery  Chronic right-sided low back pain without sciatica - Plan: DULoxetine (CYMBALTA) 20 MG capsule  Plan: 1.  Patient felt she was not heard at [facility].  She would prefer to see another physician.  Will refer to [facility] for a 2nd opinion. Stretches/exercises, heat, ice, Tylenol. 2  chronic, uncontrolled. Trial Cymbalta 20 mg/d to help with chronic and possibly nerve related pain. F/u w me in 1 mo if not in with ortho by then.  The patient and her spouse voiced understanding and agreement to the plan.   Jilda Roche [place], DO 07/03/23  4:59 PM

## 2023-07-03 NOTE — Telephone Encounter (Signed)
Pt called stating that the DME has not received the order as of yet. Please advise.

## 2023-07-03 NOTE — Patient Instructions (Addendum)
Heat (pad or rice pillow in microwave) over affected area, 10-15 minutes twice daily.   Ice/cold pack over area for 10-15 min twice daily.  OK to take Tylenol 1000 mg (2 extra strength tabs) or 975 mg (3 regular strength tabs) every 6 hours as needed.  If you do not hear anything about your referral in the next 1-2 weeks, call our office and ask for an update.  Let us know if you need anything.

## 2023-07-06 ENCOUNTER — Encounter: Payer: Self-pay | Admitting: Family Medicine

## 2023-07-06 DIAGNOSIS — G4733 Obstructive sleep apnea (adult) (pediatric): Secondary | ICD-10-CM | POA: Diagnosis not present

## 2023-07-06 NOTE — Addendum Note (Signed)
Addended by: Guy Begin on: 07/06/2023 08:19 AM   Modules accepted: Orders

## 2023-07-06 NOTE — Telephone Encounter (Signed)
I called pt.  I did fax new order to fa [phone] (urgent request) authorize thru insurance  today ro tomorrow prior to Jan 1.

## 2023-07-06 NOTE — Telephone Encounter (Signed)
I have sent high priority message to [employer] (community message).  Updated the order (to include heated humidity and all supplies as needed).

## 2023-07-06 NOTE — Telephone Encounter (Signed)
New, Doristine Mango, RN; Natale Lay Received, thank you!     Previous Messages    ----- Message ----- From: Guy Begin, RN Sent: 07/06/2023   8:16 AM EST To: Alain Honey; Rojelio Brenner; * Subject: new orders                                    Good morning,     New order 06-19-2023 and 07/06/2023  for this pt.   Lindsay Wade Female, 57 y.o., [birth year]  MRN: [id number]  Thank you  Andrey Campanile RN [facility]

## 2023-07-15 ENCOUNTER — Other Ambulatory Visit: Payer: Self-pay | Admitting: *Deleted

## 2023-07-15 DIAGNOSIS — M7918 Myalgia, other site: Secondary | ICD-10-CM | POA: Insufficient documentation

## 2023-07-15 DIAGNOSIS — M545 Low back pain, unspecified: Secondary | ICD-10-CM | POA: Insufficient documentation

## 2023-07-15 DIAGNOSIS — G4733 Obstructive sleep apnea (adult) (pediatric): Secondary | ICD-10-CM

## 2023-07-15 DIAGNOSIS — M791 Myalgia, unspecified site: Secondary | ICD-10-CM | POA: Diagnosis not present

## 2023-07-15 DIAGNOSIS — M47816 Spondylosis without myelopathy or radiculopathy, lumbar region: Secondary | ICD-10-CM | POA: Diagnosis not present

## 2023-07-22 ENCOUNTER — Telehealth: Payer: Self-pay

## 2023-07-22 ENCOUNTER — Other Ambulatory Visit (HOSPITAL_BASED_OUTPATIENT_CLINIC_OR_DEPARTMENT_OTHER): Payer: Self-pay

## 2023-07-22 ENCOUNTER — Encounter: Payer: Self-pay | Admitting: Family Medicine

## 2023-07-22 NOTE — Telephone Encounter (Signed)
 The request has been approved.   The authorization is effective from 07/22/2023 to 08/19/2023, as long as the member is enrolled in their current health plan

## 2023-07-22 NOTE — Telephone Encounter (Signed)
 PA has been submitted via [email or web address] for [employer].... awaiting response

## 2023-07-23 ENCOUNTER — Other Ambulatory Visit (HOSPITAL_BASED_OUTPATIENT_CLINIC_OR_DEPARTMENT_OTHER): Payer: Self-pay

## 2023-07-24 ENCOUNTER — Other Ambulatory Visit (HOSPITAL_BASED_OUTPATIENT_CLINIC_OR_DEPARTMENT_OTHER): Payer: Self-pay

## 2023-07-24 ENCOUNTER — Other Ambulatory Visit (HOSPITAL_COMMUNITY): Payer: Self-pay

## 2023-07-24 ENCOUNTER — Other Ambulatory Visit: Payer: Self-pay

## 2023-07-30 NOTE — Telephone Encounter (Signed)
I called [employer] and pt has appt tomorrow 07-31-2023 at 1330 in [place] location concerning new machine.

## 2023-07-31 DIAGNOSIS — G4733 Obstructive sleep apnea (adult) (pediatric): Secondary | ICD-10-CM | POA: Diagnosis not present

## 2023-08-24 ENCOUNTER — Encounter: Payer: Self-pay | Admitting: Obstetrics and Gynecology

## 2023-08-24 ENCOUNTER — Ambulatory Visit: Payer: Commercial Managed Care - PPO | Admitting: Obstetrics and Gynecology

## 2023-08-24 VITALS — BP 168/91 | HR 53 | Ht 66.0 in | Wt 273.0 lb

## 2023-08-24 DIAGNOSIS — N644 Mastodynia: Secondary | ICD-10-CM

## 2023-08-24 NOTE — Progress Notes (Signed)
   GYNECOLOGY OFFICE VISIT NOTE  History:   Lindsay Wade is a 58 y.o. [id number] here today for breast pain.    The patient, with a history of back pain, presents with a 52-month history of bilateral breast pain, described as a 'dull ache' that feels 'deep' in the body. The pain is more pronounced in the right breast and armpit. The patient denies any changes in breast size and has not noticed any lumps or bumps. She has not tried any treatments for the discomfort. The patient also reports an inverted right nipple, which she has had since childhood. The patient had a mammogram in June, but was told her breasts were 'dense.' The patient is also currently overweight, which she attributes to a sedentary lifestyle due to back pain.  The following portions of the patient's history were reviewed and updated as appropriate: allergies, current medications, past family history, past medical history, past social history, past surgical history and problem list.   Health Maintenance:   Diagnosis  Date Value Ref Range Status  12/22/2022   Final   - Negative for intraepithelial lesion or malignancy (NILM)    Normal mammogram on June 2024.   Review of Systems:  Pertinent items noted in HPI and remainder of comprehensive ROS otherwise negative.  Physical Exam:  BP (!) 171/104   Pulse (!) 53   Ht 5\' 6"  (1.676 m)   Wt 273 lb (123.8 kg)   BMI 44.06 kg/m  CONSTITUTIONAL: Well-developed, well-nourished female in no acute distress.  HEENT:  Normocephalic, atraumatic. External right and left ear normal. No scleral icterus.  NECK: Normal range of motion, supple, no masses noted on observation SKIN: No rash noted. Not diaphoretic. No erythema. No pallor. MUSCULOSKELETAL: Normal range of motion. No edema noted. NEUROLOGIC: Alert and oriented to person, place, and time. Normal muscle tone coordination. No cranial nerve deficit noted. PSYCHIATRIC: Normal mood and affect. Normal behavior. Normal judgment and  thought content.  Breasts: breasts appear normal, no suspicious masses, no skin or nipple changes or axillary nodes.   Labs and Imaging No results found for this or any previous visit (from the past week). No results found.  Assessment and Plan:   1. Breast pain (Primary) Bilateral, deep, dull ache since June. No palpable lumps or bumps. No changes in breast size. Right nipple slightly inverted, but patient reports this has been lifelong. Dense breasts on previous mammograms. No concerning findings on physical exam. -Order diagnostic mammogram combined with ultrasound for both breasts. -If imaging is normal, consider trial of Vitamin E and Evening Primrose Oil for breast pain - [place] LIMITED ULTRASOUND INCLUDING AXILLA LEFT BREAST ; Future - [place] LIMITED ULTRASOUND INCLUDING AXILLA RIGHT BREAST; Future - MM 3D DIAGNOSTIC MAMMOGRAM BILATERAL BREAST; Future   Routine preventative health maintenance measures emphasized. Please refer to After Visit Summary for other counseling recommendations.   Return if symptoms worsen or fail to improve.  Milas Hock, MD, FACOG Obstetrician & Gynecologist, [facility] for [facility], [facility]

## 2023-08-24 NOTE — Patient Instructions (Addendum)
-  BREAST PAIN: Breast pain can be caused by various factors, including hormonal changes, dense breast tissue, or other benign conditions. We will order a diagnostic mammogram combined with an ultrasound for both breasts to get a clearer picture. If the imaging results are normal, we may try Vitamin E and Evening Primrose Oil to help alleviate the pain.  Please schedule a diagnostic mammogram combined with an ultrasound for both breasts. Follow up with your primary care provider or a specialist for your back pain.

## 2023-08-31 DIAGNOSIS — G4733 Obstructive sleep apnea (adult) (pediatric): Secondary | ICD-10-CM | POA: Diagnosis not present

## 2023-09-01 ENCOUNTER — Other Ambulatory Visit (HOSPITAL_BASED_OUTPATIENT_CLINIC_OR_DEPARTMENT_OTHER): Payer: Self-pay

## 2023-09-01 ENCOUNTER — Other Ambulatory Visit: Payer: Self-pay | Admitting: Family Medicine

## 2023-09-01 DIAGNOSIS — I10 Essential (primary) hypertension: Secondary | ICD-10-CM

## 2023-09-01 MED ORDER — CARVEDILOL 12.5 MG PO TABS
12.5000 mg | ORAL_TABLET | Freq: Two times a day (BID) | ORAL | 3 refills | Status: DC
  Filled 2023-09-01 – 2023-09-03 (×3): qty 60, 30d supply, fill #0

## 2023-09-03 ENCOUNTER — Other Ambulatory Visit (HOSPITAL_BASED_OUTPATIENT_CLINIC_OR_DEPARTMENT_OTHER): Payer: Self-pay

## 2023-09-03 ENCOUNTER — Other Ambulatory Visit (HOSPITAL_COMMUNITY): Payer: Self-pay

## 2023-09-28 DIAGNOSIS — G4733 Obstructive sleep apnea (adult) (pediatric): Secondary | ICD-10-CM | POA: Diagnosis not present

## 2023-09-30 ENCOUNTER — Other Ambulatory Visit (HOSPITAL_BASED_OUTPATIENT_CLINIC_OR_DEPARTMENT_OTHER): Payer: Self-pay

## 2023-09-30 ENCOUNTER — Encounter: Payer: Self-pay | Admitting: Family Medicine

## 2023-09-30 ENCOUNTER — Ambulatory Visit: Admitting: Family Medicine

## 2023-09-30 VITALS — BP 158/98 | HR 70 | Ht 66.0 in | Wt 277.2 lb

## 2023-09-30 DIAGNOSIS — I10 Essential (primary) hypertension: Secondary | ICD-10-CM | POA: Diagnosis not present

## 2023-09-30 MED ORDER — HYDROCHLOROTHIAZIDE 25 MG PO TABS
25.0000 mg | ORAL_TABLET | Freq: Every day | ORAL | 3 refills | Status: DC
  Filled 2023-09-30: qty 30, 30d supply, fill #0
  Filled 2023-10-28 (×2): qty 30, 30d supply, fill #1

## 2023-09-30 MED ORDER — ROSUVASTATIN CALCIUM 10 MG PO TABS
10.0000 mg | ORAL_TABLET | Freq: Every day | ORAL | 3 refills | Status: AC
  Filled 2023-09-30: qty 90, 90d supply, fill #0
  Filled 2023-12-14: qty 90, 90d supply, fill #1
  Filled 2024-03-31: qty 90, 90d supply, fill #2
  Filled 2024-05-13 – 2024-07-14 (×2): qty 90, 90d supply, fill #3

## 2023-09-30 NOTE — Patient Instructions (Signed)
Keep the diet clean and stay active. ? ?Aim to do some physical exertion for 150 minutes per week. This is typically divided into 5 days per week, 30 minutes per day. The activity should be enough to get your heart rate up. Anything is better than nothing if you have time constraints. ? ?Check your blood pressures 2-3 times per week, alternating the time of day you check it. If it is high, considering waiting 1-2 minutes and rechecking. If it gets higher, your anxiety is likely creeping up and we should avoid rechecking.  ? ?Let us know if you need anything. ?

## 2023-09-30 NOTE — Progress Notes (Signed)
 Chief Complaint  Patient presents with   Hypertension    Patient presents today for high blood pressure readings and swelling in bilateral ankles.    Subjective Lindsay Wade is a 58 y.o. female who presents for hypertension follow up. She does monitor home blood pressures. Blood pressures ranging from 150-160's/90's on average. She is compliant with medications- Coreg 12.5 mg bid. Patient has these side effects of medication: none She is sometimes adhering to a healthy diet overall. Current exercise: walking No CP or SOB.    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 - shave excision 03/31/2023   Dysplastic nevus 02/02/2023   moderate - shave excision 03/31/2023   Essential hypertension 04/15/2017   GERD (gastroesophageal reflux disease)    Hashimoto's thyroiditis    Headache    not lately   Hypothyroidism    MRSA infection    OSA (obstructive sleep apnea)    CPAP   PONV (postoperative nausea and vomiting)    after tonsillectomy    Exam BP (!) 158/98   Pulse 70   Ht 5\' 6"  (1.676 m)   Wt 277 lb 3.2 oz (125.7 kg)   SpO2 97%   BMI 44.74 kg/m  General:  well developed, well nourished, in no apparent distress Heart: RRR, no bruits, 2+ pitting bilateral LE edema tapering at the knees Lungs: clear to auscultation, no accessory muscle use Psych: well oriented with normal range of affect and appropriate judgment/insight  Essential hypertension - Plan: Basic metabolic panel, hydrochlorothiazide (HYDRODIURIL) 25 MG tablet  Chronic, uncontrolled.  Continue Coreg 12.5 mg twice daily.  Add hydrochlorothiazide 25 mg daily.  Recheck labs in 1 week.  Counseled on diet and exercise.  Monitor blood pressure at home. F/u in 1 month to recheck. The patient voiced understanding and agreement to the plan.  Jilda Roche [place], [place] 09/30/23  3:38 PM

## 2023-10-02 ENCOUNTER — Ambulatory Visit
Admission: RE | Admit: 2023-10-02 | Discharge: 2023-10-02 | Disposition: A | Discharge status: Home / Self Care | Payer: Commercial Managed Care - PPO | Source: Ambulatory Visit | Attending: Obstetrics and Gynecology | Admitting: Obstetrics and Gynecology

## 2023-10-02 ENCOUNTER — Ambulatory Visit: Payer: Commercial Managed Care - PPO

## 2023-10-02 ENCOUNTER — Other Ambulatory Visit: Payer: Commercial Managed Care - PPO

## 2023-10-02 ENCOUNTER — Ambulatory Visit: Admission: RE | Admit: 2023-10-02 | Payer: Commercial Managed Care - PPO | Source: Ambulatory Visit

## 2023-10-02 DIAGNOSIS — N644 Mastodynia: Secondary | ICD-10-CM | POA: Diagnosis not present

## 2023-10-08 DIAGNOSIS — M791 Myalgia, unspecified site: Secondary | ICD-10-CM | POA: Diagnosis not present

## 2023-10-09 ENCOUNTER — Other Ambulatory Visit

## 2023-10-09 ENCOUNTER — Other Ambulatory Visit (INDEPENDENT_AMBULATORY_CARE_PROVIDER_SITE_OTHER)

## 2023-10-09 DIAGNOSIS — E063 Autoimmune thyroiditis: Secondary | ICD-10-CM

## 2023-10-09 DIAGNOSIS — E7800 Pure hypercholesterolemia, unspecified: Secondary | ICD-10-CM | POA: Diagnosis not present

## 2023-10-09 DIAGNOSIS — I10 Essential (primary) hypertension: Secondary | ICD-10-CM

## 2023-10-09 LAB — HEPATIC FUNCTION PANEL
ALT: 19 U/L (ref 0–35)
AST: 17 U/L (ref 0–37)
Albumin: 5.1 g/dL (ref 3.5–5.2)
Alkaline Phosphatase: 70 U/L (ref 39–117)
Bilirubin, Direct: 0.1 mg/dL (ref 0.0–0.3)
Total Bilirubin: 0.8 mg/dL (ref 0.2–1.2)
Total Protein: 7.6 g/dL (ref 6.0–8.3)

## 2023-10-09 LAB — BASIC METABOLIC PANEL WITH GFR
BUN: 19 mg/dL (ref 6–23)
CO2: 27 meq/L (ref 19–32)
Calcium: 9.7 mg/dL (ref 8.4–10.5)
Chloride: 100 meq/L (ref 96–112)
Creatinine, Ser: 0.9 mg/dL (ref 0.40–1.20)
GFR: 72.02 mL/min (ref >60.00)
Glucose, Bld: 100 mg/dL — ABNORMAL HIGH (ref 70–99)
Potassium: 4 meq/L (ref 3.5–5.1)
Sodium: 139 meq/L (ref 135–145)

## 2023-10-09 LAB — TSH: TSH: 0.27 u[IU]/mL — ABNORMAL LOW (ref 0.35–5.50)

## 2023-10-09 LAB — LIPID PANEL
Cholesterol: 191 mg/dL (ref 0–200)
HDL: 63.5 mg/dL (ref >39.00)
LDL Cholesterol: 104 mg/dL — ABNORMAL HIGH (ref 0–99)
NonHDL: 127.82
Total CHOL/HDL Ratio: 3
Triglycerides: 121 mg/dL (ref 0.0–149.0)
VLDL: 24.2 mg/dL (ref 0.0–40.0)

## 2023-10-10 ENCOUNTER — Encounter: Payer: Self-pay | Admitting: Family Medicine

## 2023-10-12 ENCOUNTER — Other Ambulatory Visit: Payer: Self-pay

## 2023-10-12 ENCOUNTER — Other Ambulatory Visit

## 2023-10-12 DIAGNOSIS — E063 Autoimmune thyroiditis: Secondary | ICD-10-CM

## 2023-10-12 NOTE — Progress Notes (Signed)
 Add on lab faxed and ordered.

## 2023-10-13 ENCOUNTER — Encounter: Payer: Self-pay | Admitting: Family Medicine

## 2023-10-13 LAB — T4, FREE: Free T4: 1.5 ng/dL (ref 0.8–1.8)

## 2023-10-19 ENCOUNTER — Other Ambulatory Visit: Payer: Self-pay | Admitting: Internal Medicine

## 2023-10-19 ENCOUNTER — Other Ambulatory Visit (HOSPITAL_COMMUNITY): Payer: Self-pay

## 2023-10-20 ENCOUNTER — Other Ambulatory Visit (HOSPITAL_COMMUNITY): Payer: Self-pay

## 2023-10-22 ENCOUNTER — Other Ambulatory Visit: Payer: Self-pay

## 2023-10-22 ENCOUNTER — Other Ambulatory Visit (HOSPITAL_COMMUNITY): Payer: Self-pay

## 2023-10-22 MED ORDER — SYNTHROID 125 MCG PO TABS
125.0000 ug | ORAL_TABLET | Freq: Every day | ORAL | 1 refills | Status: DC
  Filled 2023-10-22: qty 90, 90d supply, fill #0

## 2023-10-23 ENCOUNTER — Telehealth: Payer: Self-pay

## 2023-10-23 NOTE — Telephone Encounter (Signed)
  This letter is to notify you that your prior authorization request for VOQUEZNA  10 MG TABLET has been approved based upon the information we received from your healthcare practitioner.    Benefits for all services are subject to terms, conditions and eligibility as outlined in the benefit documentation in effect at the time services are provided. The authorization is effective from 10/23/2023 to 11/20/2023, as long as you are enrolled as a member of your current health plan.   The request was approved with a quantity restriction. This has been approved for a max daily dosage of 1.0.

## 2023-10-28 ENCOUNTER — Other Ambulatory Visit (HOSPITAL_BASED_OUTPATIENT_CLINIC_OR_DEPARTMENT_OTHER): Payer: Self-pay

## 2023-10-29 DIAGNOSIS — G4733 Obstructive sleep apnea (adult) (pediatric): Secondary | ICD-10-CM | POA: Diagnosis not present

## 2023-11-04 ENCOUNTER — Ambulatory Visit: Admitting: Family Medicine

## 2023-11-06 ENCOUNTER — Other Ambulatory Visit: Payer: Self-pay

## 2023-11-06 ENCOUNTER — Other Ambulatory Visit (HOSPITAL_COMMUNITY): Payer: Self-pay

## 2023-11-13 ENCOUNTER — Encounter (HOSPITAL_COMMUNITY): Payer: Self-pay

## 2023-11-17 ENCOUNTER — Ambulatory Visit: Payer: Commercial Managed Care - PPO | Admitting: Internal Medicine

## 2023-11-20 ENCOUNTER — Encounter: Payer: Self-pay | Admitting: Family Medicine

## 2023-11-20 ENCOUNTER — Other Ambulatory Visit (HOSPITAL_BASED_OUTPATIENT_CLINIC_OR_DEPARTMENT_OTHER): Payer: Self-pay

## 2023-11-20 ENCOUNTER — Ambulatory Visit: Admitting: Family Medicine

## 2023-11-20 VITALS — BP 132/86 | HR 86 | Resp 18 | Ht 66.0 in | Wt 270.0 lb

## 2023-11-20 DIAGNOSIS — I10 Essential (primary) hypertension: Secondary | ICD-10-CM | POA: Diagnosis not present

## 2023-11-20 MED ORDER — CARVEDILOL 12.5 MG PO TABS
12.5000 mg | ORAL_TABLET | Freq: Two times a day (BID) | ORAL | 3 refills | Status: DC
  Filled 2023-11-20: qty 60, 30d supply, fill #0
  Filled 2023-12-14: qty 60, 30d supply, fill #1

## 2023-11-20 MED ORDER — CHLORTHALIDONE 25 MG PO TABS
25.0000 mg | ORAL_TABLET | Freq: Every day | ORAL | 3 refills | Status: DC
  Filled 2023-11-20: qty 30, 30d supply, fill #0
  Filled 2023-12-14: qty 30, 30d supply, fill #1
  Filled 2024-02-02: qty 30, 30d supply, fill #0
  Filled 2024-02-02: qty 30, 30d supply, fill #2
  Filled 2024-02-26: qty 30, 30d supply, fill #1

## 2023-11-20 MED ORDER — FLUTICASONE PROPIONATE 50 MCG/ACT NA SUSP
2.0000 | Freq: Every day | NASAL | 6 refills | Status: AC
Start: 2023-11-20 — End: ?
  Filled 2023-11-20: qty 16, 30d supply, fill #0
  Filled 2024-05-13: qty 16, 30d supply, fill #1

## 2023-11-20 NOTE — Progress Notes (Signed)
 Chief Complaint  Patient presents with   Follow-up    1 month    Subjective Lindsay Wade is a 58 y.o. female who presents for hypertension follow up. She does monitor home blood pressures. Blood pressures ranging from 140-150's/90's on average. She is compliant with medications- hydrochlorothiazide  25 mg/d.  She unfortunately stopped taking her Coreg  12.5 mg twice daily. Patient has these side effects of medication: none She is sometimes adhering to a healthy diet overall. Current exercise: walking No CP or SOB.    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 - shave excision 03/31/2023   Dysplastic nevus 02/02/2023   moderate - shave excision 03/31/2023   Essential hypertension 04/15/2017   GERD (gastroesophageal reflux disease)    Hashimoto's thyroiditis    Headache    not lately   Hypothyroidism    MRSA infection    OSA (obstructive sleep apnea)    CPAP   PONV (postoperative nausea and vomiting)    after tonsillectomy    Exam BP 132/86 (BP Location: Left Arm, Cuff Size: Large)   Pulse 86   Resp 18   Ht 5\' 6"  (1.676 m)   Wt 270 lb (122.5 kg)   SpO2 98%   BMI 43.58 kg/m  General:  well developed, well nourished, in no apparent distress Heart: RRR, no bruits, 2+ pitting bilateral lower extremity edema tapering at the proximal third of the tibia Lungs: clear to auscultation, no accessory muscle use Psych: well oriented with normal range of affect and appropriate judgment/insight  Essential hypertension - Plan: chlorthalidone (HYGROTON) 25 MG tablet, carvedilol  (COREG ) 12.5 MG tablet  Chronic, not fully controlled.  Change hydrochlorothiazide  25 mg daily to chlorthalidone 25 mg daily to see if we can get better control with her swelling.  Add back carvedilol  12.5 mg twice daily.  Monitor blood pressure at home.  Send readings in 3 weeks.  Counseled on diet and exercise. F/u in 6 weeks. The patient voiced  understanding and agreement to the plan.  Shellie Dials [place], DO 11/20/23  1:27 PM

## 2023-11-20 NOTE — Patient Instructions (Addendum)
 Go back on your Coreg .  Keep the diet clean and stay active.  Check your blood pressures 2-3 times per week, alternating the time of day you check it. If it is high, considering waiting 1-2 minutes and rechecking. If it gets higher, your anxiety is likely creeping up and we should avoid rechecking.   Send me some readings on MyChart in 3 weeks.   Let us  know if you need anything.

## 2023-11-25 ENCOUNTER — Ambulatory Visit: Payer: Commercial Managed Care - PPO | Admitting: Dermatology

## 2023-11-28 DIAGNOSIS — G4733 Obstructive sleep apnea (adult) (pediatric): Secondary | ICD-10-CM | POA: Diagnosis not present

## 2023-12-02 ENCOUNTER — Other Ambulatory Visit: Payer: Self-pay

## 2023-12-14 ENCOUNTER — Other Ambulatory Visit (HOSPITAL_BASED_OUTPATIENT_CLINIC_OR_DEPARTMENT_OTHER): Payer: Self-pay

## 2023-12-14 ENCOUNTER — Other Ambulatory Visit: Payer: Self-pay

## 2023-12-17 ENCOUNTER — Telehealth: Payer: Self-pay

## 2023-12-17 ENCOUNTER — Other Ambulatory Visit (HOSPITAL_COMMUNITY): Payer: Self-pay

## 2023-12-17 NOTE — Telephone Encounter (Signed)
 The request has been approved. The authorization is effective from 12/17/2023 to 01/14/2024

## 2023-12-23 ENCOUNTER — Telehealth: Payer: Self-pay | Admitting: *Deleted

## 2023-12-23 NOTE — Telephone Encounter (Signed)
Left patient a message to call and schedule annual. 

## 2023-12-25 ENCOUNTER — Ambulatory Visit: Admitting: Family Medicine

## 2023-12-25 ENCOUNTER — Other Ambulatory Visit (HOSPITAL_COMMUNITY): Payer: Self-pay

## 2023-12-25 ENCOUNTER — Other Ambulatory Visit: Payer: Self-pay

## 2023-12-29 DIAGNOSIS — G4733 Obstructive sleep apnea (adult) (pediatric): Secondary | ICD-10-CM | POA: Diagnosis not present

## 2024-01-11 NOTE — Progress Notes (Deleted)
   ANNUAL EXAM Patient name: Lindsay Wade MRN [id number]  Date of birth: 08/23/1965 Chief Complaint:   No chief complaint on file.  History of Present Illness:   Lindsay Wade is a 58 y.o. G72P2002 female being seen today for a routine annual exam.   Current concerns: ***  No LMP recorded. Patient is postmenopausal.   Last MXR: Normal 09/2023 - birad 1, cat b  Last Pap/Pap History:  2018: pap/hpv wnl 2021: pap/hpv wnl 12/2022: pap/hpv wnl.    Review of Systems:   Pertinent items are noted in HPI Denies any headaches, blurred vision, fatigue, shortness of breath, chest pain, abdominal pain, abnormal vaginal discharge/itching/odor/irritation, problems with periods, bowel movements, urination, or intercourse unless otherwise stated above. *** Pertinent History Reviewed:  Reviewed past medical,surgical, social and family history.  Reviewed problem list, medications and allergies. Physical Assessment:  There were no vitals filed for this visit.There is no height or weight on file to calculate BMI.   Physical Examination:  General appearance - well appearing, and in no distress Mental status - alert, oriented to person, place, and time Psych:  She has a normal mood and affect Skin - warm and dry, normal color, no suspicious lesions noted Chest - effort normal Heart - normal rate  Breasts - breasts appear normal, no suspicious masses, no skin or nipple changes or axillary nodes Abdomen - soft, nontender, nondistended, no masses or organomegaly Pelvic -  {Blank single:19197::Performed and:,declines,not indicated} VULVA: {Blank single:19197::Not examined,normal appearing vulva with no masses, tenderness or lesions,***} VAGINA: {Blank single:19197::Not examined,normal appearing vagina with normal color and discharge, no lesions,***} CERVIX: {Blank single:19197::Not examined,normal appearing cervix without discharge or lesions, no CMT.,***} UTERUS:  {Blank single:19197::Not examined,uterus is felt to be normal size, shape, consistency and nontender,***} ADNEXA: {Blank single:19197::Not examined,No adnexal masses or tenderness noted,***} Extremities:  No swelling or varicosities noted  Chaperone present for exam  No results found for this or any previous visit (from the past 24 hours).  Assessment & Plan:  Diagnoses and all orders for this visit:  Well woman exam with routine gynecological exam  - Cervical cancer screening: Discussed guidelines. Pap with HPV wnl 12/2022 - Breast Health: Encouraged self breast awareness/SBE. Discussed limits of clinical breast exam for detecting breast cancer. Discussed importance of annual MXR. MXR is up to date: 09/2023 - Climacteric/Sexual health: Reviewed typical and atypical symptoms of menopause/peri-menopause. Discussed PMB and to call if any amount of spotting.  - Colonoscopy: 03/2021 - F/U 12 months and prn     No orders of the defined types were placed in this encounter.   Meds: No orders of the defined types were placed in this encounter.   Follow-up: No follow-ups on file.  Vina Solian, MD 01/11/2024 11:55 AM

## 2024-01-12 ENCOUNTER — Telehealth: Payer: Self-pay | Admitting: *Deleted

## 2024-01-12 NOTE — Telephone Encounter (Signed)
 Returned call. Left patient a message that appointment has been rescheduled.

## 2024-01-14 ENCOUNTER — Ambulatory Visit: Admitting: Obstetrics and Gynecology

## 2024-01-14 DIAGNOSIS — Z01419 Encounter for gynecological examination (general) (routine) without abnormal findings: Secondary | ICD-10-CM

## 2024-01-14 NOTE — Progress Notes (Signed)
 Name: Lindsay Wade  MRN/ DOB: [id number], 09-16-1965    Age/ Sex: 58 y.o., female    PCP: Frann Mabel Mt, DO   Reason for Endocrinology Evaluation: Hypothyroidism     Date of Initial Endocrinology Evaluation: 11/27/2020    HPI: [name] is a 58 y.o. female with a past medical history of OSA , hypothyroidism and HTN. The patient presented for initial endocrinology clinic visit on 11/27/2020  for consultative assistance with her hypothyroidism.   She has been diagnosed with hypothyroidism ~> 20 years ago secondary to Hashimoto's Thyroiditis.    ON her initial visit to our clinic she was c/o local neck symptoms, ultrasound showed sub-centimeter cystic lesion on the left  12/2020    Abnormal neck sensation  -She has had a sensation in her neck since 2015, feels like there is something in the neck -She has had thyroid  ultrasound in 2018 which showed no nodules, thyroid  ultrasound 2022 showed subcentimeters cyst at the left upper pole of the thyroid  -Her 2022 ultrasound showed atrophic thyroid  which is consistent with Hashimoto's thyroiditis  CT neck confirmed largely a 2 5 thyroid  gland, cervical lymph nodes are at the upper limits of normal, she was referred to ENT 2024.  SUBJECTIVE:    Today (01/15/24):  Lindsay Wade is here for a follow up on hypothyroidism.   Weight continues to fluctuate Denies constipation or diarrhea  Denies palpitations  Denies tremors  Denies depression and anxiety  She is on a BIPAP machine  She is c/o weight gain  No Biotin   She has been using the Synthroid  150 mcg up until   Synthroid   125 mcg daily  HISTORY:  Past Medical History:  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 - shave excision 03/31/2023   Dysplastic nevus 02/02/2023   moderate - shave excision 03/31/2023   Essential hypertension 04/15/2017   GERD (gastroesophageal reflux disease)     Hashimoto's thyroiditis    Headache    not lately   Hypothyroidism    MRSA infection    OSA (obstructive sleep apnea)    CPAP   PONV (postoperative nausea and vomiting)    after tonsillectomy   Past Surgical History:  Past Surgical History:  Procedure Laterality Date   BTS     CARPAL TUNNEL RELEASE Left 11/20/2021   Procedure: LEFT CARPAL TUNNEL RELEASE;  Surgeon: Romona Harari, MD;  Location: [facility];  Service: Orthopedics;  Laterality: Left;   CESAREAN SECTION     COLONOSCOPY WITH PROPOFOL  N/A 03/15/2021   Procedure: COLONOSCOPY WITH PROPOFOL ;  Surgeon: Jinny Carmine, MD;  Location: [facility];  Service: Endoscopy;  Laterality: N/A;   ESOPHAGOGASTRODUODENOSCOPY (EGD) WITH PROPOFOL  N/A 03/15/2021   Procedure: ESOPHAGOGASTRODUODENOSCOPY (EGD) and dillation WITH PROPOFOL ;  Surgeon: Jinny Carmine, MD;  Location: [facility];  Service: Endoscopy;  Laterality: N/A;  sleep apnea requests early   ESOPHAGOGASTRODUODENOSCOPY (EGD) WITH PROPOFOL  N/A 12/04/2022   Procedure: ESOPHAGOGASTRODUODENOSCOPY (EGD) WITH PROPOFOL ;  Surgeon: Jinny Carmine, MD;  Location: [facility];  Service: Endoscopy;  Laterality: N/A;   TONSILLECTOMY     uterine ablation  2005    Social History:  reports that she has never smoked. She has never been exposed to tobacco smoke. She has never used smokeless tobacco. She reports that she does not currently use alcohol. She reports that she does not use drugs. Family History: family history includes Diabetes in  her mother; Hyperlipidemia in her father, mother, and sister; Hypertension in her father and mother; Parkinsonism in her father; Stroke in her father; Thyroid  disease in her mother.   HOME MEDICATIONS: Allergies as of 01/15/2024   No Known Allergies      Medication List        Accurate as of January 15, 2024 12:01 PM. If you have any questions, ask your nurse or doctor.          STOP taking these medications     mupirocin  ointment 2 % Commonly known as: BACTROBAN  Stopped by: Mabel Mt [place]       TAKE these medications    carvedilol  25 MG tablet Commonly known as: COREG  Take 1 tablet (25 mg total) by mouth 2 (two) times daily with a meal. What changed:  medication strength how much to take Changed by: Mabel Mt [place]   chlorthalidone  25 MG tablet Commonly known as: HYGROTON  Take 1 tablet (25 mg total) by mouth daily.   fluticasone  50 MCG/ACT nasal spray Commonly known as: FLONASE  Place 2 sprays into both nostrils daily.   furosemide  40 MG tablet Commonly known as: LASIX  Take 1 tablet (40 mg total) by mouth daily. Started by: Mabel Mt Pry   rosuvastatin  10 MG tablet Commonly known as: Crestor  Take 1 tablet (10 mg total) by mouth daily.   Synthroid  125 MCG tablet Generic drug: levothyroxine  Take 1 tablet (125 mcg total) by mouth daily before breakfast.   Voquezna  10 MG Tabs Generic drug: Vonoprazan Fumarate  Take one tablet (10 mg) by mouth daily.          REVIEW OF SYSTEMS: A comprehensive ROS was conducted with the patient and is negative except as per HPI    OBJECTIVE:  VS: BP 122/84 (BP Location: Left Arm, Patient Position: Sitting, Cuff Size: Normal)   Pulse 80   Ht 5' 6 (1.676 m)   Wt 277 lb (125.6 kg)   SpO2 97%   BMI 44.71 kg/m    Wt Readings from Last 3 Encounters:  01/15/24 277 lb (125.6 kg)  01/15/24 277 lb 12.8 oz (126 kg)  11/20/23 270 lb (122.5 kg)     EXAM: General: Pt appears well and is in NAD  Eyes: External eye exam normal without stare, lid lag or exophthalmos.  EOM intact.   Neck: General: Supple without adenopathy. Thyroid : Thyroid  size normal.  No goiter or nodules appreciated.  Lungs: Clear with good BS bilat   Heart: Auscultation: RRR.  Abdomen: soft, nontender  Extremities:  BL LE: Trace pretibial edema  Mental Status: Judgment, insight: Intact Orientation: Oriented to time, place, and  person Mood and affect: No depression, anxiety, or agitation     DATA REVIEWED:  Latest Reference Range & Units 10/09/23 13:16 10/12/23 10:24  TSH 0.35 - 5.50 uIU/mL 0.27 (L)   T4,Free(Direct) 0.8 - 1.8 ng/dL  1.5    Thyroid  Ultrasound 12/24/2020   Estimated total number of nodules >/= 1 cm: 0   Number of spongiform nodules >/=  2 cm not described below (TR1): 0   Number of mixed cystic and solid nodules >/= 1.5 cm not described below (TR2): 0   _________________________________________________________   There are 2 subcentimeter cystic appearing nodules in the left superior thyroid , presumed benign and not warranting additional follow-up.   IMPRESSION: Atrophic and markedly heterogeneous thyroid  gland with a few subcentimeter cystic nodules in the left superior thyroid  in keeping with history of autoimmune thyroid  disease. No dedicated ultrasound follow-up  or tissue sampling is recommended for the benign-appearing cysts.  ASSESSMENT/PLAN/RECOMMENDATIONS:   Hashimoto's Thyroiditis :  - She was diagnosed with Hashimoto's Thyroiditis in her 63's.  -TSH continues to be low, I decreased her Synthroid  from 150 mcg to 125 mcg, but the patient states she continued to use Synthroid  150 mcg every third day or so - We discussed increased risk of cardiac arrhythmia and bone resorption with low TSH - Patient finished Synthroid  150 approximately 3 weeks ago - She will have labs done in a month through PCPs office, printed forms were provided - Patient was also wondering if there are any other alternatives in addition to LT-4 , as she is continuing to gain weight. - We did briefly discuss LT-3 replacement, but I did explain to the patient that this is not going to help her with weight loss and thyroid  hormone therapy is not a means of weight loss medication  Medications :  Synthroid  125 mcg daily  2.  Weight gain  -She will return to follow-up with weight management clinic -Part  of her weight is fluid overload, she is on diuretics -She continues to be in weight watchers   F/U in 4 months   I spent 25 minutes preparing to see the patient by review of recent labs, imaging and procedures, obtaining and reviewing separately obtained history, communicating with the patient/family or caregiver, ordering medications, tests or procedures, and documenting clinical information in the EHR including the differential Dx, treatment, and any further evaluation and other management    Signed electronically by: Stefano Redgie Butts, MD  [facility] Endocrinology  [facility] [address] [place] [place], [place] [address] Phone: [phone] FAX: [phone]   CC: Frann Mabel Mt, DO [address] [place] [address] Phone: [phone] Fax: [phone]   Return to Endocrinology clinic as below: Future Appointments  Date Time Provider Department Center  01/26/2024  4:15 PM Alm Delon SAILOR, DO [facility] None  01/29/2024  1:45 PM Frann Mabel Mt, DO [facility]  02/18/2024 10:50 AM Cleatus Moccasin, MD [facility]  02/26/2024  8:30 AM Frann, Mabel Mt, DO [facility]

## 2024-01-15 ENCOUNTER — Other Ambulatory Visit (HOSPITAL_BASED_OUTPATIENT_CLINIC_OR_DEPARTMENT_OTHER): Payer: Self-pay

## 2024-01-15 ENCOUNTER — Encounter: Payer: Self-pay | Admitting: Internal Medicine

## 2024-01-15 ENCOUNTER — Ambulatory Visit: Admitting: Internal Medicine

## 2024-01-15 ENCOUNTER — Encounter: Payer: Self-pay | Admitting: Family Medicine

## 2024-01-15 ENCOUNTER — Ambulatory Visit: Admitting: Family Medicine

## 2024-01-15 ENCOUNTER — Ambulatory Visit: Payer: Self-pay | Admitting: Family Medicine

## 2024-01-15 VITALS — BP 136/86 | HR 80 | Temp 98.0°F | Resp 16 | Ht 66.0 in | Wt 277.8 lb

## 2024-01-15 VITALS — BP 122/84 | HR 80 | Ht 66.0 in | Wt 277.0 lb

## 2024-01-15 DIAGNOSIS — M7989 Other specified soft tissue disorders: Secondary | ICD-10-CM | POA: Diagnosis not present

## 2024-01-15 DIAGNOSIS — I10 Essential (primary) hypertension: Secondary | ICD-10-CM

## 2024-01-15 DIAGNOSIS — E063 Autoimmune thyroiditis: Secondary | ICD-10-CM | POA: Diagnosis not present

## 2024-01-15 LAB — BASIC METABOLIC PANEL WITH GFR
BUN: 15 mg/dL (ref 6–23)
CO2: 30 meq/L (ref 19–32)
Calcium: 10 mg/dL (ref 8.4–10.5)
Chloride: 103 meq/L (ref 96–112)
Creatinine, Ser: 0.89 mg/dL (ref 0.40–1.20)
GFR: 72.85 mL/min (ref >60.00)
Glucose, Bld: 95 mg/dL (ref 70–99)
Potassium: 3.8 meq/L (ref 3.5–5.1)
Sodium: 142 meq/L (ref 135–145)

## 2024-01-15 MED ORDER — FUROSEMIDE 40 MG PO TABS
40.0000 mg | ORAL_TABLET | Freq: Every day | ORAL | 3 refills | Status: DC
  Filled 2024-01-15: qty 30, 30d supply, fill #0

## 2024-01-15 MED ORDER — CARVEDILOL 25 MG PO TABS
25.0000 mg | ORAL_TABLET | Freq: Two times a day (BID) | ORAL | 3 refills | Status: AC
  Filled 2024-01-15 – 2024-04-14 (×2): qty 180, 90d supply, fill #0
  Filled 2024-05-13 – 2024-07-14 (×2): qty 180, 90d supply, fill #1

## 2024-01-15 MED ORDER — POTASSIUM CHLORIDE ER 10 MEQ PO TBCR: EXTENDED_RELEASE_TABLET | ORAL | 2 refills | Status: AC

## 2024-01-15 NOTE — Patient Instructions (Addendum)
You are on Synthroid  - which is your thyroid hormone supplement. You MUST take this consistently.  You should take this first thing in the morning on an empty stomach with water. You should not take it with other medications. Wait 30min to 1hr prior to eating. If you are taking any vitamins - please take these in the evening.   If you miss a dose, please take your missed dose the following day (double the dose for that day). You should have a pill box for ONLY Synthroid  on your bedside table to help you remember to take your medications.  

## 2024-01-15 NOTE — Progress Notes (Signed)
 Chief Complaint  Patient presents with   Medication Refill    Medication Refill    Subjective Lindsay Wade is a 58 y.o. female who presents for hypertension follow up. She does monitor home blood pressures. Blood pressures ranging from 130's/80's on average. She is compliant with medications- Coreg  12.5 mg bid, chlorthalidone  25 mg/d. Patient has these side effects of medication: none She is usually adhering to a healthy diet overall. Current exercise: walking No CP or SOB.  Still having swelling in her legs.    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 - shave excision 03/31/2023   Dysplastic nevus 02/02/2023   moderate - shave excision 03/31/2023   Essential hypertension 04/15/2017   GERD (gastroesophageal reflux disease)    Hashimoto's thyroiditis    Headache    not lately   Hypothyroidism    MRSA infection    OSA (obstructive sleep apnea)    CPAP   PONV (postoperative nausea and vomiting)    after tonsillectomy    Exam BP 136/86 (BP Location: Left Arm, Patient Position: Sitting)   Pulse 80   Temp 98 F (36.7 C) (Oral)   Resp 16   Ht 5' 6 (1.676 m)   Wt 277 lb 12.8 oz (126 kg)   SpO2 96%   BMI 44.84 kg/m  General:  well developed, well nourished, in no apparent distress Heart: RRR, no bruits, 2+ pitting LE edema tapering at the mid tibia Lungs: clear to auscultation, no accessory muscle use Psych: well oriented with normal range of affect and appropriate judgment/insight  Essential hypertension - Plan: Basic metabolic panel with GFR, carvedilol  (COREG ) 25 MG tablet  Localized swelling of both lower extremities - Plan: furosemide  (LASIX ) 40 MG tablet  Chronic, not optimized. Cont chlorthalidone  25 mg/d, increase Coreg  to 25 mg bid. Monitor BP at home. Counseled on diet and exercise. F/u in 1 mo. Chronic, not controlled. Failed conservative therapies like exercise, decreasing salt, and  compression stockings. Will trial Lasix  40 mg/d. Ck lytes today, may need to add K supp. Will f/u in 1 week for labs near her home. She will let us  know so we can send the order there.  The patient voiced understanding and agreement to the plan.  Mabel Mt [place], DO 01/15/24  10:41 AM

## 2024-01-15 NOTE — Patient Instructions (Addendum)
 Keep the diet clean and stay active.  Check your blood pressures 2-3 times per week, alternating the time of day you check it. If it is high, considering waiting 1-2 minutes and rechecking. If it gets higher, your anxiety is likely creeping up and we should avoid rechecking.   Send us  a message or call with a local lab location you'd like to see in 1 week.   Let us  know if you need anything.

## 2024-01-19 ENCOUNTER — Other Ambulatory Visit (HOSPITAL_COMMUNITY): Payer: Self-pay

## 2024-01-21 DIAGNOSIS — M791 Myalgia, unspecified site: Secondary | ICD-10-CM | POA: Diagnosis not present

## 2024-01-22 ENCOUNTER — Other Ambulatory Visit: Payer: Self-pay

## 2024-01-22 ENCOUNTER — Encounter: Payer: Self-pay | Admitting: Family Medicine

## 2024-01-22 DIAGNOSIS — I10 Essential (primary) hypertension: Secondary | ICD-10-CM

## 2024-01-22 NOTE — Telephone Encounter (Signed)
 Called pt was advised and lab orders placed to [facility].

## 2024-01-26 ENCOUNTER — Other Ambulatory Visit: Payer: Self-pay | Admitting: Family Medicine

## 2024-01-26 ENCOUNTER — Encounter: Payer: Self-pay | Admitting: Dermatology

## 2024-01-26 ENCOUNTER — Ambulatory Visit: Admitting: Dermatology

## 2024-01-26 VITALS — BP 122/81 | HR 57

## 2024-01-26 DIAGNOSIS — W908XXA Exposure to other nonionizing radiation, initial encounter: Secondary | ICD-10-CM | POA: Diagnosis not present

## 2024-01-26 DIAGNOSIS — L821 Other seborrheic keratosis: Secondary | ICD-10-CM

## 2024-01-26 DIAGNOSIS — D1801 Hemangioma of skin and subcutaneous tissue: Secondary | ICD-10-CM

## 2024-01-26 DIAGNOSIS — L578 Other skin changes due to chronic exposure to nonionizing radiation: Secondary | ICD-10-CM | POA: Diagnosis not present

## 2024-01-26 DIAGNOSIS — R1310 Dysphagia, unspecified: Secondary | ICD-10-CM | POA: Insufficient documentation

## 2024-01-26 DIAGNOSIS — L814 Other melanin hyperpigmentation: Secondary | ICD-10-CM

## 2024-01-26 DIAGNOSIS — Z1283 Encounter for screening for malignant neoplasm of skin: Secondary | ICD-10-CM | POA: Diagnosis not present

## 2024-01-26 DIAGNOSIS — R140 Abdominal distension (gaseous): Secondary | ICD-10-CM | POA: Insufficient documentation

## 2024-01-26 DIAGNOSIS — I10 Essential (primary) hypertension: Secondary | ICD-10-CM | POA: Diagnosis not present

## 2024-01-26 DIAGNOSIS — D229 Melanocytic nevi, unspecified: Secondary | ICD-10-CM

## 2024-01-26 DIAGNOSIS — K219 Gastro-esophageal reflux disease without esophagitis: Secondary | ICD-10-CM | POA: Insufficient documentation

## 2024-01-26 NOTE — Progress Notes (Signed)
   Total Body Skin Exam (TBSE) Visit   Subjective  Lindsay Wade is a 58 y.o. female who presents for the following: Skin Cancer Screening and Full Body Skin Exam  Patient presents today for follow up visit for TBSE. Patient was last evaluated on 03/30/24 . Patient denies medication changes. Patient reports she does have spots, moles and lesions of concern to be evaluated. Patient reports throughout her lifetime she has had severe sun exposure. Currently, patient reports if she has excessive sun exposure, she does not apply sunscreen and/or wears protective coverings. Patient reports she has hx of bx (Left Flank and Left posterior leg DN both Shave excised). Patient denies  family history of skin cancers. The patient has spots, moles and lesions to be evaluated, some may be new or changing and the patient has concerns that these could be cancer.  The following portions of the chart were reviewed this encounter and updated as appropriate: medications, allergies, medical history  Review of Systems:  No other skin or systemic complaints except as noted in HPI or Assessment and Plan.  Objective  Well appearing patient in no apparent distress; mood and affect are within normal limits.  A full examination was performed including scalp, head, eyes, ears, nose, lips, neck, chest, axillae, abdomen, back, buttocks, bilateral upper extremities, bilateral lower extremities, hands, feet, fingers, toes, fingernails, and toenails. All findings within normal limits unless otherwise noted below.   Relevant physical exam findings are noted in the Assessment and Plan.    Assessment & Plan   LENTIGINES, SEBORRHEIC KERATOSES, HEMANGIOMAS - Benign normal skin lesions - Benign-appearing - Call for any changes  MELANOCYTIC NEVI - Tan-brown and/or pink-flesh-colored symmetric macules and papules - Benign appearing on exam today - Observation - Call clinic for new or changing moles - Recommend daily use of  broad spectrum spf 30+ sunscreen to sun-exposed areas.   SEVERE ACTINIC DAMAGE - Chronic condition, secondary to cumulative UV/sun exposure - diffuse scaly erythematous macules with underlying dyspigmentation - Recommend daily broad spectrum sunscreen SPF 30+ to sun-exposed areas, reapply every 2 hours as needed.  - Staying in the shade or wearing long sleeves, sun glasses (UVA+UVB protection) and wide brim hats (4-inch brim around the entire circumference of the hat) are also recommended for sun protection.  - Call for new or changing lesions.  SKIN CANCER SCREENING PERFORMED TODAY.    Return in about 1 year (around 01/25/2025) for TBSE.  I, Lazlo Tunney, am acting as Neurosurgeon for [employer], DO.  Documentation: I have reviewed the above documentation for accuracy and completeness, and I agree with the above.  Delon Lenis, DO

## 2024-01-26 NOTE — Patient Instructions (Addendum)

## 2024-01-27 ENCOUNTER — Ambulatory Visit: Payer: Self-pay | Admitting: Family Medicine

## 2024-01-27 LAB — BASIC METABOLIC PANEL WITH GFR
BUN/Creatinine Ratio: 26 — ABNORMAL HIGH (ref 9–23)
BUN: 31 mg/dL — ABNORMAL HIGH (ref 6–24)
CO2: 22 mmol/L (ref 20–29)
Calcium: 9.6 mg/dL (ref 8.7–10.2)
Chloride: 98 mmol/L (ref 96–106)
Creatinine, Ser: 1.21 mg/dL — ABNORMAL HIGH (ref 0.57–1.00)
Glucose: 113 mg/dL — ABNORMAL HIGH (ref 70–99)
Potassium: 3.5 mmol/L (ref 3.5–5.2)
Sodium: 142 mmol/L (ref 134–144)
eGFR: 53 mL/min/1.73 — ABNORMAL LOW (ref >59)

## 2024-01-28 ENCOUNTER — Other Ambulatory Visit (HOSPITAL_COMMUNITY): Payer: Self-pay

## 2024-01-28 DIAGNOSIS — G4733 Obstructive sleep apnea (adult) (pediatric): Secondary | ICD-10-CM | POA: Diagnosis not present

## 2024-01-29 ENCOUNTER — Encounter: Admitting: Family Medicine

## 2024-02-01 ENCOUNTER — Ambulatory Visit: Admitting: Obstetrics and Gynecology

## 2024-02-02 ENCOUNTER — Other Ambulatory Visit: Payer: Self-pay

## 2024-02-02 ENCOUNTER — Other Ambulatory Visit (HOSPITAL_BASED_OUTPATIENT_CLINIC_OR_DEPARTMENT_OTHER): Payer: Self-pay

## 2024-02-02 ENCOUNTER — Other Ambulatory Visit (HOSPITAL_COMMUNITY): Payer: Self-pay

## 2024-02-05 ENCOUNTER — Ambulatory Visit: Payer: Self-pay | Admitting: Family Medicine

## 2024-02-05 ENCOUNTER — Encounter: Payer: Self-pay | Admitting: Family Medicine

## 2024-02-05 ENCOUNTER — Other Ambulatory Visit: Payer: Self-pay

## 2024-02-05 ENCOUNTER — Ambulatory Visit (INDEPENDENT_AMBULATORY_CARE_PROVIDER_SITE_OTHER): Admitting: Family Medicine

## 2024-02-05 VITALS — BP 128/80 | HR 68 | Temp 97.7°F | Resp 16 | Ht 66.0 in | Wt 269.0 lb

## 2024-02-05 DIAGNOSIS — Z0000 Encounter for general adult medical examination without abnormal findings: Secondary | ICD-10-CM

## 2024-02-05 DIAGNOSIS — E063 Autoimmune thyroiditis: Secondary | ICD-10-CM | POA: Diagnosis not present

## 2024-02-05 LAB — CBC
HCT: 43.3 % (ref 36.0–46.0)
Hemoglobin: 14.4 g/dL (ref 12.0–15.0)
MCHC: 33.2 g/dL (ref 30.0–36.0)
MCV: 87.8 fl (ref 78.0–100.0)
Platelets: 329 K/uL (ref 150.0–400.0)
RBC: 4.92 Mil/uL (ref 3.87–5.11)
RDW: 13.8 % (ref 11.5–15.5)
WBC: 12.9 K/uL — ABNORMAL HIGH (ref 4.0–10.5)

## 2024-02-05 LAB — LIPID PANEL
Cholesterol: 174 mg/dL (ref 0–200)
HDL: 73.2 mg/dL (ref >39.00)
LDL Cholesterol: 83 mg/dL (ref 0–99)
NonHDL: 101.2
Total CHOL/HDL Ratio: 2
Triglycerides: 90 mg/dL (ref 0.0–149.0)
VLDL: 18 mg/dL (ref 0.0–40.0)

## 2024-02-05 LAB — T4, FREE: Free T4: 1.02 ng/dL (ref 0.60–1.60)

## 2024-02-05 LAB — COMPREHENSIVE METABOLIC PANEL WITH GFR
ALT: 17 U/L (ref 0–35)
AST: 18 U/L (ref 0–37)
Albumin: 4.8 g/dL (ref 3.5–5.2)
Alkaline Phosphatase: 78 U/L (ref 39–117)
BUN: 28 mg/dL — ABNORMAL HIGH (ref 6–23)
CO2: 31 meq/L (ref 19–32)
Calcium: 9.9 mg/dL (ref 8.4–10.5)
Chloride: 97 meq/L (ref 96–112)
Creatinine, Ser: 1.15 mg/dL (ref 0.40–1.20)
GFR: 53.54 mL/min — ABNORMAL LOW (ref >60.00)
Glucose, Bld: 95 mg/dL (ref 70–99)
Potassium: 3.3 meq/L — ABNORMAL LOW (ref 3.5–5.1)
Sodium: 140 meq/L (ref 135–145)
Total Bilirubin: 0.8 mg/dL (ref 0.2–1.2)
Total Protein: 7.7 g/dL (ref 6.0–8.3)

## 2024-02-05 LAB — TSH: TSH: 1.41 u[IU]/mL (ref 0.35–5.50)

## 2024-02-05 MED ORDER — SYNTHROID 125 MCG PO TABS
125.0000 ug | ORAL_TABLET | Freq: Every day | ORAL | 3 refills | Status: AC
  Filled 2024-02-05: qty 90, 90d supply, fill #0
  Filled 2024-04-14 (×2): qty 90, 90d supply, fill #1
  Filled 2024-07-14: qty 90, 90d supply, fill #2

## 2024-02-05 NOTE — Progress Notes (Signed)
 Chief Complaint  Patient presents with   Annual Exam    CPE     Well Woman Lindsay Wade is here for a complete physical.   Her last physical was >1 year ago.  Current diet: in general, diet is fair. Current exercise: walking, swimming. Weight is stable and she denies fatigue out of ordinary. Seatbelt? Yes Advanced directive? No  Health Maintenance Pap/HPV- Yes Mammogram- Yes Colon cancer screening-Yes Shingrix- No Tetanus- Yes Hep C screening- Yes HIV screening- Yes  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 - shave excision 03/31/2023   Dysplastic nevus 02/02/2023   moderate - shave excision 03/31/2023   Essential hypertension 04/15/2017   GERD (gastroesophageal reflux disease)    Hashimoto's thyroiditis    Headache    not lately   Hypothyroidism    MRSA infection    OSA (obstructive sleep apnea)    CPAP   PONV (postoperative nausea and vomiting)    after tonsillectomy     Past Surgical History:  Procedure Laterality Date   BTS     CARPAL TUNNEL RELEASE Left 11/20/2021   Procedure: LEFT CARPAL TUNNEL RELEASE;  Surgeon: Romona Harari, MD;  Location: [facility];  Service: Orthopedics;  Laterality: Left;   CESAREAN SECTION     COLONOSCOPY WITH PROPOFOL  N/A 03/15/2021   Procedure: COLONOSCOPY WITH PROPOFOL ;  Surgeon: Jinny Carmine, MD;  Location: [facility];  Service: Endoscopy;  Laterality: N/A;   ESOPHAGOGASTRODUODENOSCOPY (EGD) WITH PROPOFOL  N/A 03/15/2021   Procedure: ESOPHAGOGASTRODUODENOSCOPY (EGD) and dillation WITH PROPOFOL ;  Surgeon: Jinny Carmine, MD;  Location: [facility];  Service: Endoscopy;  Laterality: N/A;  sleep apnea requests early   ESOPHAGOGASTRODUODENOSCOPY (EGD) WITH PROPOFOL  N/A 12/04/2022   Procedure: ESOPHAGOGASTRODUODENOSCOPY (EGD) WITH PROPOFOL ;  Surgeon: Jinny Carmine, MD;  Location: [facility];  Service: Endoscopy;  Laterality: N/A;    TONSILLECTOMY     uterine ablation  2005    Medications  Current Outpatient Medications on File Prior to Visit  Medication Sig Dispense Refill   carvedilol  (COREG ) 25 MG tablet Take 1 tablet (25 mg total) by mouth 2 (two) times daily with a meal. 180 tablet 3   chlorthalidone  (HYGROTON ) 25 MG tablet Take 1 tablet (25 mg total) by mouth daily. 30 tablet 3   fluticasone  (FLONASE ) 50 MCG/ACT nasal spray Place 2 sprays into both nostrils daily. 16 g 6   furosemide  (LASIX ) 40 MG tablet Take 1 tablet (40 mg total) by mouth daily. 30 tablet 3   potassium chloride  (KLOR-CON  10) 10 MEQ tablet Take 2 tabs with every dose of Lasix . 60 tablet 2   rosuvastatin  (CRESTOR ) 10 MG tablet Take 1 tablet (10 mg total) by mouth daily. 90 tablet 3   Vonoprazan Fumarate  (VOQUEZNA ) 10 MG TABS Take one tablet (10 mg) by mouth daily. 30 tablet 6    Allergies No Known Allergies  Review of Systems: Constitutional:  no unexpected weight changes Eye:  no recent significant change in vision Ear/Nose/Mouth/Throat:  Ears:  no recent change in hearing Nose/Mouth/Throat:  no complaints of nasal congestion, no sore throat Cardiovascular: no chest pain Respiratory:  no shortness of breath Gastrointestinal:  no abdominal pain, no change in bowel habits GU:  Female: negative for dysuria or pelvic pain Musculoskeletal/Extremities:  no pain of the joints Integumentary (Skin/Breast):  no abnormal skin lesions reported Neurologic:  no headaches Endocrine:  denies fatigue  Exam BP 128/80 (BP Location: Left Arm, Patient  Position: Sitting)   Pulse 68   Temp 97.7 F (36.5 C) (Oral)   Resp 16   Ht 5' 6 (1.676 m)   Wt 269 lb (122 kg)   SpO2 97%   BMI 43.42 kg/m  General:  well developed, well nourished, in no apparent distress Skin:  no significant moles, warts, or growths Head:  no masses, lesions, or tenderness Eyes:  pupils equal and round, sclera anicteric without injection Ears:  canals without lesions, TMs  shiny without retraction, no obvious effusion, no erythema Nose:  nares patent, mucosa normal, and no drainage  Throat/Pharynx:  lips and gingiva without lesion; tongue and uvula midline; non-inflamed pharynx; no exudates or postnasal drainage Neck: neck supple without adenopathy, thyromegaly, or masses Lungs:  clear to auscultation, breath sounds equal bilaterally, no respiratory distress Cardio:  regular rate and rhythm, no LE edema Abdomen:  abdomen soft, nontender; bowel sounds normal; no masses or organomegaly Genital: Defer to GYN Musculoskeletal:  symmetrical muscle groups noted without atrophy or deformity Extremities:  no clubbing, cyanosis, or edema, no deformities, no skin discoloration Neuro:  gait normal; deep tendon reflexes normal and symmetric Psych: well oriented with normal range of affect and appropriate judgment/insight  Assessment and Plan  Well adult exam - Plan: CBC, Comprehensive metabolic panel with GFR, Lipid panel, Hepatitis B surface antibody,quantitative  Hypothyroidism due to Hashimoto thyroiditis - Plan: TSH, T4, free   Well 58 y.o. female. Counseled on diet and exercise.  Add weight resistance exercise. Advanced directive form requested today.  Shingrix rec'd.  PCV20 politely declined.  Other orders as above. Follow up in 6 mo. The patient voiced understanding and agreement to the plan.  Mabel Mt Leland, DO 02/05/24 10:47 AM

## 2024-02-05 NOTE — Patient Instructions (Addendum)
 Give us  2-3 business days to get the results of your labs back.   Keep the diet clean and stay active.  Please consider adding some weight resistance exercise to your routine. Consider yoga as well.   The Shingrix vaccine (for shingles) is a 2 shot series spaced 2-6 months apart. It can make people feel low energy, achy and almost like they have the flu for 48 hours after injection. 1/5 people can have nausea and/or vomiting. Please plan accordingly when deciding on when to get this shot. Call our office for a nurse visit appointment to get this. The second shot of the series is less severe regarding the side effects, but it still lasts 48 hours.   Let us  know if you need anything.

## 2024-02-06 LAB — HEPATITIS B SURFACE ANTIBODY, QUANTITATIVE: Hep B S AB Quant (Post): <5 m[IU]/mL — ABNORMAL LOW (ref >=10)

## 2024-02-08 ENCOUNTER — Encounter: Payer: Self-pay | Admitting: Family Medicine

## 2024-02-08 ENCOUNTER — Telehealth: Payer: Self-pay

## 2024-02-08 NOTE — Telephone Encounter (Signed)
 Pt called and lvm to return call to advise she can get immunization at pharmacy or in office as a nurse visit ( orders are in ) , ok for e2c2 to relay message    Copied from CRM #[id number]. Topic: Appointments - Scheduling Inquiry for Clinic >> Feb 08, 2024 11:14 AM Mesmerise C wrote: Reason for CRM: Patient inquiring if she can get her Hep B inquiring if she can get it at another lab or at a pharmacy instead or would she need to come in office to do it patient would like to be notified through her MyChart

## 2024-02-08 NOTE — Telephone Encounter (Signed)
 Copied from CRM #[id number]. Topic: Clinical - Request for Lab/Test Order >> Feb 08, 2024  1:16 PM Robinson H wrote: Reason for CRM: Patient called to schedule nurse visit appointment per note in system from office for Hep B vaccine, no order in system. Patient also wants to know if the vaccine is a series of shots or one and done.  Gretchen [phone]

## 2024-02-08 NOTE — Telephone Encounter (Signed)
 Done

## 2024-02-08 NOTE — Telephone Encounter (Signed)
 Copied from CRM #[id number]. Topic: Appointments - Scheduling Inquiry for Clinic >> Feb 08, 2024 11:14 AM Mesmerise C wrote: Reason for CRM: Patient inquiring if she can get her Hep B inquiring if she can get it at another lab or at a pharmacy instead or would she need to come in office to do it patient would like to be notified through her MyChart Sent pt message letting her know it can be nurse visit.

## 2024-02-09 ENCOUNTER — Other Ambulatory Visit (INDEPENDENT_AMBULATORY_CARE_PROVIDER_SITE_OTHER): Payer: Self-pay

## 2024-02-09 ENCOUNTER — Telehealth (HOSPITAL_COMMUNITY): Payer: Self-pay

## 2024-02-09 ENCOUNTER — Other Ambulatory Visit (HOSPITAL_COMMUNITY): Payer: Self-pay

## 2024-02-09 ENCOUNTER — Other Ambulatory Visit: Payer: Self-pay

## 2024-02-09 DIAGNOSIS — R8290 Unspecified abnormal findings in urine: Secondary | ICD-10-CM | POA: Diagnosis not present

## 2024-02-10 ENCOUNTER — Other Ambulatory Visit (HOSPITAL_COMMUNITY): Payer: Self-pay

## 2024-02-10 ENCOUNTER — Telehealth (HOSPITAL_COMMUNITY): Payer: Self-pay

## 2024-02-10 NOTE — Telephone Encounter (Signed)
 Pharmacy Patient Advocate Encounter  Received notification from [facility] that Prior Authorization for Voquezna  10MG  tablets  has been APPROVED from 02/10/24 to 03/09/24. Ran test claim, Copay is $25 (with manufacturer coupon). This test claim was processed through [facility]- copay amounts may vary at other pharmacies due to pharmacy/plan contracts, or as the patient moves through the different stages of their insurance plan.   PA #/Case ID/Reference #: [id number]

## 2024-02-10 NOTE — Telephone Encounter (Signed)
 Pharmacy Patient Advocate Encounter   Received notification from Pt Calls Messages that prior authorization for Voquezna  10MG  tablets  is required/requested.   Insurance verification completed.   The patient is insured through [facility] .   Per test claim: PA required; PA submitted to above mentioned insurance via CoverMyMeds Key/confirmation #/EOC [id number] Status is pending

## 2024-02-10 NOTE — Telephone Encounter (Signed)
 PA request has been Received. New Encounter has been or will be created for follow up. For additional info see Pharmacy Prior Auth telephone encounter from 02/10/24.

## 2024-02-11 ENCOUNTER — Ambulatory Visit (INDEPENDENT_AMBULATORY_CARE_PROVIDER_SITE_OTHER)

## 2024-02-11 ENCOUNTER — Other Ambulatory Visit

## 2024-02-11 DIAGNOSIS — Z23 Encounter for immunization: Secondary | ICD-10-CM | POA: Diagnosis not present

## 2024-02-11 DIAGNOSIS — R8290 Unspecified abnormal findings in urine: Secondary | ICD-10-CM

## 2024-02-11 NOTE — Progress Notes (Signed)
 Pt here today for Hep B vaccine per Dr. Wendling  Heplivsav- B injected into L deltoid IM. Pt tolerated injection well.   Next dose in 1 month.

## 2024-02-12 LAB — URINE CULTURE
MICRO NUMBER:: 16800853
SPECIMEN QUALITY:: ADEQUATE

## 2024-02-13 ENCOUNTER — Ambulatory Visit: Payer: Self-pay | Admitting: Family Medicine

## 2024-02-18 ENCOUNTER — Encounter: Payer: Self-pay | Admitting: Obstetrics and Gynecology

## 2024-02-18 ENCOUNTER — Other Ambulatory Visit (HOSPITAL_BASED_OUTPATIENT_CLINIC_OR_DEPARTMENT_OTHER): Payer: Self-pay

## 2024-02-18 ENCOUNTER — Ambulatory Visit: Admitting: Obstetrics and Gynecology

## 2024-02-18 VITALS — BP 142/84 | HR 83 | Ht 66.0 in | Wt 273.0 lb

## 2024-02-18 DIAGNOSIS — Z1331 Encounter for screening for depression: Secondary | ICD-10-CM

## 2024-02-18 DIAGNOSIS — R3915 Urgency of urination: Secondary | ICD-10-CM

## 2024-02-18 DIAGNOSIS — Z01419 Encounter for gynecological examination (general) (routine) without abnormal findings: Secondary | ICD-10-CM | POA: Diagnosis not present

## 2024-02-18 LAB — POCT URINALYSIS DIPSTICK
Bilirubin, UA: NEGATIVE
Blood, UA: NEGATIVE
Glucose, UA: NEGATIVE
Ketones, UA: NEGATIVE
Leukocytes, UA: NEGATIVE
Nitrite, UA: NEGATIVE
Protein, UA: NEGATIVE
Spec Grav, UA: 1.02 (ref 1.010–1.025)
Urobilinogen, UA: 0.2 U/dL
pH, UA: 5 (ref 5.0–8.0)

## 2024-02-18 MED ORDER — CEFADROXIL 500 MG PO CAPS
500.0000 mg | ORAL_CAPSULE | Freq: Two times a day (BID) | ORAL | 0 refills | Status: DC
  Filled 2024-02-18: qty 14, 7d supply, fill #0

## 2024-02-18 NOTE — Progress Notes (Signed)
 ANNUAL EXAM Patient name: Lindsay Wade MRN [id number]  Date of birth: [birth year] Chief Complaint:   Gynecologic Exam (Urinating urgency, burning)  History of Present Illness:   Lindsay Wade is a 58 y.o. G71P2002 female being seen today for a routine annual exam.   Current concerns: Recent UTI like symptoms. Took some ABX leftover at home and had a response but incomplete. ABX was Keflex . She took it about a week ago.    No LMP recorded. Patient is postmenopausal.   Last MXR: 09/2023 Last Pap/Pap History:  12/20218: Normal/HPV neg 10/2019: Normal/HPV neg 12/2022: Normal/HPV neg  Review of Systems:   Pertinent items are noted in HPI Denies any headaches, blurred vision, fatigue, shortness of breath, chest pain, abdominal pain, abnormal vaginal discharge/itching/odor/irritation, problems with periods, bowel movements, urination, or intercourse unless otherwise stated above.  Pertinent History Reviewed:  Reviewed past medical,surgical, social and family history.  Reviewed problem list, medications and allergies. Physical Assessment:   Vitals:   02/18/24 1054 02/18/24 1102  BP: (!) 130/90 (!) 142/84  Pulse: (!) 51 83  Weight: 273 lb (123.8 kg)   Height: 5' 6 (1.676 m)   Body mass index is 44.06 kg/m.   Physical Examination:  General appearance - well appearing, and in no distress Mental status - alert, oriented to person, place, and time Psych:  She has a normal mood and affect Skin - warm and dry, normal color, no suspicious lesions noted Chest - effort normal Heart - normal rate  Breasts - breasts appear normal, no suspicious masses, no skin or nipple changes or axillary nodes Abdomen - soft, nontender, nondistended, no masses or organomegaly Pelvic -  not indicated VULVA: Not examined VAGINA: Not examined CERVIX: Not examined UTERUS: Not examined ADNEXA: Not examined Extremities:  No swelling or varicosities noted  Chaperone present for exam  Results  for orders placed or performed in visit on 02/09/24 (from the past 24 hours)  POCT Urinalysis Dipstick   Collection Time: 02/18/24 11:06 AM  Result Value Ref Range   Color, UA     Clarity, UA     Glucose, UA Negative Negative   Bilirubin, UA neg    Ketones, UA neg    Spec Grav, UA 1.020 1.010 - 1.025   Blood, UA neg    pH, UA 5.0 5.0 - 8.0   Protein, UA Negative Negative   Urobilinogen, UA 0.2 0.2 or 1.0 E.U./dL   Nitrite, UA neg    Leukocytes, UA Negative Negative   Appearance     Odor      Assessment & Plan:  [name] was seen today for gynecologic exam.  Diagnoses and all orders for this visit:  Encounter for annual routine gynecological examination - Cervical cancer screening: Discussed guidelines. Pap with HPV wnl 02/2023.  - Breast Health: Encouraged self breast awareness/SBE. Discussed limits of clinical breast exam for detecting breast cancer. Discussed importance of annual MXR. MXR is up to date: 09/2023 - Climacteric/Sexual health: Reviewed typical and atypical symptoms of menopause/peri-menopause. Discussed PMB and to call if any amount of spotting.  - Colonoscopy: Per PCP - F/U 12 months and prn  Urinary urgency Will do treatment with Duricef. If still symptomatic and Ucx negative - we will do Vaginal estrogen. She will let me know.  -     Urine Culture -     cefadroxil  (DURICEF) 500 MG capsule; Take 1 capsule (500 mg total) by mouth 2 (two) times daily.   Orders Placed This  Encounter  Procedures   Urine Culture    Meds:  Meds ordered this encounter  Medications   cefadroxil  (DURICEF) 500 MG capsule    Sig: Take 1 capsule (500 mg total) by mouth 2 (two) times daily.    Dispense:  14 capsule    Refill:  0    Follow-up: Return in about 2 years (around 02/17/2026) for annual.  Vina Solian, MD 02/18/2024 11:32 AM

## 2024-02-20 LAB — SPECIMEN STATUS REPORT

## 2024-02-20 LAB — URINE CULTURE

## 2024-02-22 ENCOUNTER — Other Ambulatory Visit (HOSPITAL_BASED_OUTPATIENT_CLINIC_OR_DEPARTMENT_OTHER): Payer: Self-pay

## 2024-02-22 MED ORDER — CEFADROXIL 500 MG PO CAPS: 500.0000 mg | ORAL_CAPSULE | Freq: Two times a day (BID) | ORAL | 0 refills | Status: DC

## 2024-02-22 NOTE — Addendum Note (Signed)
 Addended by: ORLINDA SILVANO ORN on: 02/22/2024 11:01 AM   Modules accepted: Orders

## 2024-02-26 ENCOUNTER — Other Ambulatory Visit (HOSPITAL_COMMUNITY): Payer: Self-pay

## 2024-02-26 ENCOUNTER — Ambulatory Visit: Admitting: Family Medicine

## 2024-02-28 DIAGNOSIS — G4733 Obstructive sleep apnea (adult) (pediatric): Secondary | ICD-10-CM | POA: Diagnosis not present

## 2024-03-18 ENCOUNTER — Ambulatory Visit (INDEPENDENT_AMBULATORY_CARE_PROVIDER_SITE_OTHER)

## 2024-03-18 DIAGNOSIS — Z23 Encounter for immunization: Secondary | ICD-10-CM | POA: Diagnosis not present

## 2024-03-18 NOTE — Progress Notes (Signed)
 Patient here today for second hep b , per Dr.Wendling orders  Patient tolerated vaccine well.

## 2024-03-30 DIAGNOSIS — G4733 Obstructive sleep apnea (adult) (pediatric): Secondary | ICD-10-CM | POA: Diagnosis not present

## 2024-03-31 ENCOUNTER — Other Ambulatory Visit: Payer: Self-pay

## 2024-03-31 ENCOUNTER — Other Ambulatory Visit: Payer: Self-pay | Admitting: Family Medicine

## 2024-03-31 DIAGNOSIS — I10 Essential (primary) hypertension: Secondary | ICD-10-CM

## 2024-04-01 ENCOUNTER — Other Ambulatory Visit: Payer: Self-pay

## 2024-04-01 MED ORDER — CHLORTHALIDONE 25 MG PO TABS
25.0000 mg | ORAL_TABLET | Freq: Every day | ORAL | 3 refills | Status: AC
  Filled 2024-04-01: qty 30, 30d supply, fill #0
  Filled 2024-05-13: qty 30, 30d supply, fill #1
  Filled 2024-06-13: qty 30, 30d supply, fill #2
  Filled 2024-07-14: qty 30, 30d supply, fill #3

## 2024-04-06 ENCOUNTER — Encounter: Payer: Self-pay | Admitting: Family Medicine

## 2024-04-06 ENCOUNTER — Other Ambulatory Visit: Payer: Self-pay

## 2024-04-06 DIAGNOSIS — M545 Low back pain, unspecified: Secondary | ICD-10-CM

## 2024-04-13 DIAGNOSIS — M545 Low back pain, unspecified: Secondary | ICD-10-CM | POA: Diagnosis not present

## 2024-04-14 ENCOUNTER — Telehealth (HOSPITAL_COMMUNITY): Payer: Self-pay | Admitting: Pharmacy Technician

## 2024-04-14 ENCOUNTER — Other Ambulatory Visit (HOSPITAL_COMMUNITY): Payer: Self-pay

## 2024-04-15 ENCOUNTER — Other Ambulatory Visit: Payer: Self-pay

## 2024-04-15 ENCOUNTER — Telehealth (HOSPITAL_COMMUNITY): Payer: Self-pay

## 2024-04-15 ENCOUNTER — Other Ambulatory Visit (HOSPITAL_COMMUNITY): Payer: Self-pay

## 2024-04-15 NOTE — Telephone Encounter (Signed)
 Pharmacy Patient Advocate Encounter   Received notification from Pt Calls Messages that prior authorization for Voquezna  10MG  tablets  is required/requested.   Insurance verification completed.   The patient is insured through [facility].   Per test claim: PA required; However, NEW/RECENT labs/notes are needed to complete & submit PA request. Please see below.  *need updated chart notes

## 2024-04-18 ENCOUNTER — Encounter: Payer: Self-pay | Admitting: Pharmacist

## 2024-04-18 ENCOUNTER — Other Ambulatory Visit (HOSPITAL_COMMUNITY): Payer: Self-pay

## 2024-04-18 ENCOUNTER — Other Ambulatory Visit: Payer: Self-pay

## 2024-04-27 ENCOUNTER — Telehealth: Payer: Self-pay

## 2024-04-27 NOTE — Telephone Encounter (Signed)
 Awesome, it was still showing as pending on my end, which is why I asked about it, Thank you!

## 2024-04-27 NOTE — Telephone Encounter (Signed)
 Approved on October 20 by [employer] 2017 The request has been approved. The authorization is effective from 04/25/2024

## 2024-04-27 NOTE — Telephone Encounter (Signed)
 Can I please get an update on this?

## 2024-04-29 DIAGNOSIS — M545 Low back pain, unspecified: Secondary | ICD-10-CM | POA: Diagnosis not present

## 2024-05-09 ENCOUNTER — Encounter: Payer: Self-pay | Admitting: Radiology

## 2024-05-13 ENCOUNTER — Other Ambulatory Visit: Payer: Self-pay

## 2024-05-13 ENCOUNTER — Other Ambulatory Visit (HOSPITAL_COMMUNITY): Payer: Self-pay

## 2024-05-13 ENCOUNTER — Other Ambulatory Visit: Payer: Self-pay | Admitting: Gastroenterology

## 2024-05-13 MED ORDER — VOQUEZNA 10 MG PO TABS
10.0000 mg | ORAL_TABLET | Freq: Every day | ORAL | 2 refills | Status: AC
  Filled 2024-05-13: qty 30, 30d supply, fill #0
  Filled 2024-06-13: qty 30, 30d supply, fill #1
  Filled 2024-07-14: qty 30, 30d supply, fill #2

## 2024-05-14 ENCOUNTER — Other Ambulatory Visit (HOSPITAL_COMMUNITY): Payer: Self-pay

## 2024-05-16 ENCOUNTER — Other Ambulatory Visit: Payer: Self-pay

## 2024-05-17 ENCOUNTER — Other Ambulatory Visit: Payer: Self-pay

## 2024-05-20 DIAGNOSIS — M545 Low back pain, unspecified: Secondary | ICD-10-CM | POA: Diagnosis not present

## 2024-06-09 ENCOUNTER — Telehealth: Payer: Self-pay | Admitting: Family Medicine

## 2024-06-09 NOTE — Telephone Encounter (Signed)
 ..  Pt understands that although there may be some limitations with this type of visit, we will take all precautions to reduce any security or privacy concerns.  Pt understands that this will be treated like an in office visit and we will file with pt's insurance, and there may be a patient responsible charge related to this service. ? ?

## 2024-06-14 ENCOUNTER — Encounter: Payer: Self-pay | Admitting: Family Medicine

## 2024-06-14 ENCOUNTER — Other Ambulatory Visit: Payer: Self-pay

## 2024-06-14 ENCOUNTER — Other Ambulatory Visit (HOSPITAL_COMMUNITY): Payer: Self-pay

## 2024-06-14 ENCOUNTER — Telehealth: Payer: Self-pay

## 2024-06-14 DIAGNOSIS — R140 Abdominal distension (gaseous): Secondary | ICD-10-CM

## 2024-06-14 DIAGNOSIS — K219 Gastro-esophageal reflux disease without esophagitis: Secondary | ICD-10-CM

## 2024-06-14 NOTE — Telephone Encounter (Signed)
 I got another request for this medication and I did a test claim and it is coming up as a PA again. Do you have the approval dates?

## 2024-06-14 NOTE — Telephone Encounter (Signed)
 Per pt will get PCP to referral her to someone closer to [place]. Pt doesn't want to drive this far to be seen.

## 2024-06-15 ENCOUNTER — Other Ambulatory Visit (HOSPITAL_COMMUNITY): Payer: Self-pay

## 2024-06-15 NOTE — Progress Notes (Signed)
 PATIENT: Lindsay Wade DOB: 03/03/1966  REASON FOR VISIT: follow up HISTORY FROM: patient  Virtual Visit via MyChart video  I connected with Lindsay Wade on 06/16/2024 at  9:30 AM EST via MyChart video and verified that I am speaking with the correct person using two identifiers.   I discussed the limitations, risks, security and privacy concerns of performing an evaluation and management service by Mychart video and the availability of in person appointments. I also discussed with the patient that there may be a patient responsible charge related to this service. The patient expressed understanding and agreed to proceed.   History of Present Illness:  06/16/2024 ALL (MyChart): Lindsay Wade is a 58 y.o. female here today for follow up for OSA on BiPAP. She continues to do well. She received her new BiPAP 07/31/2023. She is using therapy nightly. She did note more apneic events while on vacation in 02/2024. She reports taking muscle relaxers due to more pain from walking. She is planning to resume regualr exercise and concerned AHI may increase. She is feeling well.     04/07/23 ALL: Lindsay Wade is a 58 y.o. female here today for follow up for OSA on BiPAP.  She was last seen by Dr Chalice 03/2021 and doing well on BiPAP therapy. Pressures were adjusted to 18/13 at that visit. Since, she reports doing well on therapy. She uses BiPAP nightly. She does report continued snoring. She uses a nasal pillow mask and is a mouth breather. She can not tolerate CPAP tape or chin strap. She is followed regularly by PCP.     HISTORY: (copied from Dr Dohmeier's previous notes)  HPI:  Lindsay Wade is a married, right handed 58 y.o. female here for a follow up after 3 years of absence. He patient was diagnosed with sleep apnea.  Her headaches and BP problem resolved soon after BiPAP was initiated.    The patient underwent on 12-28 2018 a split-night polysomnography study.   The baseline study showed a latency of only 9 minutes before she entered sleep and she had a sufficient sleep efficiency she could not enter REM sleep however.  There was no N3 stage sleep either there were 117 respiratory events with an AHI of 61.9/h so this was severe sleep apnea and the vast majority of her events were hypopneas.  In supine sleep there was actually less AHI is seen then in nonsupine sleep.  And her non-REM AHI was 61.9.  She was initiated to CPAP with an initial pressure of 5 cmH2O at the highest CPAP level still had a residual AHI so the technician changed her that night to a BiPAP the best result was seen at 16/12 cmH2O and she reached a sleep efficiency of 91.7%.  The patient switched from a nasal interface to a full facemask with nasal pillow she did not have periodic limb movements at night.  She did not have hypoxia after CPAP was initiated.  Almost 4 years later she is actually doing very well she has seen our nurse practitioners Aleck Lunger and Bama Hanselman in the interval.  And has been highly compliant with CPAP.  She is concerned now because her husband has noticed that she snores during the mask.  She also noted that when she changed headgear there was a better seal and a lower AHI indicated in the mornings however at this time she still 97% compliant she had only 1 day that she skipped and her average  user time is 8 hours and 1 minutes the settings are 16/12 cmH2O the AHI residual is 4.3.  Mostly hypopneas.  There is literally no air leakage recorded so I am not sure if she can snore through the F 30 I mask refill mother mouth covering but under the nose fit.   She has gained more weight: she sleeps much better with PP and considers herself CPAP dependent.    Weight to 270 # BMI 43.5 kg/ m2.    Patent of southern [place] descent , who was first seen here upon referral  from Dr. Ines for a sleep consultation in 2018. Chief complaint according to patient :  I have  bad, bad headaches in the morning and high blood pressure.    Lindsay Wade presents today as a new patient to the sleep clinic but an established headache patient of Dr. Ines.  She reports that over the last 3 months she had escalating headaches which bothered her mostly in the morning.  For a while it was thought that if her blood pressure is better controlled her headaches would go away but this was not the case.  Dr. Ines obtained an MRI of the brain and also cervical spine images which returned normal.  The patient had mentioned that her mother suffered from meningioma.  The Patient also describes that she feels hot as if having a fever or viral infection, she feels weak and tired and would like to rest with her headaches.  The headaches are present in the morning but they do not wake her from sleep.  Sometimes a few more tension-like other times pounding and pulsatile, she is not bothered by movement light or loud sounds.  She is not squeezy.  There has been no dizziness, no shortness of breath and no chest pain. Her husband has told her that she is a family with snoring and 1 of our avenues to evaluate her headache will be to rule out sleep apnea.   Lindsay Wade informed me that she had previously been diagnosed with sleep apnea in August 2014 in [place], [place] .  [facility] for sleep studies.  I have only a copy for a CPAP titration which does not the results of the baseline sleep study.  It was a baseline RDI and not AHI.  13.2/h with an Epworth sleepiness score at the time endorsed at 13 points.  CPAP titration explored pressures beginning at 7 cm and changed finally to BiPAP at 10/6 centimeter water .  The patient was deemed intolerant despite use of various mask styles, types and sizes.  The patient requested to end the study prematurely and left the lab.   Sleep habits are as follows: Her husband has moved on to another bedroom as he is bothered by her snoring.  The  usual bedtime for the patient is 10:30 PM and she does not have trouble initiating sleep.  She had a fullness feeling in her throat, felt that her airway was partially blocked and began sleeping more reclined rather than flat.  This has helped.  She sleeps on her bed- but now in supine on one pillow.  She describes her bedroom is cool, quiet and dark - conducive to sleep. She gets up twice to go to the bathroom, around 4 and 6 AM. She rises at 6.30 AM spontaneously, on weekends until 8 AM. Daytime naps are unscheduled- and she falls asleep after lunch.   Sleep medical history and family sleep history: To the  patient's knowledge there is no family member that was diagnosed with apnea, but there may be family members that have it.  Her mother snores very loudly.  The patient had no childhood history of sleepwalking, night terrors or enuresis. She had a tonsillectomy at age 43.   Social history:  Married, mother of 2 sons, 73 and 38 years old. Never been a Smoker, ETOH rarely. Caffeine: 3 cups of 8 ounces of coffee in AM, no iced tea, no energy drinks, once a week a coke. No history of night shifts. 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 She moved here from [facility] December 2016 .    Observations/Objective:  Generalized: Well developed, in no acute distress  Mentation: Alert oriented to time, place, history taking. Follows all commands speech and language fluent   Assessment and Plan:  58 y.o. year old female  has a past medical history of Atypical nevus (02/02/2023), Dysplastic nevus (02/02/2023), Essential hypertension (04/15/2017), GERD (gastroesophageal reflux disease), Hashimoto's thyroiditis, Headache, Hypothyroidism, MRSA infection, OSA (obstructive sleep apnea), and PONV (postoperative nausea and vomiting). here with    ICD-10-CM   1. OSA treated with BiPAP  G47.33 For home use only DME Bipap     Denece is doing well on therapy.  Compliance report shows excellent compliance. We will recheck download after she resumes exercise regimen to insure pressure settings do not need to be adjusted. She was encouraged to continue using therapy nightly for at least four hours. She will continue healthy lifestyle habits. She will follow up with me in 1 year, sooner if needed.    Orders Placed This Encounter  Procedures   For home use only DME Bipap    Supplies    Length of Need:   Lifetime    Inspiratory pressure:   OTHER SEE COMMENTS    Expiratory pressure:   OTHER SEE COMMENTS    No orders of the defined types were placed in this encounter.    Follow Up Instructions:  I discussed the assessment and treatment plan with the patient. The patient was provided an opportunity to ask questions and all were answered. The patient agreed with the plan and demonstrated an understanding of the instructions.   The patient was advised to call back or seek an in-person evaluation if the symptoms worsen or if the condition fails to improve as anticipated.  I provided 15 minutes of face-to-face and non face-to-face time during this MyChart video encounter. Patient located at their place of residence. Provider is in the office.    Ibrahem Volkman, NP

## 2024-06-15 NOTE — Patient Instructions (Signed)

## 2024-06-16 ENCOUNTER — Telehealth: Admitting: Family Medicine

## 2024-06-16 ENCOUNTER — Encounter: Payer: Self-pay | Admitting: Family Medicine

## 2024-06-16 DIAGNOSIS — M7918 Myalgia, other site: Secondary | ICD-10-CM | POA: Diagnosis not present

## 2024-06-16 DIAGNOSIS — G4733 Obstructive sleep apnea (adult) (pediatric): Secondary | ICD-10-CM

## 2024-06-16 NOTE — Progress Notes (Signed)
 Lindsay Wade

## 2024-07-14 ENCOUNTER — Other Ambulatory Visit (HOSPITAL_COMMUNITY): Payer: Self-pay

## 2024-07-15 ENCOUNTER — Other Ambulatory Visit (HOSPITAL_COMMUNITY): Payer: Self-pay

## 2024-07-22 ENCOUNTER — Ambulatory Visit (HOSPITAL_BASED_OUTPATIENT_CLINIC_OR_DEPARTMENT_OTHER): Attending: Orthopedic Surgery | Admitting: Physical Therapy

## 2024-07-22 ENCOUNTER — Other Ambulatory Visit: Payer: Self-pay

## 2024-07-22 ENCOUNTER — Encounter (HOSPITAL_BASED_OUTPATIENT_CLINIC_OR_DEPARTMENT_OTHER): Payer: Self-pay | Admitting: Physical Therapy

## 2024-07-22 DIAGNOSIS — R293 Abnormal posture: Secondary | ICD-10-CM | POA: Insufficient documentation

## 2024-07-22 DIAGNOSIS — M546 Pain in thoracic spine: Secondary | ICD-10-CM | POA: Insufficient documentation

## 2024-07-22 DIAGNOSIS — M5459 Other low back pain: Secondary | ICD-10-CM | POA: Insufficient documentation

## 2024-07-22 DIAGNOSIS — M545 Low back pain, unspecified: Secondary | ICD-10-CM | POA: Diagnosis present

## 2024-07-22 NOTE — Therapy (Unsigned)
 " OUTPATIENT PHYSICAL THERAPY THORACOLUMBAR EVALUATION   Patient Name: Lindsay Wade MRN: [id number] DOB:May 27, 1966, 59 y.o., female Today's Date: 07/22/2024  END OF SESSION:  PT End of Session - 07/22/24 1309     Visit Number 1    Date for Recertification  09/16/24    Authorization Type aetna    PT Start Time 1017    PT Stop Time 1159    PT Time Calculation (min) 102 min    Activity Tolerance Patient tolerated treatment well    Behavior During Therapy [facility] for tasks assessed/performed          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 - shave excision 03/31/2023   Dysplastic nevus 02/02/2023   moderate - shave excision 03/31/2023   Essential hypertension 04/15/2017   GERD (gastroesophageal reflux disease)    Hashimoto's thyroiditis    Headache    not lately   Hypothyroidism    MRSA infection    OSA (obstructive sleep apnea)    CPAP   PONV (postoperative nausea and vomiting)    after tonsillectomy   Past Surgical History:  Procedure Laterality Date   BTS     CARPAL TUNNEL RELEASE Left 11/20/2021   Procedure: LEFT CARPAL TUNNEL RELEASE;  Surgeon: Romona Harari, MD;  Location: [facility];  Service: Orthopedics;  Laterality: Left;   CESAREAN SECTION     COLONOSCOPY WITH PROPOFOL  N/A 03/15/2021   Procedure: COLONOSCOPY WITH PROPOFOL ;  Surgeon: Jinny Carmine, MD;  Location: [facility];  Service: Endoscopy;  Laterality: N/A;   ESOPHAGOGASTRODUODENOSCOPY (EGD) WITH PROPOFOL  N/A 03/15/2021   Procedure: ESOPHAGOGASTRODUODENOSCOPY (EGD) and dillation WITH PROPOFOL ;  Surgeon: Jinny Carmine, MD;  Location: [facility];  Service: Endoscopy;  Laterality: N/A;  sleep apnea requests early   ESOPHAGOGASTRODUODENOSCOPY (EGD) WITH PROPOFOL  N/A 12/04/2022   Procedure: ESOPHAGOGASTRODUODENOSCOPY (EGD) WITH PROPOFOL ;  Surgeon: Jinny Carmine, MD;  Location: [facility];  Service: Endoscopy;   Laterality: N/A;   TONSILLECTOMY     uterine ablation  2005   Patient Active Problem List   Diagnosis Date Noted   Abdominal bloating 01/26/2024   Dysphagia 01/26/2024   Acid reflux 01/26/2024   Gastro-esophageal reflux disease without esophagitis 01/26/2024   Morbid (severe) obesity due to excess calories (HCC) 01/26/2024   Low back pain 07/15/2023   Lumbar spondylosis 07/15/2023   Myofascial pain 07/15/2023   OSA treated with BiPAP 06/19/2023   Pure hypercholesterolemia 03/16/2023   Primary osteoarthritis 12/22/2022   Body mass index (BMI) 40.0-44.9, adult (HCC) 12/22/2022   Carpal tunnel syndrome, left upper limb    Screen for colon cancer    Polyp of sigmoid colon    Gastroesophageal reflux disease with esophagitis without hemorrhage    Joint synovitis 07/03/2020   Obstructive sleep apnea treated with BiPAP 10/06/2017   Occipital neuralgia of right side 06/03/2017   Uncontrolled morning headache 05/26/2017   HTN (hypertension), malignant 05/26/2017   Snoring 05/26/2017   Occipital headache 04/24/2017   Essential hypertension 04/15/2017   Hypothyroid     PCP: Frann Netter MD  REFERRING PROVIDER: Donaciano Sprang MD  REFERRING DIAG: M54.50 (ICD-10-CM) - Low back pain, unspecified back pain laterality, unspecified chronicity, unspecified whether sciatica present   Rationale for Evaluation and Treatment: Rehabilitation  THERAPY DIAG:  Pain in thoracic spine  Other low back pain  Abnormal posture  ONSET DATE: few years  SUBJECTIVE:  SUBJECTIVE STATEMENT: Dr Laqueta for past year.  Get injections every 3 month.  Last injection worked so well that I was able to walk 9000 steps after ward. Saw Dr Burnetta and he said I need to lose weight.  I went to therapy about a year ago and she  worked on my hips but it didn't work no reduction in pain.  PERTINENT HISTORY:  multiple trigger point injections in the lumbar spine, RFA's, and the L2-L3 and L3-L4 facet RFA as well as a right sided L2-L3 ESI injection.   PAIN:  Are you having pain? Yes: NPRS scale: current 0/10; worst 8/10 Pain location: right side low thoracic upper lumbar area Pain description: burning, throbbing  Aggravating factors: house cleaning forward flex repetition Relieving factors: sitting leaned forward,  PRECAUTIONS: None  RED FLAGS: None   WEIGHT BEARING RESTRICTIONS: No  FALLS:  Has patient fallen in last 6 months? No  LIVING ENVIRONMENT: Lives with: lives with their family Lives in: House/apartment Stairs: Yes: Internal: 16 steps; on right going up Has following equipment at home: None Carries a 22 LB dog both ways  OCCUPATION: house keeping  PLOF: Independent  PATIENT GOALS: to be able to walk without pain; lose weight, reduce pain  NEXT MD VISIT: March  OBJECTIVE:  Note: Objective measures were completed at Evaluation unless otherwise noted.  DIAGNOSTIC FINDINGS:  11/26 Her MRI does not demonstrate any significant spinal stenosis or significant structural abnormality that would warrant surgical intervention.   IMPRESSION: 2024:Mild foraminal stenosis bilaterally at L2-L3 and L3-L4 and on the left at L5-S1. Mild canal stenosis at L2-L3.  PATIENT SURVEYS:  ODI:14/50=28%  COGNITION: Overall cognitive status: Within functional limits for tasks assessed     SENSATION: WFL  MUSCLE LENGTH: Hamstrings: wfl   POSTURE: rounded shoulders and forward head  large breasts  PALPATION: Moderate TTP T&-T12 paraspinals  LUMBAR ROM:   WFL  LOWER EXTREMITY ROM:     wfl  LOWER EXTREMITY MMT:    MMT Right eval Left eval  Hip flexion 46.2 47.3  Hip extension    Hip abduction 44.6 43.8  Hip adduction    Hip internal rotation    Hip external rotation    Knee flexion     Knee extension    Ankle dorsiflexion    Ankle plantarflexion    Ankle inversion    Ankle eversion     (Blank rows = not tested)  LUMBAR SPECIAL TESTS:  Straight leg raise test: Negative and Slump test: Negative  FUNCTIONAL TESTS:  5 times sit to stand: 9.80 Timed up and go (TUG): 7.33 4 stage balance PAssed  GAIT: Distance walked: 500 ft Assistive device utilized: Single point cane Level of assistance: Complete Independence Comments: normal  TREATMENT  Eval Self care:Posture and body mechanic instruction; activity level; house cleaning; rest periods, sleeping positions  PATIENT EDUCATION:  Education details: Discussed eval findings, rehab rationale, aquatic program progression/POC and pools in area. Patient is in agreement  Person educated: Patient Education method: Explanation Education comprehension: verbalized understanding  HOME EXERCISE PROGRAM: Aquatics tba  ASSESSMENT:  CLINICAL IMPRESSION: Patient is a 59 y.o. f who was seen today for physical therapy evaluation and treatment for back pain. She has had moderate to severe R sided midthoracic pain over the past few years which has progressed. She has seen pain management who recently gave her trigger point injections along mid to lower Rt paraspinals completely eliminating pain allowing her to walk on vacation 9000 steps.  She states some of the pain is slowly creeping back. Able to recreate pain with palpation in area noted above where she has elevated muscle mass likely due to spasm.  Md note does mention a scoliosis which may be a contributing/major factor.  She has had PT last year without any positive results.   Pt presents with pain limited deficits in  (area)        strength, ROM, endurance, activity tolerance, gait, balance, and functional mobility with ADL's. Patient is having to modify  and restrict ADL's as indicated by outcome measure score as well as subjective information and objective measures which is affecting overall participation. Patient will benefit from skilled physical therapy in order to improve function and reduce impairment.     OBJECTIVE IMPAIRMENTS: {opptimpairments:25111}.   ACTIVITY LIMITATIONS: {activitylimitations:27494}  PARTICIPATION LIMITATIONS: {participationrestrictions:25113}  PERSONAL FACTORS: {Personal factors:25162} are also affecting patient's functional outcome.   REHAB POTENTIAL: {rehabpotential:25112}  CLINICAL DECISION MAKING: {clinical decision making:25114}  EVALUATION COMPLEXITY: {Evaluation complexity:25115}   GOALS: Goals reviewed with patient? {yes/no:20286}  SHORT TERM GOALS: Target date: ***  Pt will tolerate full aquatic sessions consistently without increase in pain and with improving function to demonstrate good toleration and effectiveness of intervention.  Baseline: Goal status: INITIAL  2.  *** Baseline:  Goal status: INITIAL  3.  *** Baseline:  Goal status: INITIAL  4.  *** Baseline:  Goal status: INITIAL  5.  *** Baseline:  Goal status: INITIAL  6.  *** Baseline:  Goal status: INITIAL  LONG TERM GOALS: Target date: 09/16/24  Pt to improve on ODI by   % to demonstrate statistically significant Improvement in function. (MCID 13-15%) Baseline: 14/50=28% Goal status: INITIAL  2.  Pt will perform house cleaning with fewer rest periods (<3) and fewer doses od ibuprofen. Baseline:  Goal status: INITIAL  3.  Pt will report ability to tolerate amb for over 1 hour without limitation to pain Baseline:  Goal status: INITIAL  4.  *** Baseline:  Goal status: INITIAL  5.  *** Baseline:  Goal status: INITIAL  6.  *** Baseline:  Goal status: INITIAL  PLAN:  PT FREQUENCY: 1-2x/week  PT DURATION: 8 weeks  PLANNED INTERVENTIONS: 97164- PT Re-evaluation, 97750- Physical Performance Testing,  97110-Therapeutic exercises, 97530- Therapeutic activity, 97112- Neuromuscular re-education, 97535- Self Care, 02859- Manual therapy, [id number]- Gait training, [phone]- Aquatic Therapy, H9716- Electrical stimulation (unattended), [id number]- Electrical stimulation (manual), [id number]- Ionotophoresis 4mg /ml Dexamethasone, 79439 (1-2 muscles), 20561 (3+ muscles)- Dry Needling, Patient/Family education, Balance training, Stair training, Taping, Joint mobilization, DME instructions, Cryotherapy, and Moist heat.  PLAN FOR NEXT SESSION: Ermalinda lot    Frankie Srah Ake, [place] 07/22/2024, 1:11 PM  "

## 2024-07-27 ENCOUNTER — Ambulatory Visit: Payer: Self-pay

## 2024-07-27 NOTE — Telephone Encounter (Signed)
 Pt states she may be a few minutes late, she is coming from another appointment. Pt states she is a Runner, Broadcasting/film/video.    FYI Only or Action Required?: FYI only for provider: appointment scheduled on 1/23.  Patient was last seen in primary care on 02/05/2024 by Frann Mabel Mt, DO.  Called Nurse Triage reporting Otalgia.  Symptoms began a week ago.  Interventions attempted: OTC medications: OTC .  Symptoms are: gradually worsening.  Triage Disposition: See Physician Within 24 Hours  Patient/caregiver understands and will follow disposition?: Yes   Reason for Triage:  Bad left ear pain, no drainage/other symptoms   Reason for Disposition  Earache  (Exceptions: Brief ear pain of lasting less than 60 minutes, or earache occurring during air travel.)  Answer Assessment - Initial Assessment Questions 1. LOCATION: Which ear is involved?     L ear  2. ONSET: When did the ear pain start?      1 week  3. SEVERITY: How bad is the pain?  (Scale 1-10; mild, moderate or severe)     9/10, sharp   4. URI SYMPTOMS: Do you have a runny nose or cough?     Denies  5. FEVER: Do you have a fever? If Yes, ask: What is your temperature, how was it measured, and when did it start?     Denies  6. CAUSE: Have you been swimming recently?, How often do you use Q-TIPS?, Have you had any recent air travel or scuba diving?     Denies  7. OTHER SYMPTOMS: Do you have any other symptoms? (e.g., decreased hearing, dizziness, headache, stiff neck, vomiting)     Denies  Protocols used: Earache-A-AH

## 2024-07-28 ENCOUNTER — Other Ambulatory Visit (HOSPITAL_BASED_OUTPATIENT_CLINIC_OR_DEPARTMENT_OTHER): Payer: Self-pay

## 2024-07-28 ENCOUNTER — Ambulatory Visit: Admitting: Family Medicine

## 2024-07-28 ENCOUNTER — Encounter: Payer: Self-pay | Admitting: Family Medicine

## 2024-07-28 ENCOUNTER — Ambulatory Visit: Payer: Self-pay | Admitting: *Deleted

## 2024-07-28 VITALS — BP 118/78 | HR 64 | Temp 98.8°F | Ht 66.0 in | Wt 273.6 lb

## 2024-07-28 DIAGNOSIS — H6692 Otitis media, unspecified, left ear: Secondary | ICD-10-CM

## 2024-07-28 MED ORDER — AMOXICILLIN-POT CLAVULANATE 875-125 MG PO TABS
1.0000 | ORAL_TABLET | Freq: Two times a day (BID) | ORAL | 0 refills | Status: AC
  Filled 2024-07-28: qty 20, 10d supply, fill #0

## 2024-07-28 MED ORDER — FLUCONAZOLE 150 MG PO TABS
150.0000 mg | ORAL_TABLET | Freq: Once | ORAL | 0 refills | Status: AC
  Filled 2024-07-28: qty 2, 7d supply, fill #0

## 2024-07-28 NOTE — Progress Notes (Signed)
 [employer], [employer] [facility] [employer] [address] [place], [place] [address] [phone]  Date:  07/28/2024   Name:  Lindsay Wade   DOB:  13-Sep-1965   MRN:  [id number]  PCP:  Frann Mabel Mt, DO    Chief Complaint: Ear Pain (L ear onset 2 weeks, I had it in the past but I thought it went away )   History of Present Illness:  Lindsay Wade is a 59 y.o. very pleasant female patient who presents with the following:  Pt seen today with concern of earache Primary pt of Dr Frann I saw her once in 2022  Discussed the use of AI scribe software for clinical note transcription with the patient, who gave verbal consent to proceed.  History of Present Illness Lindsay Wade is a 59 year old female who presents with left ear pain.  She has been experiencing left ear pain for approximately a month and a half. Initially, she self-treated for about 3 days with leftover amoxicillin . The pain subsided temporarily but returned two weeks ago and has progressively worsened, becoming very painful.  She denies any fluid drainage from the ear and reports that her hearing seems fine. No sore throat, cough, or fever. However, she mentions a sensation reminiscent of shingles, which she had on the left side of her scalp as a child, causing her hair to hurt. She is uncertain if this pain is related to her earache.  In the past, she had fluid behind her ear that affected her hearing, and an ENT had suggested puncturing her ear to drain the fluid, which she found to be a painful experience. She does not recall if it was the same ear affected at that time.  She took amoxicillin  for three days during the initial episode, which provided partial relief. She is concerned about the possibility of developing a yeast infection from antibiotics, as she has experienced this in the past.    Patient Active Problem List   Diagnosis Date Noted   Abdominal  bloating 01/26/2024   Dysphagia 01/26/2024   Acid reflux 01/26/2024   Gastro-esophageal reflux disease without esophagitis 01/26/2024   Morbid (severe) obesity due to excess calories (HCC) 01/26/2024   Low back pain 07/15/2023   Lumbar spondylosis 07/15/2023   Myofascial pain 07/15/2023   OSA treated with BiPAP 06/19/2023   Pure hypercholesterolemia 03/16/2023   Primary osteoarthritis 12/22/2022   Body mass index (BMI) 40.0-44.9, adult (HCC) 12/22/2022   Carpal tunnel syndrome, left upper limb    Screen for colon cancer    Polyp of sigmoid colon    Gastroesophageal reflux disease with esophagitis without hemorrhage    Joint synovitis 07/03/2020   Obstructive sleep apnea treated with BiPAP 10/06/2017   Occipital neuralgia of right side 06/03/2017   Uncontrolled morning headache 05/26/2017   HTN (hypertension), malignant 05/26/2017   Snoring 05/26/2017   Occipital headache 04/24/2017   Essential hypertension 04/15/2017   Hypothyroid     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 - shave excision 03/31/2023   Dysplastic nevus 02/02/2023   moderate - shave excision 03/31/2023   Essential hypertension 04/15/2017   GERD (gastroesophageal reflux disease)    Hashimoto's thyroiditis    Headache    not lately   Hypothyroidism    MRSA infection    OSA (obstructive sleep apnea)    CPAP   PONV (postoperative  nausea and vomiting)    after tonsillectomy    Past Surgical History:  Procedure Laterality Date   BTS     CARPAL TUNNEL RELEASE Left 11/20/2021   Procedure: LEFT CARPAL TUNNEL RELEASE;  Surgeon: Romona Harari, MD;  Location: [facility];  Service: Orthopedics;  Laterality: Left;   CESAREAN SECTION     COLONOSCOPY WITH PROPOFOL  N/A 03/15/2021   Procedure: COLONOSCOPY WITH PROPOFOL ;  Surgeon: Jinny Carmine, MD;  Location: [facility];  Service: Endoscopy;  Laterality: N/A;    ESOPHAGOGASTRODUODENOSCOPY (EGD) WITH PROPOFOL  N/A 03/15/2021   Procedure: ESOPHAGOGASTRODUODENOSCOPY (EGD) and dillation WITH PROPOFOL ;  Surgeon: Jinny Carmine, MD;  Location: [facility];  Service: Endoscopy;  Laterality: N/A;  sleep apnea requests early   ESOPHAGOGASTRODUODENOSCOPY (EGD) WITH PROPOFOL  N/A 12/04/2022   Procedure: ESOPHAGOGASTRODUODENOSCOPY (EGD) WITH PROPOFOL ;  Surgeon: Jinny Carmine, MD;  Location: [facility];  Service: Endoscopy;  Laterality: N/A;   TONSILLECTOMY     uterine ablation  2005    Social History[1]  Family History  Problem Relation Age of Onset   Hypertension Mother    Diabetes Mother        TYPE 2    Hyperlipidemia Mother    Thyroid  disease Mother    Hypertension Father    Parkinsonism Father    Hyperlipidemia Father    Stroke Father    Hyperlipidemia Sister    Colon cancer Neg Hx    Stomach cancer Neg Hx    Pancreatic cancer Neg Hx     Allergies[2]  Medication list has been reviewed and updated.  Medications Ordered Prior to Encounter[3]  Review of Systems:  As per HPI- otherwise negative.  Physical Examination: Vitals:   07/28/24 1107  BP: 118/78  Pulse: 64  Temp: 98.8 F (37.1 C)  SpO2: 99%   Vitals:   07/28/24 1107  Weight: 273 lb 9.6 oz (124.1 kg)  Height: 5' 6 (1.676 m)   Body mass index is 44.16 kg/m. Ideal Body Weight: Weight in (lb) to have BMI = 25: 154.6  GEN: no acute distress. Obese, looks well HEENT: Atraumatic, Normocephalic.  Bilateral TM wnl, oropharynx normal.  PEERL,EOMI.  Left TM is dull with some clear fluid and bulging but no redness Examined scalp- no lesions No tenderness over left TA  Ears and Nose: No external deformity. CV: RRR, No M/G/R. No JVD. No thrill. No extra heart sounds. PULM: CTA B, no wheezes, crackles, rhonchi. No retractions. No resp. distress. No accessory muscle use. EXTR: No c/c/e PSYCH: Normally interactive. Conversant.     Assessment and Plan: Left otitis  media, unspecified otitis media type - Plan: amoxicillin -clavulanate (AUGMENTIN ) 875-125 MG tablet, fluconazole  (DIFLUCAN ) 150 MG tablet  Assessment & Plan Left otitis media Recurrent ear pain with fluid behind eardrum, partial relief from initial treatment. Considered bacterial infection, temporal arteritis if no improvement. - Prescribed Augmentin  for 7-10 days, twice daily. - Advised to report if symptoms do not improve in 2-3 days. - Prescribed Diflucan  for potential yeast infection, to be taken as needed and repeated in a week if necessary. - Encouraged consumption of yogurt to prevent yeast infection. - Discussed potential need for further evaluation if symptoms persist, including consideration of CT scan.  Signed Harlene Schroeder, MD     [1]  Social History Tobacco Use   Smoking status: Never    Passive exposure: Never   Smokeless tobacco: Never  Vaping Use   Vaping status: Never Used  Substance Use Topics   Alcohol  use: Not Currently    Comment: social drinker; very rare    Drug use: No  [2] No Known Allergies [3]  Current Outpatient Medications on File Prior to Visit  Medication Sig Dispense Refill   carvedilol  (COREG ) 25 MG tablet Take 1 tablet (25 mg total) by mouth 2 (two) times daily with a meal. 180 tablet 3   chlorthalidone  (HYGROTON ) 25 MG tablet Take 1 tablet (25 mg total) by mouth daily. 30 tablet 3   dexlansoprazole  (DEXILANT ) 60 MG capsule 1 cap(s) orally 20 minutes before breakfast; Duration: 30 days     fluticasone  (FLONASE ) 50 MCG/ACT nasal spray Place 2 sprays into both nostrils daily. 16 g 6   omeprazole (PRILOSEC) 20 MG capsule 1 capsule 30 minutes before morning meal Orally Once a day; Duration: 30 day(s)     potassium chloride  (KLOR-CON  10) 10 MEQ tablet Take 2 tabs with every dose of Lasix . 60 tablet 2   rosuvastatin  (CRESTOR ) 10 MG tablet Take 1 tablet (10 mg total) by mouth daily. 90 tablet 3   SYNTHROID  125 MCG tablet Take 1 tablet (125 mcg total)  by mouth daily before breakfast. 90 tablet 3   Vonoprazan Fumarate  (VOQUEZNA ) 10 MG TABS Take 10 mg by mouth daily. OFFICE VISIT REQUIRED FOR MORE REFILLS 30 tablet 2   lisinopril  (ZESTRIL ) 20 MG tablet 1 tablet Orally Once a day; Duration: 30 day(s) (Patient not taking: Reported on 07/28/2024)     No current facility-administered medications on file prior to visit.   "

## 2024-07-28 NOTE — Telephone Encounter (Signed)
 Appt scheduled

## 2024-07-28 NOTE — Telephone Encounter (Signed)
" ° °  FYI Only or Action Required?: FYI only for provider: appointment scheduled on 07/28/24.  Patient was last seen in primary care on 02/05/2024 by Frann Mabel Mt, DO.  Called Nurse Triage reporting Otalgia.  Symptoms began several weeks ago.  Interventions attempted: OTC medications: advil.  Symptoms are: rapidly worsening.  Triage Disposition: See HCP Within 4 Hours (Or PCP Triage)  Patient/caregiver understands and will follow disposition?: Yes   Patient does aquatic therapy but reports she never goes under water .              Reason for Disposition  [1] SEVERE pain (e.g., excruciating) and [2] not improved 2 hours after pain medicine (e.g., acetaminophen  or ibuprofen)  Answer Assessment - Initial Assessment Questions Appt scheduled today with other provider due to worsening severe pain left ear area. Reports hair hurts hx shingles years ago . Recommended if fever or worsening sx may need UC / ED.      1. LOCATION: Which ear is involved?     Left ear 2. ONSET: When did the ear pain start?      Couple of weeks  3. SEVERITY: How bad is the pain?  (Scale 1-10; mild, moderate or severe)     Severe at times  4. URI SYMPTOMS: Do you have a runny nose or cough?     no 5. FEVER: Do you have a fever? If Yes, ask: What is your temperature, how was it measured, and when did it start?     na 6. CAUSE: Have you been swimming recently?, How often do you use Q-TIPS?, Have you had any recent air travel or scuba diving?     na 7. OTHER SYMPTOMS: Do you have any other symptoms? (e.g., decreased hearing, dizziness, headache, stiff neck, vomiting)     Hair hurts at times , left ear pain. No fever no stiff neck no dizziness no rash  8. PREGNANCY: Is there any chance you are pregnant? When was your last menstrual period?     na  Protocols used: Earache-A-AH  "

## 2024-07-29 ENCOUNTER — Ambulatory Visit (HOSPITAL_BASED_OUTPATIENT_CLINIC_OR_DEPARTMENT_OTHER): Admitting: Physical Therapy

## 2024-07-29 ENCOUNTER — Ambulatory Visit: Admitting: Student

## 2024-07-29 DIAGNOSIS — M546 Pain in thoracic spine: Secondary | ICD-10-CM | POA: Diagnosis not present

## 2024-07-29 DIAGNOSIS — M6281 Muscle weakness (generalized): Secondary | ICD-10-CM

## 2024-07-29 DIAGNOSIS — M5459 Other low back pain: Secondary | ICD-10-CM

## 2024-07-29 DIAGNOSIS — R293 Abnormal posture: Secondary | ICD-10-CM

## 2024-07-29 NOTE — Therapy (Signed)
 " OUTPATIENT PHYSICAL THERAPY THORACOLUMBAR TREATMENT   Patient Name: Lindsay Wade MRN: [id number] DOB:01-03-1966, 59 y.o., female Today's Date: 07/29/2024  END OF SESSION:  PT End of Session - 07/29/24 1355     Visit Number 2    Date for Recertification  09/16/24    Authorization Type aetna    PT Start Time 1345    PT Stop Time 1425    PT Time Calculation (min) 40 min    Activity Tolerance Patient tolerated treatment well    Behavior During Therapy [facility] for tasks assessed/performed          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 - shave excision 03/31/2023   Dysplastic nevus 02/02/2023   moderate - shave excision 03/31/2023   Essential hypertension 04/15/2017   GERD (gastroesophageal reflux disease)    Hashimoto's thyroiditis    Headache    not lately   Hypothyroidism    MRSA infection    OSA (obstructive sleep apnea)    CPAP   PONV (postoperative nausea and vomiting)    after tonsillectomy   Past Surgical History:  Procedure Laterality Date   BTS     CARPAL TUNNEL RELEASE Left 11/20/2021   Procedure: LEFT CARPAL TUNNEL RELEASE;  Surgeon: Romona Harari, MD;  Location: [facility];  Service: Orthopedics;  Laterality: Left;   CESAREAN SECTION     COLONOSCOPY WITH PROPOFOL  N/A 03/15/2021   Procedure: COLONOSCOPY WITH PROPOFOL ;  Surgeon: Jinny Carmine, MD;  Location: [facility];  Service: Endoscopy;  Laterality: N/A;   ESOPHAGOGASTRODUODENOSCOPY (EGD) WITH PROPOFOL  N/A 03/15/2021   Procedure: ESOPHAGOGASTRODUODENOSCOPY (EGD) and dillation WITH PROPOFOL ;  Surgeon: Jinny Carmine, MD;  Location: [facility];  Service: Endoscopy;  Laterality: N/A;  sleep apnea requests early   ESOPHAGOGASTRODUODENOSCOPY (EGD) WITH PROPOFOL  N/A 12/04/2022   Procedure: ESOPHAGOGASTRODUODENOSCOPY (EGD) WITH PROPOFOL ;  Surgeon: Jinny Carmine, MD;  Location: [facility];  Service: Endoscopy;   Laterality: N/A;   TONSILLECTOMY     uterine ablation  2005   Patient Active Problem List   Diagnosis Date Noted   Abdominal bloating 01/26/2024   Dysphagia 01/26/2024   Acid reflux 01/26/2024   Gastro-esophageal reflux disease without esophagitis 01/26/2024   Morbid (severe) obesity due to excess calories (HCC) 01/26/2024   Low back pain 07/15/2023   Lumbar spondylosis 07/15/2023   Myofascial pain 07/15/2023   OSA treated with BiPAP 06/19/2023   Pure hypercholesterolemia 03/16/2023   Primary osteoarthritis 12/22/2022   Body mass index (BMI) 40.0-44.9, adult (HCC) 12/22/2022   Carpal tunnel syndrome, left upper limb    Screen for colon cancer    Polyp of sigmoid colon    Gastroesophageal reflux disease with esophagitis without hemorrhage    Joint synovitis 07/03/2020   Obstructive sleep apnea treated with BiPAP 10/06/2017   Occipital neuralgia of right side 06/03/2017   Uncontrolled morning headache 05/26/2017   HTN (hypertension), malignant 05/26/2017   Snoring 05/26/2017   Occipital headache 04/24/2017   Essential hypertension 04/15/2017   Hypothyroid     PCP: Frann Netter MD  REFERRING PROVIDER: Donaciano Sprang MD  REFERRING DIAG: M54.50 (ICD-10-CM) - Low back pain, unspecified back pain laterality, unspecified chronicity, unspecified whether sciatica present   Rationale for Evaluation and Treatment: Rehabilitation  THERAPY DIAG:  Pain in thoracic spine  Other low back pain  Abnormal posture  Muscle weakness (generalized)  ONSET DATE: few years  SUBJECTIVE:  SUBJECTIVE STATEMENT: Pt reports no new changes since evaluation.    Initial subjective: Dr Laqueta for past year.  Get injections every 3 month.  Last injection worked so well that I was able to walk 9000 steps  after ward. Saw Dr Burnetta and he said I need to lose weight.  I went to therapy about a year ago and she worked on my hips but it didn't work no reduction in pain.  PERTINENT HISTORY:  multiple trigger point injections in the lumbar spine, RFA's, and the L2-L3 and L3-L4 facet RFA as well as a right sided L2-L3 ESI injection.   PAIN:  Are you having pain? no: NPRS scale: 0/10 Pain location: right side low thoracic upper lumbar area Pain description: Aggravating factors: house cleaning forward flex repetition Relieving factors: sitting leaned forward,  PRECAUTIONS: None  RED FLAGS: None   WEIGHT BEARING RESTRICTIONS: No  FALLS:  Has patient fallen in last 6 months? No  LIVING ENVIRONMENT: Lives with: lives with their family Lives in: House/apartment Stairs: Yes: Internal: 16 steps; on right going up Has following equipment at home: None Carries a 22 LB dog both ways  OCCUPATION: house keeping  PLOF: Independent  PATIENT GOALS: to be able to walk without pain; lose weight, reduce pain  NEXT MD VISIT: March  OBJECTIVE:  Note: Objective measures were completed at Evaluation unless otherwise noted.  DIAGNOSTIC FINDINGS:  11/26 Her MRI does not demonstrate any significant spinal stenosis or significant structural abnormality that would warrant surgical intervention.   IMPRESSION: 2024:Mild foraminal stenosis bilaterally at L2-L3 and L3-L4 and on the left at L5-S1. Mild canal stenosis at L2-L3.  PATIENT SURVEYS:  ODI:14/50=28%  COGNITION: Overall cognitive status: Within functional limits for tasks assessed     SENSATION: WFL  MUSCLE LENGTH: Hamstrings: wfl   POSTURE: rounded shoulders and forward head  large breasts  PALPATION: Moderate TTP T&-T12 paraspinals  LUMBAR ROM:   WFL  LOWER EXTREMITY ROM:     wfl  LOWER EXTREMITY MMT:    MMT Right eval Left eval  Hip flexion 46.2 47.3  Hip extension    Hip abduction 44.6 43.8  Hip adduction    Hip  internal rotation    Hip external rotation    Knee flexion    Knee extension    Ankle dorsiflexion    Ankle plantarflexion    Ankle inversion    Ankle eversion     (Blank rows = not tested)  LUMBAR SPECIAL TESTS:  Straight leg raise test: Negative and Slump test: Negative  FUNCTIONAL TESTS:  5 times sit to stand: 9.80 Timed up and go (TUG): 7.33 4 stage balance PAssed  GAIT: Distance walked: 500 ft Assistive device utilized: Single point cane Level of assistance: Complete Independence Comments: normal  TREATMENT  [facility] Adult PT Treatment:                                             Date: 07/29/24 Pt seen for aquatic therapy today.  Treatment took place in water  3.5-4.75 ft in depth at the [facility] pool. Temp of water  was 91.  Pt entered/exited the pool via stairs independently with bil rail.  - Intro to aquatic therapy principles - unsupported walking forward/ backward -> with reciprocal arm swing - unsupported side stepping with arm add/abdct -> rainbow hand floats-> yellow hand floats - suitcase carry  marching forward/backward with bil/ single yellow hand float at side - UE on wall:  toe/heel raises x10; hip add/abd 2x10 ; hip flexion /extension 2x10 - TrA set with long hollow noodle pull down to thighs x 10 staggered stance, each LE forward -> solid noodle x 5-> thick yellow noodle x 5 in standard stance - straddling noodle without UE support: cycling -> white noodle under arms, behind back cycling  Pt requires the buoyancy and hydrostatic pressure of water  for support, and to offload joints by unweighting joint load by at least 50 % in navel deep water  and by at least 75-80% in chest to neck deep water .  Viscosity of the water  is needed for resistance of strengthening. Water  current perturbations provides challenge to standing balance requiring increased core activation.      PATIENT EDUCATION:  Education details: intro to aquatic therapy  Person educated:  Patient Education method: Explanation Education comprehension: verbalized understanding  HOME EXERCISE PROGRAM: Aquatics tba  ASSESSMENT:  CLINICAL IMPRESSION: Pt demonstrates safety and independence in aquatic setting with therapist instructing from deck. Pt is confident in setting, moving throughout all depths easily.  Pt is directed through various movement patterns in standing position with good tolerance.  Will assess response to session and begin to create aquatic HEP if pt anticipates access to a community pool.  Goals are ongoing.       Initial impression:  Patient is a 59 y.o. f who was seen today for physical therapy evaluation and treatment for back pain. She has had moderate to severe R sided midthoracic pain over the past few years which has progressed. She has seen pain management who recently gave her trigger point injections along mid to lower Rt paraspinals completely eliminating pain allowing her to walk on vacation 9000 steps.  She states some of the pain is slowly creeping back but currently low. Able to recreate pain with palpation in area noted above where she has elevated muscle mass likely due to spasm.  Md note does mention a scoliosis which may be a contributing/major factor but no other reference found no diagnostics.  She had PT last year without any positive results. She will benefit from skilled PT in aquatics and land for instruction on pain management, muscle strengthening, posture and body mechanic correction, manual techniques and HEP's. She is in agreement to complete 1 x week as she is coming from ~ 40 minutes drive away.  Feel as though aquatics will only require 2-4 sessions for instruction on HEP and direction for pain management if/when returns.  She vu of importance of completing stretching and exercises for forward management of chronic condition.     OBJECTIVE IMPAIRMENTS: decreased activity tolerance, decreased knowledge of condition, decreased  mobility, decreased strength, increased muscle spasms, postural dysfunction, obesity, and pain.   ACTIVITY LIMITATIONS: carrying and locomotion level  PARTICIPATION LIMITATIONS: shopping, community activity, and yard work  PERSONAL FACTORS: Time since onset of injury/illness/exacerbation are also affecting patient's functional outcome.   REHAB POTENTIAL: Good  CLINICAL DECISION MAKING: Stable/uncomplicated  EVALUATION COMPLEXITY: Low   GOALS: Goals reviewed with patient? Yes  SHORT TERM GOALS: Target date: 08/16/24  Pt will tolerate full aquatic sessions consistently without increase in pain and with improving function to demonstrate good toleration and effectiveness of intervention.  Baseline: Goal status: INITIAL  2.  Pt will be indep with final HEP aquatic for continued management of condition. Baseline:  Goal status: INITIAL   LONG TERM GOALS: Target date: 09/16/24  Pt  to improve on ODI to15% to demonstrate statistically significant Improvement in function. (MCID 13-15%) Baseline: 14/50=28% Goal status: INITIAL  2.  Pt will perform house cleaning with fewer rest periods (<3) and fewer doses od ibuprofen. Baseline:  Goal status: INITIAL  3.  Pt will report ability to tolerate amb for over 1 hour without limitation to pain Baseline:  Goal status: INITIAL  4.  Pt will report pain no > 4/10 for improved toleration to activity/quality of life and to demonstrate improved management of pain. Baseline: 2/10 but gradually returning Goal status: INITIAL  5.  Pt will be indep with final HEP for continued management of condition Baseline:  Goal status: INITIAL   PLAN:  PT FREQUENCY: 1-2x/week  PT DURATION: 8 weeks  PLANNED INTERVENTIONS: 97164- PT Re-evaluation, 97750- Physical Performance Testing, 97110-Therapeutic exercises, 97530- Therapeutic activity, 97112- Neuromuscular re-education, 97535- Self Care, 02859- Manual therapy, [phone]- Gait training, [phone]- Aquatic  Therapy, [phone]- Electrical stimulation (unattended), [phone]- Electrical stimulation (manual), [id number]- Ionotophoresis 4mg /ml Dexamethasone, 20560 (1-2 muscles), 20561 (3+ muscles)- Dry Needling, Patient/Family education, Balance training, Stair training, Taping, Joint mobilization, DME instructions, Cryotherapy, and Moist heat.  PLAN FOR NEXT SESSION: Land :manual techniques as approp  Delon Aquas, PTA 07/29/24 2:47 PM [facility] [address] [place], [place], [address] Phone: [phone]   Fax:  [phone]  "

## 2024-08-05 ENCOUNTER — Ambulatory Visit (HOSPITAL_BASED_OUTPATIENT_CLINIC_OR_DEPARTMENT_OTHER): Admitting: Physical Therapy

## 2024-08-05 ENCOUNTER — Encounter (HOSPITAL_BASED_OUTPATIENT_CLINIC_OR_DEPARTMENT_OTHER): Payer: Self-pay | Admitting: Physical Therapy

## 2024-08-05 DIAGNOSIS — M546 Pain in thoracic spine: Secondary | ICD-10-CM | POA: Diagnosis not present

## 2024-08-05 DIAGNOSIS — M5459 Other low back pain: Secondary | ICD-10-CM

## 2024-08-05 DIAGNOSIS — M6281 Muscle weakness (generalized): Secondary | ICD-10-CM

## 2024-08-05 DIAGNOSIS — R293 Abnormal posture: Secondary | ICD-10-CM

## 2024-08-05 NOTE — Therapy (Signed)
 " OUTPATIENT PHYSICAL THERAPY THORACOLUMBAR TREATMENT   Patient Name: Lindsay Wade MRN: [id number] DOB:15-Feb-1966, 59 y.o., female Today's Date: 08/05/2024  END OF SESSION:  PT End of Session - 08/05/24 1359     Visit Number 3    Date for Recertification  09/16/24    Authorization Type aetna    PT Start Time 1345    PT Stop Time 1430    PT Time Calculation (min) 45 min    Activity Tolerance Patient tolerated treatment well    Behavior During Therapy [facility] for tasks assessed/performed          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 - shave excision 03/31/2023   Dysplastic nevus 02/02/2023   moderate - shave excision 03/31/2023   Essential hypertension 04/15/2017   GERD (gastroesophageal reflux disease)    Hashimoto's thyroiditis    Headache    not lately   Hypothyroidism    MRSA infection    OSA (obstructive sleep apnea)    CPAP   PONV (postoperative nausea and vomiting)    after tonsillectomy   Past Surgical History:  Procedure Laterality Date   BTS     CARPAL TUNNEL RELEASE Left 11/20/2021   Procedure: LEFT CARPAL TUNNEL RELEASE;  Surgeon: Romona Harari, MD;  Location: [facility];  Service: Orthopedics;  Laterality: Left;   CESAREAN SECTION     COLONOSCOPY WITH PROPOFOL  N/A 03/15/2021   Procedure: COLONOSCOPY WITH PROPOFOL ;  Surgeon: Jinny Carmine, MD;  Location: [facility];  Service: Endoscopy;  Laterality: N/A;   ESOPHAGOGASTRODUODENOSCOPY (EGD) WITH PROPOFOL  N/A 03/15/2021   Procedure: ESOPHAGOGASTRODUODENOSCOPY (EGD) and dillation WITH PROPOFOL ;  Surgeon: Jinny Carmine, MD;  Location: [facility];  Service: Endoscopy;  Laterality: N/A;  sleep apnea requests early   ESOPHAGOGASTRODUODENOSCOPY (EGD) WITH PROPOFOL  N/A 12/04/2022   Procedure: ESOPHAGOGASTRODUODENOSCOPY (EGD) WITH PROPOFOL ;  Surgeon: Jinny Carmine, MD;  Location: [facility];  Service: Endoscopy;   Laterality: N/A;   TONSILLECTOMY     uterine ablation  2005   Patient Active Problem List   Diagnosis Date Noted   Abdominal bloating 01/26/2024   Dysphagia 01/26/2024   Acid reflux 01/26/2024   Gastro-esophageal reflux disease without esophagitis 01/26/2024   Morbid (severe) obesity due to excess calories (HCC) 01/26/2024   Low back pain 07/15/2023   Lumbar spondylosis 07/15/2023   Myofascial pain 07/15/2023   OSA treated with BiPAP 06/19/2023   Pure hypercholesterolemia 03/16/2023   Primary osteoarthritis 12/22/2022   Body mass index (BMI) 40.0-44.9, adult (HCC) 12/22/2022   Carpal tunnel syndrome, left upper limb    Screen for colon cancer    Polyp of sigmoid colon    Gastroesophageal reflux disease with esophagitis without hemorrhage    Joint synovitis 07/03/2020   Obstructive sleep apnea treated with BiPAP 10/06/2017   Occipital neuralgia of right side 06/03/2017   Uncontrolled morning headache 05/26/2017   HTN (hypertension), malignant 05/26/2017   Snoring 05/26/2017   Occipital headache 04/24/2017   Essential hypertension 04/15/2017   Hypothyroid     PCP: Frann Netter MD  REFERRING PROVIDER: Donaciano Sprang MD  REFERRING DIAG: M54.50 (ICD-10-CM) - Low back pain, unspecified back pain laterality, unspecified chronicity, unspecified whether sciatica present   Rationale for Evaluation and Treatment: Rehabilitation  THERAPY DIAG:  Pain in thoracic spine  Other low back pain  Abnormal posture  Muscle weakness (generalized)  ONSET DATE: few years  SUBJECTIVE:  SUBJECTIVE STATEMENT: Pt reports she had no increased soreness after initial session. Pain is still there, just enough to feel it.    Initial subjective: Dr Laqueta for past year.  Get injections every 3 month.   Last injection worked so well that I was able to walk 9000 steps after ward. Saw Dr Burnetta and he said I need to lose weight.  I went to therapy about a year ago and she worked on my hips but it didn't work no reduction in pain.  PERTINENT HISTORY:  multiple trigger point injections in the lumbar spine, RFA's, and the L2-L3 and L3-L4 facet RFA as well as a right sided L2-L3 ESI injection.   PAIN:  Are you having pain? yes: NPRS scale: 2/10 Pain location: right side low thoracic upper lumbar area Pain description:just enough to know it is there Aggravating factors: house cleaning forward flex repetition Relieving factors: sitting leaned forward,  PRECAUTIONS: None  RED FLAGS: None   WEIGHT BEARING RESTRICTIONS: No  FALLS:  Has patient fallen in last 6 months? No  LIVING ENVIRONMENT: Lives with: lives with their family Lives in: House/apartment Stairs: Yes: Internal: 16 steps; on right going up Has following equipment at home: None Carries a 22 LB dog both ways  OCCUPATION: house keeping  PLOF: Independent  PATIENT GOALS: to be able to walk without pain; lose weight, reduce pain  NEXT MD VISIT: March  OBJECTIVE:  Note: Objective measures were completed at Evaluation unless otherwise noted.  DIAGNOSTIC FINDINGS:  11/26 Her MRI does not demonstrate any significant spinal stenosis or significant structural abnormality that would warrant surgical intervention.   IMPRESSION: 2024:Mild foraminal stenosis bilaterally at L2-L3 and L3-L4 and on the left at L5-S1. Mild canal stenosis at L2-L3.  PATIENT SURVEYS:  ODI:14/50=28%  COGNITION: Overall cognitive status: Within functional limits for tasks assessed     SENSATION: WFL  MUSCLE LENGTH: Hamstrings: wfl   POSTURE: rounded shoulders and forward head  large breasts  PALPATION: Moderate TTP T&-T12 paraspinals  LUMBAR ROM:   WFL  LOWER EXTREMITY ROM:     wfl  LOWER EXTREMITY MMT:    MMT Right eval  Left eval  Hip flexion 46.2 47.3  Hip extension    Hip abduction 44.6 43.8  Hip adduction    Hip internal rotation    Hip external rotation    Knee flexion    Knee extension    Ankle dorsiflexion    Ankle plantarflexion    Ankle inversion    Ankle eversion     (Blank rows = not tested)  LUMBAR SPECIAL TESTS:  Straight leg raise test: Negative and Slump test: Negative  FUNCTIONAL TESTS:  5 times sit to stand: 9.80 Timed up and go (TUG): 7.33 4 stage balance PAssed  GAIT: Distance walked: 500 ft Assistive device utilized: Single point cane Level of assistance: Complete Independence Comments: normal  TREATMENT  [facility] Adult PT Treatment:                                             Date: 07/29/24 Pt seen for aquatic therapy today.  Treatment took place in water  3.5-4.75 ft in depth at the [facility] pool. Temp of water  was 91.  Pt entered/exited the pool via stairs independently with bil rail.  - self care: pt instructed in self massage to thoracic/lumbar musculature with ball; pt returned  demo - unsupported walking forward/ backwardwith reciprocal arm swing - unsupported side stepping with arm add/abdct yellow hand floats-> horiz abdct/ add - suitcase carry marching forward/backward with bil/ single yellow hand float at side - TrA set with same side/opposite knee taps to yellow hand floats with backward/forward marching - open book next to wall x 5 each side - hip hinge with forward arm reach x 8 -> single leg superman x 5 each side - UE on yellow hand floats: toe/heel raises x5; hip add/abd 2x10 ; hip flexion /extension 2x10 - staggered stance with kick board row x 8 each leg forward; with vectors x 5 each leg forward - wall push up/off x 10 - plank with UE on blue hand floats and alternating hip extension - once pt dried off - applied reg Rock tape to pt's Rt midlumbar, at area of sensitivity with 20% stretch to decompress tissue and assist with desensitizing area.    Pt requires the buoyancy and hydrostatic pressure of water  for support, and to offload joints by unweighting joint load by at least 50 % in navel deep water  and by at least 75-80% in chest to neck deep water .  Viscosity of the water  is needed for resistance of strengthening. Water  current perturbations provides challenge to standing balance requiring increased core activation.      PATIENT EDUCATION:  Education details: intro to aquatic therapy  Person educated: Patient Education method: Explanation Education comprehension: verbalized understanding  HOME EXERCISE PROGRAM: AQUATIC Access Code: [email or web address] URL: [email or web address] Date: 08/05/2024 * not issued yet.   ASSESSMENT:  CLINICAL IMPRESSION: Positive response to last aquatic session. Trialed additional core / back strengthening exercises.  Some irritation reported with hip hinge/forward arm reach.   Will assess response to ktape application and begin instruction of aquatic HEP. Goals are ongoing.       Initial impression:  Patient is a 58 y.o. f who was seen today for physical therapy evaluation and treatment for back pain. She has had moderate to severe R sided midthoracic pain over the past few years which has progressed. She has seen pain management who recently gave her trigger point injections along mid to lower Rt paraspinals completely eliminating pain allowing her to walk on vacation 9000 steps.  She states some of the pain is slowly creeping back but currently low. Able to recreate pain with palpation in area noted above where she has elevated muscle mass likely due to spasm.  Md note does mention a scoliosis which may be a contributing/major factor but no other reference found no diagnostics.  She had PT last year without any positive results. She will benefit from skilled PT in aquatics and land for instruction on pain management, muscle strengthening, posture and body mechanic correction, manual  techniques and HEP's. She is in agreement to complete 1 x week as she is coming from ~ 40 minutes drive away.  Feel as though aquatics will only require 2-4 sessions for instruction on HEP and direction for pain management if/when returns.  She vu of importance of completing stretching and exercises for forward management of chronic condition.     OBJECTIVE IMPAIRMENTS: decreased activity tolerance, decreased knowledge of condition, decreased mobility, decreased strength, increased muscle spasms, postural dysfunction, obesity, and pain.   ACTIVITY LIMITATIONS: carrying and locomotion level  PARTICIPATION LIMITATIONS: shopping, community activity, and yard work  PERSONAL FACTORS: Time since onset of injury/illness/exacerbation are also affecting patient's functional outcome.   REHAB POTENTIAL: Good  CLINICAL DECISION MAKING: Stable/uncomplicated  EVALUATION COMPLEXITY: Low   GOALS: Goals reviewed with patient? Yes  SHORT TERM GOALS: Target date: 08/16/24  Pt will tolerate full aquatic sessions consistently without increase in pain and with improving function to demonstrate good toleration and effectiveness of intervention.  Baseline: Goal status: MET- 08/05/24  2.  Pt will be indep with final HEP aquatic for continued management of condition. Baseline:  Goal status: INITIAL   LONG TERM GOALS: Target date: 09/16/24  Pt to improve on ODI to15% to demonstrate statistically significant Improvement in function. (MCID 13-15%) Baseline: 14/50=28% Goal status: INITIAL  2.  Pt will perform house cleaning with fewer rest periods (<3) and fewer doses od ibuprofen. Baseline:  Goal status: INITIAL  3.  Pt will report ability to tolerate amb for over 1 hour without limitation to pain Baseline:  Goal status: INITIAL  4.  Pt will report pain no > 4/10 for improved toleration to activity/quality of life and to demonstrate improved management of pain. Baseline: 2/10 but gradually  returning Goal status: INITIAL  5.  Pt will be indep with final HEP for continued management of condition Baseline:  Goal status: INITIAL   PLAN:  PT FREQUENCY: 1-2x/week  PT DURATION: 8 weeks  PLANNED INTERVENTIONS: 97164- PT Re-evaluation, 97750- Physical Performance Testing, 97110-Therapeutic exercises, 97530- Therapeutic activity, 97112- Neuromuscular re-education, 97535- Self Care, [id number]- Manual therapy, [phone]- Gait training, [phone]- Aquatic Therapy, [phone]- Electrical stimulation (unattended), [phone]- Electrical stimulation (manual), [id number]- Ionotophoresis 4mg /ml Dexamethasone, 79439 (1-2 muscles), 20561 (3+ muscles)- Dry Needling, Patient/Family education, Balance training, Stair training, Taping, Joint mobilization, DME instructions, Cryotherapy, and Moist heat.  PLAN FOR NEXT SESSION: Land :manual techniques as approp  Delon Aquas, PTA 08/05/24 4:07 PM [facility] [address] [place], [place], [address] Phone: [phone]   Fax:  [phone]  "

## 2024-08-12 ENCOUNTER — Ambulatory Visit (HOSPITAL_BASED_OUTPATIENT_CLINIC_OR_DEPARTMENT_OTHER): Attending: Orthopedic Surgery | Admitting: Physical Therapy

## 2024-08-12 ENCOUNTER — Encounter (HOSPITAL_BASED_OUTPATIENT_CLINIC_OR_DEPARTMENT_OTHER): Payer: Self-pay | Admitting: Physical Therapy

## 2024-08-12 DIAGNOSIS — R293 Abnormal posture: Secondary | ICD-10-CM

## 2024-08-12 DIAGNOSIS — M6281 Muscle weakness (generalized): Secondary | ICD-10-CM

## 2024-08-12 DIAGNOSIS — M546 Pain in thoracic spine: Secondary | ICD-10-CM

## 2024-08-12 DIAGNOSIS — M5459 Other low back pain: Secondary | ICD-10-CM

## 2024-08-12 NOTE — Therapy (Signed)
 " OUTPATIENT PHYSICAL THERAPY THORACOLUMBAR TREATMENT   Patient Name: Lindsay Wade MRN: [id number] DOB:[birth year], 59 y.o., female Today's Date: 08/12/2024  END OF SESSION:  PT End of Session - 08/12/24 1408     Visit Number 4    Date for Recertification  09/16/24    Authorization Type aetna    PT Start Time 1345    PT Stop Time 1425    PT Time Calculation (min) 40 min    Activity Tolerance Patient tolerated treatment well    Behavior During Therapy [facility] for tasks assessed/performed          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 - shave excision 03/31/2023   Dysplastic nevus 02/02/2023   moderate - shave excision 03/31/2023   Essential hypertension 04/15/2017   GERD (gastroesophageal reflux disease)    Hashimoto's thyroiditis    Headache    not lately   Hypothyroidism    MRSA infection    OSA (obstructive sleep apnea)    CPAP   PONV (postoperative nausea and vomiting)    after tonsillectomy   Past Surgical History:  Procedure Laterality Date   BTS     CARPAL TUNNEL RELEASE Left 11/20/2021   Procedure: LEFT CARPAL TUNNEL RELEASE;  Surgeon: Romona Harari, MD;  Location: [facility];  Service: Orthopedics;  Laterality: Left;   CESAREAN SECTION     COLONOSCOPY WITH PROPOFOL  N/A 03/15/2021   Procedure: COLONOSCOPY WITH PROPOFOL ;  Surgeon: Jinny Carmine, MD;  Location: [facility];  Service: Endoscopy;  Laterality: N/A;   ESOPHAGOGASTRODUODENOSCOPY (EGD) WITH PROPOFOL  N/A 03/15/2021   Procedure: ESOPHAGOGASTRODUODENOSCOPY (EGD) and dillation WITH PROPOFOL ;  Surgeon: Jinny Carmine, MD;  Location: [facility];  Service: Endoscopy;  Laterality: N/A;  sleep apnea requests early   ESOPHAGOGASTRODUODENOSCOPY (EGD) WITH PROPOFOL  N/A 12/04/2022   Procedure: ESOPHAGOGASTRODUODENOSCOPY (EGD) WITH PROPOFOL ;  Surgeon: Jinny Carmine, MD;  Location: [facility];  Service: Endoscopy;   Laterality: N/A;   TONSILLECTOMY     uterine ablation  2005   Patient Active Problem List   Diagnosis Date Noted   Abdominal bloating 01/26/2024   Dysphagia 01/26/2024   Acid reflux 01/26/2024   Gastro-esophageal reflux disease without esophagitis 01/26/2024   Morbid (severe) obesity due to excess calories (HCC) 01/26/2024   Low back pain 07/15/2023   Lumbar spondylosis 07/15/2023   Myofascial pain 07/15/2023   OSA treated with BiPAP 06/19/2023   Pure hypercholesterolemia 03/16/2023   Primary osteoarthritis 12/22/2022   Body mass index (BMI) 40.0-44.9, adult (HCC) 12/22/2022   Carpal tunnel syndrome, left upper limb    Screen for colon cancer    Polyp of sigmoid colon    Gastroesophageal reflux disease with esophagitis without hemorrhage    Joint synovitis 07/03/2020   Obstructive sleep apnea treated with BiPAP 10/06/2017   Occipital neuralgia of right side 06/03/2017   Uncontrolled morning headache 05/26/2017   HTN (hypertension), malignant 05/26/2017   Snoring 05/26/2017   Occipital headache 04/24/2017   Essential hypertension 04/15/2017   Hypothyroid     PCP: Frann Netter MD  REFERRING PROVIDER: Donaciano Sprang MD  REFERRING DIAG: M54.50 (ICD-10-CM) - Low back pain, unspecified back pain laterality, unspecified chronicity, unspecified whether sciatica present   Rationale for Evaluation and Treatment: Rehabilitation  THERAPY DIAG:  Pain in thoracic spine  Other low back pain  Abnormal posture  Muscle weakness (generalized)  ONSET DATE: few years  SUBJECTIVE:  SUBJECTIVE STATEMENT: Pt reports she had no increased soreness after last session. Pt reports the tape helped, but rolled off the night of application. Pt doesn't want to be retaped, but may buy some and retape herself.     Initial subjective: Dr Laqueta for past year.  Get injections every 3 month.  Last injection worked so well that I was able to walk 9000 steps after ward. Saw Dr Burnetta and he said I need to lose weight.  I went to therapy about a year ago and she worked on my hips but it didn't work no reduction in pain.  PERTINENT HISTORY:  multiple trigger point injections in the lumbar spine, RFA's, and the L2-L3 and L3-L4 facet RFA as well as a right sided L2-L3 ESI injection.   PAIN:  Are you having pain? yes: NPRS scale: 3/10 Pain location: right side low thoracic upper lumbar area Pain description:just enough to know it is there Aggravating factors: house cleaning forward flex repetition Relieving factors: sitting leaned forward,  PRECAUTIONS: None  RED FLAGS: None   WEIGHT BEARING RESTRICTIONS: No  FALLS:  Has patient fallen in last 6 months? No  LIVING ENVIRONMENT: Lives with: lives with their family Lives in: House/apartment Stairs: Yes: Internal: 16 steps; on right going up Has following equipment at home: None Carries a 22 LB dog both ways  OCCUPATION: house keeping  PLOF: Independent  PATIENT GOALS: to be able to walk without pain; lose weight, reduce pain  NEXT MD VISIT: March  OBJECTIVE:  Note: Objective measures were completed at Evaluation unless otherwise noted.  DIAGNOSTIC FINDINGS:  11/26 Her MRI does not demonstrate any significant spinal stenosis or significant structural abnormality that would warrant surgical intervention.   IMPRESSION: 2024:Mild foraminal stenosis bilaterally at L2-L3 and L3-L4 and on the left at L5-S1. Mild canal stenosis at L2-L3.  PATIENT SURVEYS:  ODI:14/50=28%  COGNITION: Overall cognitive status: Within functional limits for tasks assessed     SENSATION: WFL  MUSCLE LENGTH: Hamstrings: wfl   POSTURE: rounded shoulders and forward head  large breasts  PALPATION: Moderate TTP T&-T12 paraspinals  LUMBAR ROM:    WFL  LOWER EXTREMITY ROM:     wfl  LOWER EXTREMITY MMT:    MMT Right eval Left eval  Hip flexion 46.2 47.3  Hip extension    Hip abduction 44.6 43.8  Hip adduction    Hip internal rotation    Hip external rotation    Knee flexion    Knee extension    Ankle dorsiflexion    Ankle plantarflexion    Ankle inversion    Ankle eversion     (Blank rows = not tested)  LUMBAR SPECIAL TESTS:  Straight leg raise test: Negative and Slump test: Negative  FUNCTIONAL TESTS:  5 times sit to stand: 9.80 Timed up and go (TUG): 7.33 4 stage balance PAssed  GAIT: Distance walked: 500 ft Assistive device utilized: Single point cane Level of assistance: Complete Independence Comments: normal  TREATMENT  [facility] Adult PT Treatment:                                             Date: 08/12/24 Exercises - unsupported walking forward / backward  -side stepping with arm add/abdct with yellow hand floats -suitcase carry with single hand float at side (or on both sides), walking forward/ backward  -  - Open  Book. Stand  near wall.   Head follows hand for spinal twist.  10 reps - Forward Backward Leg Swing - hold wall or hand floats  - 10 reps - Leg Swings Side to Side - hold wall or hand floats 10 reps - Warrior III in [place]  with Pool Noodle 10 reps - Standing Noodle Stomp (Gentle)/ with or without hand on wall 10 reps - Single Arm Shoulder Extension with Anchored Resistance  10 reps - Plank with hands on bench or hand floats with SMALL leg lift   10 reps-> opp arm/leg reach - 3 way leg stretch (inner thigh, hamstring, outer thigh) with back against wall. ankle on pool noodle - 15-30 seconds  hold  [facility] Adult PT Treatment:                                             Date: 07/29/24 Pt seen for aquatic therapy today.  Treatment took place in water  3.5-4.75 ft in depth at the [facility] pool. Temp of water  was 91.  Pt entered/exited the pool via stairs independently with bil  rail.  - self care: pt instructed in self massage to thoracic/lumbar musculature with ball; pt returned demo - unsupported walking forward/ backwardwith reciprocal arm swing - unsupported side stepping with arm add/abdct yellow hand floats-> horiz abdct/ add - suitcase carry marching forward/backward with bil/ single yellow hand float at side - TrA set with same side/opposite knee taps to yellow hand floats with backward/forward marching - open book next to wall x 5 each side - hip hinge with forward arm reach x 8 -> single leg superman x 5 each side - UE on yellow hand floats: toe/heel raises x5; hip add/abd 2x10 ; hip flexion /extension 2x10 - staggered stance with kick board row x 8 each leg forward; with vectors x 5 each leg forward - wall push up/off x 10 - plank with UE on blue hand floats and alternating hip extension - once pt dried off - applied reg Rock tape to pt's Rt midlumbar, at area of sensitivity with 20% stretch to decompress tissue and assist with desensitizing area.   Pt requires the buoyancy and hydrostatic pressure of water  for support, and to offload joints by unweighting joint load by at least 50 % in navel deep water  and by at least 75-80% in chest to neck deep water .  Viscosity of the water  is needed for resistance of strengthening. Water  current perturbations provides challenge to standing balance requiring increased core activation.      PATIENT EDUCATION:  Education details: intro to aquatic therapy ; aquatic HEP Person educated: Patient Education method: Explanation, handout Education comprehension: verbalized understanding  HOME EXERCISE PROGRAM: AQUATIC Access Code: [email or web address] URL: [email or web address] This aquatic home exercise program from Med[employer] utilizes pictures from land based exercises, but has been adapted prior to lamination and issuance.    ASSESSMENT:  CLINICAL IMPRESSION: Pt with some relief with ktape application to back,  but declined for reapplication.  Good tolerance for exercises performed today; no production of pain.  Pt instructed on aquatic HEP and issued laminated copy.  Pt has met STG2. Pt has reached their max potential in aquatic setting and is ready to fully transition to land intervention.        Initial impression:  Patient is a 59 y.o. f who was seen today for physical therapy  evaluation and treatment for back pain. She has had moderate to severe R sided midthoracic pain over the past few years which has progressed. She has seen pain management who recently gave her trigger point injections along mid to lower Rt paraspinals completely eliminating pain allowing her to walk on vacation 9000 steps.  She states some of the pain is slowly creeping back but currently low. Able to recreate pain with palpation in area noted above where she has elevated muscle mass likely due to spasm.  Md note does mention a scoliosis which may be a contributing/major factor but no other reference found no diagnostics.  She had PT last year without any positive results. She will benefit from skilled PT in aquatics and land for instruction on pain management, muscle strengthening, posture and body mechanic correction, manual techniques and HEP's. She is in agreement to complete 1 x week as she is coming from ~ 40 minutes drive away.  Feel as though aquatics will only require 2-4 sessions for instruction on HEP and direction for pain management if/when returns.  She vu of importance of completing stretching and exercises for forward management of chronic condition.     OBJECTIVE IMPAIRMENTS: decreased activity tolerance, decreased knowledge of condition, decreased mobility, decreased strength, increased muscle spasms, postural dysfunction, obesity, and pain.   ACTIVITY LIMITATIONS: carrying and locomotion level  PARTICIPATION LIMITATIONS: shopping, community activity, and yard work  PERSONAL FACTORS: Time since onset of  injury/illness/exacerbation are also affecting patient's functional outcome.   REHAB POTENTIAL: Good  CLINICAL DECISION MAKING: Stable/uncomplicated  EVALUATION COMPLEXITY: Low   GOALS: Goals reviewed with patient? Yes  SHORT TERM GOALS: Target date: 08/16/24  Pt will tolerate full aquatic sessions consistently without increase in pain and with improving function to demonstrate good toleration and effectiveness of intervention.  Baseline: Goal status: MET- 08/05/24  2.  Pt will be indep with final HEP aquatic for continued management of condition. Baseline:  Goal status:MET- 08/12/24   LONG TERM GOALS: Target date: 09/16/24  Pt to improve on ODI to15% to demonstrate statistically significant Improvement in function. (MCID 13-15%) Baseline: 14/50=28% Goal status: INITIAL  2.  Pt will perform house cleaning with fewer rest periods (<3) and fewer doses od ibuprofen. Baseline:  Goal status: INITIAL  3.  Pt will report ability to tolerate amb for over 1 hour without limitation to pain Baseline:  Goal status: INITIAL  4.  Pt will report pain no > 4/10 for improved toleration to activity/quality of life and to demonstrate improved management of pain. Baseline: 2/10 but gradually returning Goal status: INITIAL  5.  Pt will be indep with final HEP for continued management of condition Baseline:  Goal status: INITIAL   PLAN:  PT FREQUENCY: 1-2x/week  PT DURATION: 8 weeks  PLANNED INTERVENTIONS: 97164- PT Re-evaluation, 97750- Physical Performance Testing, 97110-Therapeutic exercises, 97530- Therapeutic activity, 97112- Neuromuscular re-education, 97535- Self Care, 02859- Manual therapy, [phone]- Gait training, [phone]- Aquatic Therapy, [phone]- Electrical stimulation (unattended), [phone]- Electrical stimulation (manual), [id number]- Ionotophoresis 4mg /ml Dexamethasone, 20560 (1-2 muscles), 20561 (3+ muscles)- Dry Needling, Patient/Family education, Balance training, Stair training, Taping,  Joint mobilization, DME instructions, Cryotherapy, and Moist heat.  PLAN FOR NEXT SESSION: Land :manual techniques as approp; functional strengthening  Delon Aquas, PTA 08/12/24 3:50 PM [facility] [address] [place], [place], [address] Phone: [phone]   Fax:  [phone]  "

## 2024-08-19 ENCOUNTER — Encounter (HOSPITAL_BASED_OUTPATIENT_CLINIC_OR_DEPARTMENT_OTHER)

## 2024-08-26 ENCOUNTER — Encounter (HOSPITAL_BASED_OUTPATIENT_CLINIC_OR_DEPARTMENT_OTHER)

## 2024-09-09 ENCOUNTER — Encounter (HOSPITAL_BASED_OUTPATIENT_CLINIC_OR_DEPARTMENT_OTHER): Admitting: Physical Therapy

## 2024-09-16 ENCOUNTER — Encounter (HOSPITAL_BASED_OUTPATIENT_CLINIC_OR_DEPARTMENT_OTHER): Admitting: Physical Therapy

## 2025-01-26 ENCOUNTER — Ambulatory Visit: Admitting: Dermatology

## 2025-06-20 ENCOUNTER — Telehealth: Admitting: Family Medicine
